# Patient Record
Sex: Male | Born: 1950 | Race: White | Hispanic: No | Smoking: Current every day smoker
Health system: Southern US, Community
[De-identification: ages and names within clinical notes are randomized; demographics above are authoritative.]

## PROBLEM LIST (undated history)

## (undated) DIAGNOSIS — F329 Major depressive disorder, single episode, unspecified: Secondary | ICD-10-CM

## (undated) DIAGNOSIS — C19 Malignant neoplasm of rectosigmoid junction: Secondary | ICD-10-CM

## (undated) DIAGNOSIS — K635 Polyp of colon: Secondary | ICD-10-CM

## (undated) DIAGNOSIS — K769 Liver disease, unspecified: Secondary | ICD-10-CM

## (undated) DIAGNOSIS — T7840XA Allergy, unspecified, initial encounter: Secondary | ICD-10-CM

## (undated) DIAGNOSIS — K7460 Unspecified cirrhosis of liver: Secondary | ICD-10-CM

## (undated) DIAGNOSIS — K5790 Diverticulosis of intestine, part unspecified, without perforation or abscess without bleeding: Secondary | ICD-10-CM

## (undated) DIAGNOSIS — F32A Depression, unspecified: Secondary | ICD-10-CM

## (undated) DIAGNOSIS — M1990 Unspecified osteoarthritis, unspecified site: Secondary | ICD-10-CM

## (undated) HISTORY — PX: HERNIA REPAIR: SHX51

## (undated) HISTORY — PX: COLON SURGERY: SHX602

## (undated) HISTORY — DX: Diverticulosis of intestine, part unspecified, without perforation or abscess without bleeding: K57.90

## (undated) HISTORY — DX: Depression, unspecified: F32.A

## (undated) HISTORY — DX: Major depressive disorder, single episode, unspecified: F32.9

## (undated) HISTORY — DX: Unspecified osteoarthritis, unspecified site: M19.90

## (undated) HISTORY — DX: Allergy, unspecified, initial encounter: T78.40XA

## (undated) HISTORY — DX: Polyp of colon: K63.5

---

## 2005-01-05 ENCOUNTER — Inpatient Hospital Stay: Payer: Self-pay | Admitting: Psychiatry

## 2005-08-14 ENCOUNTER — Emergency Department: Payer: Self-pay | Admitting: General Practice

## 2005-08-14 ENCOUNTER — Inpatient Hospital Stay: Payer: Self-pay | Admitting: Psychiatry

## 2005-09-05 ENCOUNTER — Other Ambulatory Visit: Payer: Self-pay

## 2005-09-05 ENCOUNTER — Emergency Department: Payer: Self-pay | Admitting: Emergency Medicine

## 2005-09-17 ENCOUNTER — Emergency Department: Payer: Self-pay | Admitting: Emergency Medicine

## 2006-05-13 ENCOUNTER — Emergency Department: Payer: Self-pay | Admitting: General Practice

## 2006-08-05 ENCOUNTER — Emergency Department: Payer: Self-pay | Admitting: Emergency Medicine

## 2006-08-21 ENCOUNTER — Other Ambulatory Visit: Payer: Self-pay

## 2006-08-21 ENCOUNTER — Inpatient Hospital Stay: Payer: Self-pay | Admitting: Internal Medicine

## 2007-04-06 ENCOUNTER — Emergency Department: Payer: Self-pay | Admitting: Emergency Medicine

## 2008-07-23 ENCOUNTER — Ambulatory Visit: Payer: Self-pay

## 2010-01-20 ENCOUNTER — Ambulatory Visit: Payer: Self-pay | Admitting: Gastroenterology

## 2010-01-22 ENCOUNTER — Ambulatory Visit: Payer: Self-pay | Admitting: Gastroenterology

## 2010-01-22 LAB — PATHOLOGY REPORT

## 2010-01-25 ENCOUNTER — Ambulatory Visit: Payer: Self-pay | Admitting: Gastroenterology

## 2012-01-02 ENCOUNTER — Ambulatory Visit: Payer: Self-pay | Admitting: Family Medicine

## 2013-08-08 LAB — HM COLONOSCOPY

## 2016-01-06 ENCOUNTER — Telehealth: Payer: Self-pay | Admitting: Family Medicine

## 2016-01-06 NOTE — Telephone Encounter (Signed)
Pt's sister Florentina Jenny would like to see if Dr. [name] can re-establish pt. Pt had moved to [place] and has now moved back. Pt went to hospital in [place] for fluid build up in abdomen and SOB. Pt has Medicare and [employer]. Please advise. Thanks TNP

## 2016-01-06 NOTE — Telephone Encounter (Signed)
Please advise 

## 2016-01-06 NOTE — Telephone Encounter (Signed)
OK to re-establish 

## 2016-01-06 NOTE — Telephone Encounter (Signed)
Spoke with Andrew Carey and scheduled appt for pt to come in for hospital f/u and re-establish. Thanks TNP

## 2016-01-09 ENCOUNTER — Encounter: Payer: Self-pay | Admitting: Urgent Care

## 2016-01-09 ENCOUNTER — Observation Stay
Admission: EM | Admit: 2016-01-09 | Discharge: 2016-01-11 | Disposition: A | Discharge status: Home / Self Care | Payer: Medicare Other | Attending: Specialist | Admitting: Specialist

## 2016-01-09 DIAGNOSIS — R0902 Hypoxemia: Secondary | ICD-10-CM | POA: Diagnosis not present

## 2016-01-09 DIAGNOSIS — Z8673 Personal history of transient ischemic attack (TIA), and cerebral infarction without residual deficits: Secondary | ICD-10-CM | POA: Insufficient documentation

## 2016-01-09 DIAGNOSIS — Z85038 Personal history of other malignant neoplasm of large intestine: Secondary | ICD-10-CM | POA: Insufficient documentation

## 2016-01-09 DIAGNOSIS — Z885 Allergy status to narcotic agent status: Secondary | ICD-10-CM | POA: Diagnosis not present

## 2016-01-09 DIAGNOSIS — Z79899 Other long term (current) drug therapy: Secondary | ICD-10-CM | POA: Diagnosis not present

## 2016-01-09 DIAGNOSIS — F17200 Nicotine dependence, unspecified, uncomplicated: Secondary | ICD-10-CM | POA: Insufficient documentation

## 2016-01-09 DIAGNOSIS — F1010 Alcohol abuse, uncomplicated: Secondary | ICD-10-CM | POA: Insufficient documentation

## 2016-01-09 DIAGNOSIS — R188 Other ascites: Secondary | ICD-10-CM | POA: Diagnosis present

## 2016-01-09 DIAGNOSIS — K7031 Alcoholic cirrhosis of liver with ascites: Principal | ICD-10-CM | POA: Insufficient documentation

## 2016-01-09 HISTORY — DX: Malignant neoplasm of rectosigmoid junction: C19

## 2016-01-09 HISTORY — DX: Liver disease, unspecified: K76.9

## 2016-01-09 HISTORY — DX: Unspecified cirrhosis of liver: K74.60

## 2016-01-09 NOTE — ED Triage Notes (Signed)
Patient presents with c/o SOB and abdominal distention. Patient Dx's with cirrhosis x 4 days ago; Rx'd Lasix. Patient reports worsening symptoms throughout the day. Patient had paracentesis x 4 days ago in [place], [place]; incidental finding of a hepatic lesion noted; patient advised that further workup needed to determine metastasis verus idiopathic etiology given his PMH significance for colorectal cancer.

## 2016-01-09 NOTE — ED Notes (Signed)
Pt. States at around 2 pm today pt. Noticed he was becoming short of breath while watching tv.  Pt. States it continued to get worse throughout day.  Pt. States he had paracentesis done 4 days ago in [facility] where they took off 7 liters of fluid.  Pt. States he was also dx with cirrhosis.

## 2016-01-10 ENCOUNTER — Encounter: Payer: Self-pay | Admitting: Emergency Medicine

## 2016-01-10 ENCOUNTER — Emergency Department: Payer: Medicare Other

## 2016-01-10 LAB — COMPREHENSIVE METABOLIC PANEL
ALK PHOS: 306 U/L — AB (ref 38–126)
ALT: 17 U/L (ref 17–63)
AST: 52 U/L — ABNORMAL HIGH (ref 15–41)
Albumin: 2 g/dL — ABNORMAL LOW (ref 3.5–5.0)
Anion gap: 6 (ref 5–15)
BILIRUBIN TOTAL: 2.6 mg/dL — AB (ref 0.3–1.2)
BUN: 9 mg/dL (ref 6–20)
CALCIUM: 8.1 mg/dL — AB (ref 8.9–10.3)
CO2: 27 mmol/L (ref 22–32)
CREATININE: 0.84 mg/dL (ref 0.61–1.24)
Chloride: 102 mmol/L (ref 101–111)
GFR calc non Af Amer: >60 mL/min (ref >60)
Glucose, Bld: 112 mg/dL — ABNORMAL HIGH (ref 65–99)
Potassium: 3.5 mmol/L (ref 3.5–5.1)
SODIUM: 135 mmol/L (ref 135–145)
TOTAL PROTEIN: 6 g/dL — AB (ref 6.5–8.1)

## 2016-01-10 LAB — TSH: TSH: 11.738 u[IU]/mL — ABNORMAL HIGH (ref 0.350–4.500)

## 2016-01-10 LAB — PROTIME-INR
INR: 1.39
Prothrombin Time: 17.2 seconds — ABNORMAL HIGH (ref 11.4–15.2)

## 2016-01-10 LAB — HEMOGLOBIN A1C: Hgb A1c MFr Bld: 4.2 % (ref 4.0–6.0)

## 2016-01-10 LAB — AMMONIA: Ammonia: 43 umol/L — ABNORMAL HIGH (ref 9–35)

## 2016-01-10 LAB — CBC
HEMATOCRIT: 38.3 % — AB (ref 40.0–52.0)
HEMOGLOBIN: 13.2 g/dL (ref 13.0–18.0)
MCH: 34.4 pg — AB (ref 26.0–34.0)
MCHC: 34.6 g/dL (ref 32.0–36.0)
MCV: 99.3 fL (ref 80.0–100.0)
Platelets: 203 10*3/uL (ref 150–440)
RBC: 3.85 MIL/uL — AB (ref 4.40–5.90)
RDW: 15 % — ABNORMAL HIGH (ref 11.5–14.5)
WBC: 11.7 10*3/uL — ABNORMAL HIGH (ref 3.8–10.6)

## 2016-01-10 MED ORDER — DOCUSATE SODIUM 100 MG PO CAPS
100.0000 mg | ORAL_CAPSULE | Freq: Two times a day (BID) | ORAL | Status: DC
  Administered 2016-01-10: 100 mg via ORAL
  Filled 2016-01-10: qty 1

## 2016-01-10 MED ORDER — MORPHINE SULFATE (PF) 2 MG/ML IV SOLN
2.0000 mg | INTRAVENOUS | Status: DC | PRN
  Administered 2016-01-10 (×3): 2 mg via INTRAVENOUS
  Filled 2016-01-10 (×3): qty 1

## 2016-01-10 MED ORDER — ONDANSETRON HCL 4 MG/2ML IJ SOLN: 4.0000 mg | Freq: Four times a day (QID) | INTRAMUSCULAR | Status: DC | PRN

## 2016-01-10 MED ORDER — LORAZEPAM 2 MG/ML IJ SOLN
1.0000 mg | Freq: Once | INTRAMUSCULAR | Status: AC
  Administered 2016-01-10: 1 mg via INTRAVENOUS

## 2016-01-10 MED ORDER — IBUPROFEN 400 MG PO TABS: 800.0000 mg | ORAL_TABLET | Freq: Four times a day (QID) | ORAL | Status: DC | PRN

## 2016-01-10 MED ORDER — MORPHINE SULFATE (PF) 4 MG/ML IV SOLN
4.0000 mg | Freq: Four times a day (QID) | INTRAVENOUS | Status: DC | PRN
Start: 2016-01-10 — End: 2016-01-11

## 2016-01-10 MED ORDER — LORAZEPAM 1 MG PO TABS
1.0000 mg | ORAL_TABLET | Freq: Four times a day (QID) | ORAL | Status: DC | PRN
Start: 2016-01-10 — End: 2016-01-11
  Administered 2016-01-10: 1 mg via ORAL
  Filled 2016-01-10: qty 1

## 2016-01-10 MED ORDER — SPIRONOLACTONE 100 MG PO TABS
100.0000 mg | ORAL_TABLET | Freq: Every day | ORAL | Status: DC
  Administered 2016-01-10: 100 mg via ORAL
  Filled 2016-01-10: qty 1

## 2016-01-10 MED ORDER — LORAZEPAM 2 MG/ML IJ SOLN
INTRAMUSCULAR | Status: AC
  Administered 2016-01-10: 1 mg via INTRAVENOUS
  Filled 2016-01-10: qty 1

## 2016-01-10 MED ORDER — ONDANSETRON HCL 4 MG PO TABS: 4.0000 mg | ORAL_TABLET | Freq: Four times a day (QID) | ORAL | Status: DC | PRN

## 2016-01-10 MED ORDER — FUROSEMIDE 40 MG PO TABS
40.0000 mg | ORAL_TABLET | Freq: Every day | ORAL | Status: DC
  Administered 2016-01-10: 40 mg via ORAL
  Filled 2016-01-10: qty 1

## 2016-01-10 NOTE — ED Provider Notes (Signed)
[facility] Emergency Department Provider Note  ____________________________________________   None    (approximate)  I have reviewed the triage vital signs and the nursing notes.   HISTORY  Chief Complaint Shortness of Breath and Abdominal Pain   HPI Andrew Carey is a 65 y.o. male with a history of cirrhosis of the liver colorectal cancer and liver lesion recently seen at [facility] in [place] 4 days ago where he had a paracentesis performed patient states "they had to take off a lot". Patient states he informed him that he was moving back here to [facility] and he was told that he needed to be evaluated upon arrival to [facility] which prompted his visit to the emergency department today. Patient states that his abdomen is increased in size since the paracentesis and is larger now than it was before that procedure. Patient also admits to increasing dyspnea worse with laying flat. Patient denies any fever Past Medical History:  Diagnosis Date  . Cirrhosis ([place])   . Colorectal cancer ([place])   . Liver lesion     There are no active problems to display for this patient.   Past Surgical History:  Procedure Laterality Date  . COLON SURGERY    . HERNIA REPAIR      Prior to Admission medications   Medication Sig Start Date End Date Taking? Authorizing Provider  furosemide (LASIX) 40 MG tablet Take 40 mg by mouth daily.   Yes Historical Provider, MD  spironolactone (ALDACTONE) 100 MG tablet Take 100 mg by mouth daily.   Yes Historical Provider, MD    Allergies Codeine  No family history on file.  Social History Social History  Substance Use Topics  . Smoking status: Current Every Day Smoker  . Smokeless tobacco: Never Used  . Alcohol use No    Review of Systems Constitutional: No fever/chills Eyes: No visual changes. ENT: No sore throat. Cardiovascular: Denies chest pain. Respiratory: Positive for shortness of  breath. Gastrointestinal: Positive for abdominal distention and discomfort Genitourinary: Negative for dysuria. Musculoskeletal: Negative for back pain. Skin: Negative for rash. Neurological: Negative for headaches, focal weakness or numbness.  10-point ROS otherwise negative.  ____________________________________________   PHYSICAL EXAM:  VITAL SIGNS: ED Triage Vitals  Enc Vitals Group     BP 01/09/16 2323 103/77     Pulse Rate 01/09/16 2323 (!) 118     Resp 01/09/16 2323 (!) 24     Temp 01/09/16 2323 98.2 F (36.8 C)     Temp Source 01/09/16 2323 Oral     SpO2 01/09/16 2323 93 %     Weight 01/09/16 2324 215 lb (97.5 kg)     Height 01/09/16 2324 6' (1.829 m)     Head Circumference --      Peak Flow --      Pain Score 01/09/16 2324 7     Pain Loc --      Pain Edu? --      Excl. in [place]? --    Constitutional: Alert and oriented. Well appearing and in no acute distress. Eyes: Conjunctivae are normal. Scleral icterus Head: Atraumatic. Ears:  Healthy appearing ear canals and TMs bilaterally Nose: No congestion/rhinnorhea. Mouth/Throat: Mucous membranes are moist.  Oropharynx non-erythematous. Neck: No stridor.  No meningeal signs.   Cardiovascular: Normal rate, regular rhythm. Good peripheral circulation. Grossly normal heart sounds.   Respiratory: Normal respiratory effort.  No retractions. Lungs CTAB. Gastrointestinal: Abdominal distention with positive fluid wave Musculoskeletal: No lower  extremity tenderness nor edema. No gross deformities of extremities. Neurologic:  Normal speech and language. No gross focal neurologic deficits are appreciated.  Skin:  Skin is warm, dry and intact. No rash noted. Positive jaundice Psychiatric: Mood and affect are normal. Speech and behavior are normal.  ____________________________________________   LABS (all labs ordered are listed, but only abnormal results are displayed)  Labs Reviewed  CBC  COMPREHENSIVE METABOLIC PANEL   PROTIME-INR  AMMONIA   ____________________________________________  EKG  ED ECG REPORT I, Harrison, the attending physician, personally viewed and interpreted this ECG.   Date: 01/10/2016  EKG Time: 11:28 PM  Rate: 118  Rhythm: Sinus tachycardia  Axis: Normal  Intervals: Normal  ST&T Change: None    PROCEDURES  Procedure(s) performed:   Procedures     INITIAL IMPRESSION / ASSESSMENT AND PLAN / ED COURSE  Pertinent labs & imaging results that were available during my care of the patient were reviewed by me and considered in my medical decision making (see chart for details).  Given history of physical exam patient discussed with Dr. Marcille Blanco for hospital admission for paracentesis and further evaluation and management.  Clinical Course    ____________________________________________  FINAL CLINICAL IMPRESSION(S) / ED DIAGNOSES  Final diagnoses:  Ascites     MEDICATIONS GIVEN DURING THIS VISIT:  Medications - No data to display   NEW OUTPATIENT MEDICATIONS STARTED DURING THIS VISIT:  New Prescriptions   No medications on file      Note:  This document was prepared using Dragon voice recognition software and may include unintentional dictation errors.    Gregor Hams, MD 01/10/16 [phone]

## 2016-01-10 NOTE — Progress Notes (Signed)
[place] provided Advance Directive education & pastoral prayer as requested.

## 2016-01-10 NOTE — Care Management Obs Status (Signed)
[place] NOTIFICATION   Patient Details  Name: Alexsander Staib MRN: [id number] Date of Birth: 1950/11/05   Medicare Observation Status Notification Given:       Delrae Sawyers, RN 01/10/2016, 8:05 PM

## 2016-01-10 NOTE — Progress Notes (Signed)
[place] at [place] NAME: Andrew Carey    MR#:  [id number]  DATE OF BIRTH:  Oct 19, 1950  SUBJECTIVE:   Pt. Here due to abdominal pain, shortness of breath due to Tense abdominal Ascites.  Still having some abdominal pain.  No fever presently.  Will get US guided Paracentesis tomorrow.   REVIEW OF SYSTEMS:    Review of Systems  Constitutional: Negative for chills and fever.  HENT: Negative for congestion and tinnitus.   Eyes: Negative for blurred vision and double vision.  Respiratory: Positive for shortness of breath. Negative for cough and wheezing.   Cardiovascular: Negative for chest pain, orthopnea and PND.  Gastrointestinal: Positive for abdominal pain. Negative for diarrhea, nausea and vomiting.  Genitourinary: Negative for dysuria and hematuria.  Neurological: Negative for dizziness, sensory change and focal weakness.  All other systems reviewed and are negative.   Nutrition: 2 gram sodium.  Tolerating Diet: Yes Tolerating PT: Await Eval.    DRUG ALLERGIES:   Allergies  Allergen Reactions  . Codeine Nausea And Vomiting    VITALS:  Blood pressure 94/66, pulse 94, temperature 97.7 F (36.5 C), temperature source Oral, resp. rate 20, height 6' (1.829 m), weight 96.8 kg (213 lb 6.4 oz), SpO2 (!) 89 %.  PHYSICAL EXAMINATION:   Physical Exam  GENERAL:  65 y.o.-year-old patient lying in the bed in mild resp. Distress.   EYES: Pupils equal, round, reactive to light and accommodation. No scleral icterus. Extraocular muscles intact.  HEENT: Head atraumatic, normocephalic. Oropharynx and nasopharynx clear.  NECK:  Supple, no jugular venous distention. No thyroid enlargement, no tenderness.  LUNGS: Normal breath sounds bilaterally, no wheezing, rales, rhonchi. No use of accessory muscles of respiration.  CARDIOVASCULAR: S1, S2 normal. II/VI SEM at LSB, No rubs, or gallops.  ABDOMEN: Soft,distended, + fluid wave consistent with  ascites. Bowel sounds present. No organomegaly or mass.  EXTREMITIES: No cyanosis, clubbing, +1 edema b/l.  NEUROLOGIC: Cranial nerves II through XII are intact. No focal Motor or sensory deficits b/l.   PSYCHIATRIC: The patient is alert and oriented x 3.  SKIN: No obvious rash, lesion, or ulcer.    LABORATORY PANEL:   CBC  Recent Labs Lab 01/10/16 0029  WBC 11.7*  HGB 13.2  HCT 38.3*  PLT 203   ------------------------------------------------------------------------------------------------------------------  Chemistries   Recent Labs Lab 01/10/16 0029  NA 135  K 3.5  CL 102  CO2 27  GLUCOSE 112*  BUN 9  CREATININE 0.84  CALCIUM 8.1*  AST 52*  ALT 17  ALKPHOS 306*  BILITOT 2.6*   ------------------------------------------------------------------------------------------------------------------  Cardiac Enzymes No results for input(s): TROPONINI in the last 168 hours. ------------------------------------------------------------------------------------------------------------------  RADIOLOGY:  Dg Chest Portable 1 View  Result Date: 01/10/2016 CLINICAL DATA:  Acute onset of shortness of breath and abdominal distention. Initial encounter. EXAM: PORTABLE CHEST 1 VIEW COMPARISON:  Chest radiograph performed 01/02/2012 FINDINGS: The lungs are well-aerated. Minimal left basilar atelectasis is noted. There is no evidence of focal opacification, pleural effusion or pneumothorax. The cardiomediastinal silhouette is within normal limits. No acute osseous abnormalities are seen. IMPRESSION: Minimal left basilar atelectasis noted. Lungs otherwise grossly clear. Electronically Signed   By: Garald Balding M.D.   On: 01/10/2016 00:56     ASSESSMENT AND PLAN:   65 yo male w/ hx of alcoholic Liver Cirrhosis, hx of colon Cancer, hx of CVA, who presented to the hospital due to abdominal pain/shortness of breath.   1. Abdominal pain -  due to tense ascites from hx of Liver disease.   - will get US guided therapeutic/diagnosis paracentesis tomorrow.  - cont. Supportive care for now.   2. Shortness of breath - due to ascites.  - cont. O2 supplementation.  Await Paracentesis and monitor.   3. 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 Lasix, Aldactone.  - no evidence of encephalopathy.    All the records are reviewed and case discussed with Care Management/Social Workerr. Management plans discussed with the patient, family and they are in agreement.  CODE STATUS: Full Code  DVT Prophylaxis: Ted's & SCD's.   TOTAL TIME TAKING CARE OF THIS PATIENT: 30 minutes.   POSSIBLE D/C IN 1-2 DAYS, DEPENDING ON CLINICAL CONDITION.   Henreitta Leber M.D on 01/10/2016 at 12:05 PM  Between 7am to 6pm - Pager - [phone]  After 6pm go to [email or web address] - password [email or web address] [facility]  Office  [phone]  CC: Primary care physician; Lelon Huh, MD

## 2016-01-10 NOTE — H&P (Signed)
Andrew Carey is an 65 y.o. male.   Chief Complaint: Abdominal pain HPI: The patient with past medical history of cirrhosis presents emergency department complaining of abdominal pain and distention. He states that he recently underwent a paracentesis of 6.3 L and was placed on diuretics but that his abdominal fluid has returned. In the emergency department the patient was found to have a significantly distended belly with positive fluid wave. He was also mildly hypoxic. He denies chest pain but admits that he has to strain in order to take a deep breath due to abdominal distention. Laboratory evaluation revealed mildly elevated INR but was otherwise reassuring. The emergency department staff to call for admission for therapeutic paracentesis.  Past Medical History:  Diagnosis Date  . Cirrhosis ([place])   . Colorectal cancer ([place])   . Liver lesion   . Stroke [facility])     Past Surgical History:  Procedure Laterality Date  . COLON SURGERY    . HERNIA REPAIR      Family History  Problem Relation Age of Onset  . Heart failure Father    Social History:  reports that he has been smoking.  He has never used smokeless tobacco. He reports that he does not drink alcohol. His drug history is not on file.  Allergies:  Allergies  Allergen Reactions  . Codeine Nausea And Vomiting    Medications Prior to Admission  Medication Sig Dispense Refill  . furosemide (LASIX) 40 MG tablet Take 40 mg by mouth daily.    Marland Kitchen spironolactone (ALDACTONE) 100 MG tablet Take 100 mg by mouth daily.      Results for orders placed or performed during the hospital encounter of 01/09/16 (from the past 48 hour(s))  CBC     Status: Abnormal   Collection Time: 01/10/16 12:29 AM  Result Value Ref Range   WBC 11.7 (H) 3.8 - 10.6 K/uL   RBC 3.85 (L) 4.40 - 5.90 MIL/uL   Hemoglobin 13.2 13.0 - 18.0 g/dL   HCT 38.3 (L) 40.0 - 52.0 %   MCV 99.3 80.0 - 100.0 fL   MCH 34.4 (H) 26.0 - 34.0 pg   MCHC 34.6 32.0 - 36.0 g/dL   RDW 15.0 (H) 11.5 - 14.5 %   Platelets 203 150 - 440 K/uL  Comprehensive metabolic panel     Status: Abnormal   Collection Time: 01/10/16 12:29 AM  Result Value Ref Range   Sodium 135 135 - 145 mmol/L   Potassium 3.5 3.5 - 5.1 mmol/L   Chloride 102 101 - 111 mmol/L   CO2 27 22 - 32 mmol/L   Glucose, Bld 112 (H) 65 - 99 mg/dL   BUN 9 6 - 20 mg/dL   Creatinine, Ser 0.84 0.61 - 1.24 mg/dL   Calcium 8.1 (L) 8.9 - 10.3 mg/dL   Total Protein 6.0 (L) 6.5 - 8.1 g/dL   Albumin 2.0 (L) 3.5 - 5.0 g/dL   AST 52 (H) 15 - 41 U/L   ALT 17 17 - 63 U/L   Alkaline Phosphatase 306 (H) 38 - 126 U/L   Total Bilirubin 2.6 (H) 0.3 - 1.2 mg/dL   GFR calc non Af Amer >60 >60 mL/min   GFR calc Af Amer >60 >60 mL/min    Comment: (NOTE) The eGFR has been calculated using the CKD EPI equation. This calculation has not been validated in all clinical situations. eGFR's persistently <60 mL/min signify possible Chronic Kidney Disease.    Anion gap 6 5 - 15  Protime-INR     Status: Abnormal   Collection Time: 01/10/16 12:29 AM  Result Value Ref Range   Prothrombin Time 17.2 (H) 11.4 - 15.2 seconds   INR 1.39   Ammonia     Status: Abnormal   Collection Time: 01/10/16 12:29 AM  Result Value Ref Range   Ammonia 43 (H) 9 - 35 umol/L  TSH     Status: Abnormal   Collection Time: 01/10/16 12:29 AM  Result Value Ref Range   TSH 11.738 (H) 0.350 - 4.500 uIU/mL   Dg Chest Portable 1 View  Result Date: 01/10/2016 CLINICAL DATA:  Acute onset of shortness of breath and abdominal distention. Initial encounter. EXAM: PORTABLE CHEST 1 VIEW COMPARISON:  Chest radiograph performed 01/02/2012 FINDINGS: The lungs are well-aerated. Minimal left basilar atelectasis is noted. There is no evidence of focal opacification, pleural effusion or pneumothorax. The cardiomediastinal silhouette is within normal limits. No acute osseous abnormalities are seen. IMPRESSION: Minimal left basilar atelectasis noted. Lungs otherwise grossly  clear. Electronically Signed   By: Garald Balding M.D.   On: 01/10/2016 00:56    Review of Systems  Constitutional: Negative for chills and fever.  HENT: Negative for sore throat and tinnitus.   Eyes: Negative for blurred vision and redness.  Respiratory: Negative for cough and shortness of breath.   Cardiovascular: Negative for chest pain, palpitations, orthopnea and PND.  Gastrointestinal: Positive for abdominal pain. Negative for diarrhea, nausea and vomiting.  Genitourinary: Negative for dysuria, frequency and urgency.  Musculoskeletal: Negative for joint pain and myalgias.  Skin: Negative for rash.       No lesions  Neurological: Negative for speech change, focal weakness and weakness.  Endo/Heme/Allergies: Does not bruise/bleed easily.       No temperature intolerance  Psychiatric/Behavioral: Negative for depression and suicidal ideas.    Blood pressure 119/79, pulse 94, temperature 97.7 F (36.5 C), temperature source Oral, resp. rate 20, height 6' (1.829 m), weight 96.8 kg (213 lb 6.4 oz), SpO2 97 %. Physical Exam  Nursing note and vitals reviewed. Constitutional: He is oriented to person, place, and time. He appears well-developed and well-nourished. No distress.  HENT:  Head: Normocephalic and atraumatic.  Mouth/Throat: Oropharynx is clear and moist.  Eyes: Conjunctivae and EOM are normal. Pupils are equal, round, and reactive to light. No scleral icterus.  Neck: Normal range of motion. Neck supple. No JVD present. No tracheal deviation present. No thyromegaly present.  Cardiovascular: Normal rate, regular rhythm and normal heart sounds.  Exam reveals no gallop and no friction rub.   No murmur heard. GI: Soft. Bowel sounds are normal. He exhibits distension. He exhibits no mass. There is tenderness. There is no rebound and no guarding.  Genitourinary:  Genitourinary Comments: Deferred  Musculoskeletal: Normal range of motion.  Lymphadenopathy:    He has no cervical  adenopathy.  Neurological: He is alert and oriented to person, place, and time. No cranial nerve deficit.  Skin: Skin is warm and dry. No erythema.  Psychiatric: He has a normal mood and affect. His behavior is normal. Judgment and thought content normal.     Assessment/Plan This is a 65 year old male admitted for abdominal ascites. 1. Large volume ascites: Secondary to cirrhosis due to alcohol. Continue Lasix and spironolactone. We will schedule the patient for ultrasound guided paracentesis. 2. Elevated INR: Secondary to loss of synthetic function for coagulation factors. No active bleeding. No vitamin K necessary at this time. 3. Hypoxia: Mild; secondary to limited excursion due to  abdominal distention. Supplemental oxygen via nasal cannula as needed. 4. DVT prophylaxis: SCDs 5. GI prophylaxis: None The patient is a full code. Time spent on admission orders and patient care approximately 45 minutes  Harrie Foreman, MD 01/10/2016, 7:12 AM

## 2016-01-10 NOTE — ED Notes (Signed)
Patient with history of alcoholic cirrhosis and colorectal cancer in 2005. Currently patient states he came to the ER because he felt short of breath. Denies pain of any kind and denies recent bouts of confusion. Denies N/V/D. Sister, who is caregiver states he has new lesions on the liver, which a CT scan done in [place] stated could be cancer and to followup with MRI. His sister states he has had about 4 falls in the last year. States also having hypotension which could possibly be related to blood pressure medications. Patient's skin is intact, warm/dry and normal for ethinicity. Has PCP lined up and appointment for 08/22 with Dr. [name]. Sister moved patient up from [place] to take care of him. Patient is alert and oriented and able to answer questions appropriately.

## 2016-01-10 NOTE — Progress Notes (Signed)
Dr. Verdell Carmine has rounded on pt, paracentesis to be done tomorrow. Pt has had diet reinstated and is sitting up in bed and eating at this time, family member at bedside. Pt also has scd's in place bilat, feet are tender to touch, heels are floated. Pt stated that pain is gradually beginning to return.

## 2016-01-10 NOTE — Progress Notes (Signed)
Dr. Verdell Carmine paged and responded. This Probation officer reported that pt's bp this am is 94/66, and questioned giving diuretics as ordered this am. Per Dr., this is a liver patient and low blood pressures are to be expected, give diuretics as ordered. This Probation officer also realted that pt is asymptomatic with this bp.

## 2016-01-10 NOTE — ED Notes (Signed)
Transporting patient to room 157

## 2016-01-10 NOTE — ED Notes (Signed)
Dr. Owens Shark into room at family request. Verbal order received for IV medications.

## 2016-01-10 NOTE — Progress Notes (Signed)
Report given to [facility] she will resume care.

## 2016-01-10 NOTE — Progress Notes (Signed)
Shift assessment completed at 0745. Pt is awake, alert and oriented, in no distress. Lungs are clear bilat, decreased to bases. Respirations are unlabored. S1S2 auscultated. Abdomen is distended and firm, Bs hypoactive and tympanic. Pt c/o pain to his lower abdomen 7/10. Pt voided 168mls tea colored urine, denied difficulty with this.PPP, no edema noted. PIV #20 intact to [place] and re taped. Pt received morphine 2 mg ivp at 0745, pain level reassessed at 0805 and pain had decreased to 2/10. Pt placed on O2 at [facility] as room air sat prior to morphine was 91%, pt related that some times morphine will affect his breathing. Pt aware of pending paracentesis for today, is npo at this time. Srx2, phone in reach.

## 2016-01-10 NOTE — Plan of Care (Signed)
Problem: Bowel/Gastric: Goal: Will not experience complications related to bowel motility Outcome: Progressing Pt has denied difficulty withgastric motility, ate with good appetite.

## 2016-01-10 NOTE — Progress Notes (Signed)
Pt requesting Ativan, MD Harrison notified. Orders received for Ativan 1 mg Q6 PRN. Orders placed.

## 2016-01-11 ENCOUNTER — Observation Stay: Payer: Medicare Other

## 2016-01-11 LAB — BASIC METABOLIC PANEL
Anion gap: 6 (ref 5–15)
BUN: 7 mg/dL (ref 6–20)
CALCIUM: 8.4 mg/dL — AB (ref 8.9–10.3)
CO2: 24 mmol/L (ref 22–32)
CREATININE: 0.65 mg/dL (ref 0.61–1.24)
Chloride: 104 mmol/L (ref 101–111)
GFR calc Af Amer: >60 mL/min (ref >60)
GFR calc non Af Amer: >60 mL/min (ref >60)
GLUCOSE: 94 mg/dL (ref 65–99)
Potassium: 3.7 mmol/L (ref 3.5–5.1)
Sodium: 134 mmol/L — ABNORMAL LOW (ref 135–145)

## 2016-01-11 LAB — BODY FLUID CELL COUNT WITH DIFFERENTIAL
Eos, Fluid: 0 %
LYMPHS FL: 47 %
Monocyte-Macrophage-Serous Fluid: 11 %
Neutrophil Count, Fluid: 42 %
WBC FLUID: 154 uL

## 2016-01-11 LAB — GLUCOSE, SEROUS FLUID: GLUCOSE FL: 116 mg/dL

## 2016-01-11 LAB — ALBUMIN, FLUID (OTHER): Albumin, Fluid: <1 g/dL

## 2016-01-11 LAB — PROTEIN, BODY FLUID

## 2016-01-11 MED ORDER — NICOTINE 21 MG/24HR TD PT24
21.0000 mg | MEDICATED_PATCH | Freq: Every day | TRANSDERMAL | Status: DC
  Administered 2016-01-11: 21 mg via TRANSDERMAL
  Filled 2016-01-11: qty 1

## 2016-01-11 MED ORDER — FUROSEMIDE 40 MG PO TABS: 40.0000 mg | ORAL_TABLET | Freq: Every day | ORAL | 1 refills | Status: DC

## 2016-01-11 MED ORDER — SPIRONOLACTONE 100 MG PO TABS: 100.0000 mg | ORAL_TABLET | Freq: Every day | ORAL | 1 refills | Status: DC

## 2016-01-11 MED ORDER — ALBUMIN HUMAN 25 % IV SOLN
25.0000 g | Freq: Once | INTRAVENOUS | Status: AC
  Administered 2016-01-11: 25 g via INTRAVENOUS
  Filled 2016-01-11: qty 100

## 2016-01-11 NOTE — Care Management Obs Status (Signed)
[place] NOTIFICATION   Patient Details  Name: Andrew Carey MRN: [id number] Date of Birth: 02-25-1951   Medicare Observation Status Notification Given:  Yes    Alvie Heidelberg, RN 01/11/2016, 2:26 PM

## 2016-01-11 NOTE — Procedures (Signed)
US guided paracentesis performed without complication.  Removed 9.95 liters of yellow fluid.  See full report in PACS.

## 2016-01-11 NOTE — Progress Notes (Signed)
[facility] [place] NAME: Andrew Carey    MR#:  [id number]  DATE OF BIRTH:  April 07, 1951  SUBJECTIVE:   Severe degenerative abdominal pain and shortness of breath from tense ascites. Status post ultrasound-guided paracentesis with 9.5 L of fluid removed.  REVIEW OF SYSTEMS:    Review of Systems  Constitutional: Negative for chills and fever.  HENT: Negative for congestion and tinnitus.   Eyes: Negative for blurred vision and double vision.  Respiratory: Positive for shortness of breath. Negative for cough and wheezing.   Cardiovascular: Negative for chest pain, orthopnea and PND.  Gastrointestinal: Positive for abdominal pain. Negative for diarrhea, nausea and vomiting.  Genitourinary: Negative for dysuria and hematuria.  Neurological: Negative for dizziness, sensory change and focal weakness.  All other systems reviewed and are negative.   Nutrition: 2 gram sodium.  Tolerating Diet: Yes Tolerating PT: Await Eval.    DRUG ALLERGIES:   Allergies  Allergen Reactions  . Codeine Nausea And Vomiting    VITALS:  Blood pressure (!) 83/55, pulse 93, temperature 97.7 F (36.5 C), temperature source Oral, resp. rate 18, height 6' (1.829 m), weight 97 kg (213 lb 12.8 oz), SpO2 95 %.  PHYSICAL EXAMINATION:   Physical Exam  GENERAL:  65 y.o.-year-old patient lying in the bed in mild resp. Distress.   EYES: Pupils equal, round, reactive to light and accommodation. No scleral icterus. Extraocular muscles intact.  HEENT: Head atraumatic, normocephalic. Oropharynx and nasopharynx clear.  NECK:  Supple, no jugular venous distention. No thyroid enlargement, no tenderness.  LUNGS: Normal breath sounds bilaterally, no wheezing, rales, rhonchi. No use of accessory muscles of respiration.  CARDIOVASCULAR: S1, S2 normal. II/VI SEM at LSB, No rubs, or gallops.  ABDOMEN: Soft,distended, + fluid wave consistent with ascites. Bowel sounds present. No  organomegaly or mass.  EXTREMITIES: No cyanosis, clubbing, +1 edema b/l.  NEUROLOGIC: Cranial nerves II through XII are intact. No focal Motor or sensory deficits b/l.   PSYCHIATRIC: The patient is alert and oriented x 3.  SKIN: No obvious rash, lesion, or ulcer.    LABORATORY PANEL:   CBC  Recent Labs Lab 01/10/16 0029  WBC 11.7*  HGB 13.2  HCT 38.3*  PLT 203   ------------------------------------------------------------------------------------------------------------------  Chemistries   Recent Labs Lab 01/10/16 0029  NA 135  K 3.5  CL 102  CO2 27  GLUCOSE 112*  BUN 9  CREATININE 0.84  CALCIUM 8.1*  AST 52*  ALT 17  ALKPHOS 306*  BILITOT 2.6*   ------------------------------------------------------------------------------------------------------------------  Cardiac Enzymes No results for input(s): TROPONINI in the last 168 hours. ------------------------------------------------------------------------------------------------------------------  RADIOLOGY:  US Paracentesis  Result Date: 01/11/2016 INDICATION: Abdominal ascites. EXAM: ULTRASOUND GUIDED PARACENTESIS MEDICATIONS: None. COMPLICATIONS: None immediate. PROCEDURE: Informed written consent was obtained from the patient after a discussion of the risks, benefits and alternatives to treatment. A timeout was performed prior to the initiation of the procedure. Initial ultrasound scanning demonstrates a large amount of ascites within the right lower abdominal quadrant. The right lower abdomen was prepped and draped in the usual sterile fashion. 1% lidocaine was used for local anesthesia. Following this, a 6 Fr Safe-T-Centesis catheter was introduced. An ultrasound image was saved for documentation purposes. The paracentesis was performed. The catheter was removed and a dressing was applied. The patient tolerated the procedure well without immediate post procedural complication. FINDINGS: A total of approximately  9.95 L of yellow fluid was removed. IMPRESSION: Successful ultrasound-guided paracentesis yielding 9.95 liters  of peritoneal fluid. Electronically Signed   By: Markus Daft M.D.   On: 01/11/2016 12:29   Dg Chest Portable 1 View  Result Date: 01/10/2016 CLINICAL DATA:  Acute onset of shortness of breath and abdominal distention. Initial encounter. EXAM: PORTABLE CHEST 1 VIEW COMPARISON:  Chest radiograph performed 01/02/2012 FINDINGS: The lungs are well-aerated. Minimal left basilar atelectasis is noted. There is no evidence of focal opacification, pleural effusion or pneumothorax. The cardiomediastinal silhouette is within normal limits. No acute osseous abnormalities are seen. IMPRESSION: Minimal left basilar atelectasis noted. Lungs otherwise grossly clear. Electronically Signed   By: Garald Balding M.D.   On: 01/10/2016 00:56     ASSESSMENT AND PLAN:   65 yo male w/ hx of alcoholic Liver Cirrhosis, hx of colon Cancer, hx of CVA, who presented to the hospital due to abdominal pain/shortness of breath.   1. Abdominal pain - due to tense ascites from hx of Liver disease.  - s/p US guided Paracentesis today and 9.5 L of fluid removed.  Albumin given post-procedure.  Clinically feels better. Await fluid studies.   2. Shortness of breath - due to ascites.  - improved post-paracentesis and will monitor.    3. 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 Lasix, Aldactone.  - no evidence of encephalopathy.   If renal function stable later today then can likely d/c home.  Discussed w/ Pt. And nursing staff.   All the records are reviewed and case discussed with Care Management/Social Workerr. Management plans discussed with the patient, family and they are in agreement.  CODE STATUS: Full Code  DVT Prophylaxis: Ted's & SCD's.   TOTAL TIME TAKING CARE OF THIS PATIENT: 30 minutes.   POSSIBLE D/C IN 1-2 DAYS, DEPENDING ON CLINICAL CONDITION.   Henreitta Leber M.D on 01/11/2016 at 3:18  PM  Between 7am to 6pm - Pager - [phone]  After 6pm go to [email or web address] - password [email or web address] [place]  Office  [phone]  CC: Primary care physician; Lelon Huh, MD

## 2016-01-11 NOTE — Progress Notes (Signed)
MD Kiln notified nurse, pt ok to discharge home.

## 2016-01-11 NOTE — Care Management Note (Signed)
Case Management Note  Patient Details  Name: Andrew Carey MRN: [id number] Date of Birth: 1950/06/14  Subjective/Objective:     Spoke with patient and sister Andrew Carey  at the bedside.  Discussed case with Dr Verdell Carmine who agrees that patient may benefit from [place] and CSW. Choice of provider offered to sister and patient. Patient defered decision to sister who chose to go with [facility].    Patient has all DME needed at home. Referral placed with Chaplain servcies for [facility] POA as well. No other CM needs at this time.          Action/Plan: Discharge plan is for home with [facility]  Expected Discharge Date:                  Expected Discharge Plan:  [place].   In-House Referral:     Discharge planning Services  CM Consult  Post Acute Care Choice:    Choice offered to:     DME Arranged:    DME Agency:     HH Arranged:  Social Work, Engineer, materials Agency:     Status of Service:  In process, will continue to follow  If discussed at Long Length of Stay Meetings, dates discussed:    Additional Comments:  Alvie Heidelberg, RN 01/11/2016, 12:40 PM

## 2016-01-12 LAB — MISC LABCORP TEST (SEND OUT): Labcorp test code: 19588

## 2016-01-12 NOTE — Discharge Summary (Signed)
[facility] [place] NAME: Andrew Carey    MR#:  [id number]  DATE OF BIRTH:  Jun 04, 1950  DATE OF ADMISSION:  01/09/2016 ADMITTING PHYSICIAN: Harrie Foreman, MD  DATE OF DISCHARGE: 01/11/2016  6:28 PM  PRIMARY CARE PHYSICIAN: Lelon Huh, MD    ADMISSION DIAGNOSIS:  Ascites [R18.8]  DISCHARGE DIAGNOSIS:  Active Problems:   Ascites due to alcoholic cirrhosis ([place])   SECONDARY DIAGNOSIS:   Past Medical History:  Diagnosis Date  . Cirrhosis ([place])   . Colorectal cancer ([place])   . Liver lesion   . Stroke [facility])     HOSPITAL COURSE:   65 yo male w/ hx of alcoholic Liver Cirrhosis, hx of colon Cancer, hx of CVA, who presented to the hospital due to abdominal pain/shortness of breath.   1. Abdominal pain - due to tense ascites from hx of Liver disease.  - s/p US guided Paracentesis today and 9.5 L of fluid removed.  Albumin given post-procedure.   -Patient's fluid analysis was negative for peritonitis. Patient has clinically improved post-paracentesis and his renal function and hemodynamics is stable and therefore is being discharged home.  2. Shortness of breath - due to ascites.  - improved post-paracentesis  3. Hx of Liver Cirrhosis due to ETOH abuse - he wil cont. Lasix, Aldactone.  - no evidence of encephalopathy while in the hospital.  - referred to GI as outpatient.    DISCHARGE CONDITIONS:   Stable  CONSULTS OBTAINED:    DRUG ALLERGIES:   Allergies  Allergen Reactions  . Codeine Nausea And Vomiting    DISCHARGE MEDICATIONS:     Medication List    TAKE these medications   furosemide 40 MG tablet Commonly known as:  LASIX Take 1 tablet (40 mg total) by mouth daily.   spironolactone 100 MG tablet Commonly known as:  ALDACTONE Take 1 tablet (100 mg total) by mouth daily.         DISCHARGE INSTRUCTIONS:   DIET:  Low-sodium diet  DISCHARGE CONDITION:  Stable  ACTIVITY:  Activity as  tolerated  OXYGEN:  Home Oxygen: No.   Oxygen Delivery: room air  DISCHARGE LOCATION:  home   If you experience worsening of your admission symptoms, develop shortness of breath, life threatening emergency, suicidal or homicidal thoughts you must seek medical attention immediately by calling 911 or calling your MD immediately  if symptoms less severe.  You Must read complete instructions/literature along with all the possible adverse reactions/side effects for all the Medicines you take and that have been prescribed to you. Take any new Medicines after you have completely understood and accpet all the possible adverse reactions/side effects.   Please note  You were cared for by a hospitalist during your hospital stay. If you have any questions about your discharge medications or the care you received while you were in the hospital after you are discharged, you can call the unit and asked to speak with the hospitalist on call if the hospitalist that took care of you is not available. Once you are discharged, your primary care physician will handle any further medical issues. Please note that NO REFILLS for any discharge medications will be authorized once you are discharged, as it is imperative that you return to your primary care physician (or establish a relationship with a primary care physician if you do not have one) for your aftercare needs so that they can reassess your need for medications and monitor your  lab values.   DATA REVIEW:   CBC  Recent Labs Lab 01/10/16 0029  WBC 11.7*  HGB 13.2  HCT 38.3*  PLT 203    Chemistries   Recent Labs Lab 01/10/16 0029 01/11/16 1713  NA 135 134*  K 3.5 3.7  CL 102 104  CO2 27 24  GLUCOSE 112* 94  BUN 9 7  CREATININE 0.84 0.65  CALCIUM 8.1* 8.4*  AST 52*  --   ALT 17  --   ALKPHOS 306*  --   BILITOT 2.6*  --     Cardiac Enzymes No results for input(s): TROPONINI in the last 168 hours.  Microbiology Results  Results for  orders placed or performed during the hospital encounter of 01/09/16  Body fluid culture     Status: None (Preliminary result)   Collection Time: 01/11/16 10:15 AM  Result Value Ref Range Status   Specimen Description PERITONEAL  Final   Special Requests NONE  Final   Gram Stain   Final    RARE WBC PRESENT, PREDOMINANTLY PMN NO ORGANISMS SEEN    Culture   Final    NO GROWTH 1 DAY Performed at [facility]    Report Status PENDING  Incomplete    RADIOLOGY:  US Paracentesis  Result Date: 01/11/2016 INDICATION: Abdominal ascites. EXAM: ULTRASOUND GUIDED PARACENTESIS MEDICATIONS: None. COMPLICATIONS: None immediate. PROCEDURE: Informed written consent was obtained from the patient after a discussion of the risks, benefits and alternatives to treatment. A timeout was performed prior to the initiation of the procedure. Initial ultrasound scanning demonstrates a large amount of ascites within the right lower abdominal quadrant. The right lower abdomen was prepped and draped in the usual sterile fashion. 1% lidocaine was used for local anesthesia. Following this, a 6 Fr Safe-T-Centesis catheter was introduced. An ultrasound image was saved for documentation purposes. The paracentesis was performed. The catheter was removed and a dressing was applied. The patient tolerated the procedure well without immediate post procedural complication. FINDINGS: A total of approximately 9.95 L of yellow fluid was removed. IMPRESSION: Successful ultrasound-guided paracentesis yielding 9.95 liters of peritoneal fluid. Electronically Signed   By: Markus Daft M.D.   On: 01/11/2016 12:29      Management plans discussed with the patient, family and they are in agreement.  CODE STATUS:  Code Status History    Date Active Date Inactive Code Status Order ID Comments User Context   01/10/2016  2:14 AM 01/11/2016  9:33 PM Full Code [id number]  Harrie Foreman, MD Inpatient      TOTAL TIME TAKING CARE OF THIS  PATIENT: 40 minutes.    Henreitta Leber M.D on 01/12/2016 at 4:58 PM  Between 7am to 6pm - Pager - [phone]  After 6pm go to [email or web address] - password [email or web address] [place] [facility]  Office  [phone]  CC: Primary care physician; Lelon Huh, MD

## 2016-01-12 NOTE — Progress Notes (Signed)
Pt requesting nurse to get discharge papers ready, nurse notified pt that MD has not ordered d/c. MD [facility] paged and returned call. MD called into room to speak with pt. MD notified nurse that he will have labs redrawn at 5:00. If labs look ok, pt can d/c.

## 2016-01-12 NOTE — Progress Notes (Signed)
DISCHARGE NOTE:  Pt given discharge instructions and prescriptions. Pt verbalized understanding. Pt wheeled to car by staff.  

## 2016-01-13 LAB — CYTOLOGY - NON PAP

## 2016-01-14 ENCOUNTER — Telehealth: Payer: Self-pay

## 2016-01-14 ENCOUNTER — Telehealth: Payer: Self-pay | Admitting: Family Medicine

## 2016-01-14 DIAGNOSIS — K7682 Hepatic encephalopathy: Secondary | ICD-10-CM | POA: Insufficient documentation

## 2016-01-14 DIAGNOSIS — R109 Unspecified abdominal pain: Secondary | ICD-10-CM | POA: Insufficient documentation

## 2016-01-14 DIAGNOSIS — K7290 Hepatic failure, unspecified without coma: Secondary | ICD-10-CM | POA: Insufficient documentation

## 2016-01-14 LAB — BODY FLUID CULTURE: Culture: NO GROWTH

## 2016-01-14 NOTE — Telephone Encounter (Signed)
Jenny Reichmann called from [facility] to inform you she did not admit pt to services today due to pt returning to [facility] ED for rectal bleeding. Renaldo Fiddler, CMA

## 2016-01-14 NOTE — Telephone Encounter (Signed)
Pt's sister called in saying Andrew Carey needs fluid drained from his abdomin area.  He will have to have this done often and needs a referral from primary.  He has sorosis of the liver.    Florentina Jenny his sister needs him to be seen sooner than his Aug. 22 appt.  Please advise. [phone]  Thanks, Con Memos

## 2016-01-18 NOTE — Telephone Encounter (Signed)
Spoke with pt's sister Florentina Jenny and offered her both appts today. She said thank you but she will keep the appt for 9 am tomorrow. Thanks TNP

## 2016-01-18 NOTE — Progress Notes (Signed)
[facility]  Patient Status: closed, patient refused services    Andrew Carey 01/18/2016, 3:01 PM

## 2016-01-18 NOTE — Telephone Encounter (Signed)
There is a 4:15 slot today, or you can open the 3:30 for him.

## 2016-01-19 ENCOUNTER — Ambulatory Visit (INDEPENDENT_AMBULATORY_CARE_PROVIDER_SITE_OTHER): Payer: Medicare Other | Admitting: Family Medicine

## 2016-01-19 ENCOUNTER — Encounter: Payer: Self-pay | Admitting: Family Medicine

## 2016-01-19 VITALS — BP 66/52 | HR 107 | Temp 97.6°F | Resp 16 | Ht 71.5 in | Wt 182.0 lb

## 2016-01-19 DIAGNOSIS — F410 Panic disorder [episodic paroxysmal anxiety] without agoraphobia: Secondary | ICD-10-CM | POA: Diagnosis not present

## 2016-01-19 DIAGNOSIS — I952 Hypotension due to drugs: Secondary | ICD-10-CM | POA: Diagnosis not present

## 2016-01-19 DIAGNOSIS — K7031 Alcoholic cirrhosis of liver with ascites: Secondary | ICD-10-CM

## 2016-01-19 MED ORDER — ALPRAZOLAM 0.25 MG PO TABS: 0.2500 mg | ORAL_TABLET | Freq: Two times a day (BID) | ORAL | 0 refills | Status: DC | PRN

## 2016-01-19 NOTE — Progress Notes (Signed)
Patient: Andrew Carey Male    DOB: 06/16/1950   65 y.o.   MRN: [id number] Visit Date: 01/19/2016  Today's Provider: Lelon Huh, MD   Chief Complaint  Patient presents with  . Hospitalization Follow-up   Subjective:    HPI  Follow up Hospitalization Patient presents to office today with his sister.  Patient was admitted to [facility] on 01/09/2016 and discharged on 01/11/2016.He was treated for Ascites due to alcoholic Cirrhosis. Treatment for this included performing a Paracentesis. Upon discharge, patient was advised to continue Lasix and Aldactone. Patient was also referred to GI as an out patient.  Patient was readmitted into [facility] on 01/14/2016 and discharged on 0819/2017 for another paracentesis. During this hospitalization spironolactone was doubled to 200 mg a day and furosemide was doubled to 31m a day. He reports good compliance with treatment. He reports this condition is Improved.Although he has felt weak and light headed all week.   ------------------------------------------------------------------------------------ [place] and [facility]   [facility] also consulted and patient is waiting to here from them.   He reports today that he has episodes of feeling very anxious like a panic attack every couple of days. He took Xanax in the past for similar problems which he feels he did well with.      Allergies  Allergen Reactions  . Codeine Nausea And Vomiting   Current Meds  Medication Sig  . furosemide (LASIX) 40 MG tablet Take 2 tablets by mouth daily.  .Marland Kitchenlactulose (CHRONULAC) 10 GM/15ML solution Take 30 mLs by mouth 3 (three) times daily as needed.  .Marland Kitchenspironolactone (ALDACTONE) 100 MG tablet Take 2 tablets by mouth daily.  .Marland Kitchenthiamine 100 MG tablet Take 1 tablet by mouth daily.  . [DISCONTINUED] furosemide (LASIX) 40 MG tablet Take 1  tablet (40 mg total) by mouth daily.  . [DISCONTINUED] spironolactone (ALDACTONE) 100 MG tablet Take 1 tablet (100 mg total) by mouth daily.    Review of Systems  Constitutional: Positive for fatigue. Negative for appetite change, chills and fever.  Respiratory: Positive for shortness of breath. Negative for chest tightness and wheezing.   Cardiovascular: Negative for chest pain and palpitations.  Gastrointestinal: Positive for abdominal distention and abdominal pain. Negative for nausea and vomiting.  Neurological: Positive for dizziness, syncope, weakness and light-headedness.    Social History  Substance Use Topics  . Smoking status: Current Every Day Smoker  . Smokeless tobacco: Never Used  . Alcohol use No   Objective:   BP (!) 66/52 (BP Location: Left Arm, Patient Position: Sitting, Cuff Size: Normal)   Pulse (!) 107   Temp 97.6 F (36.4 C) (Oral)   Resp 16   Ht 5' 11.5" (1.816 m)   Wt 182 lb (82.6 kg)   SpO2 97%   BMI 25.03 kg/m    Vitals:   01/19/16 0925  BP: (!) 66/52  Pulse: (!) 107  Resp: 16  Temp: 97.6 F (36.4 C)  TempSrc: Oral  SpO2: 97%  Weight: 182 lb (82.6 kg)  Height: 5' 11.5" (1.816 m)     Physical Exam   General Appearance:    Alert, cooperative  Eyes:    PERRL, conjunctiva/corneas clear, EOM's intact       Lungs:     Clear to auscultation bilaterally, respirations unlabored  Heart:    Regular rate and  rhythm  Neurologic:   Awake, alert, oriented x 3. No apparent focal neurological           defect.           Assessment & Plan:     1. Hypotension due to drugs Rechecked BP at end of office visit and has come up to 74/58 and patient feeling better. He is not having trouble drinking liquids and counseled to push fluids, mainly water. He is to hold furosemide until BP is over 90/50, and no more than 22m if <100/60.   2. Ascites due to alcoholic cirrhosis (HCC) Improved on higher doses of diuretics but getting very hypotensive as above.    Discussed appropriated protein intake.  [facility] [place]   3. Panic disorder  - ALPRAZolam (XANAX) 0.25 MG tablet; Take 1 tablet (0.25 mg total) by mouth 2 (two) times daily as needed for anxiety.  Dispense: 20 tablet; Refill: 0  Go to ER if unable to keep down adequate liquids of worsening dizziness   The entirety of the information documented in the History of Present Illness, Review of Systems and Physical Exam were personally obtained by me. Portions of this information were initially documented by RMeyer Cory CMA and reviewed by me for thoroughness and accuracy.        DLelon Huh MD  [facility]

## 2016-01-19 NOTE — Patient Instructions (Addendum)
   Do not take furosemide tomorrow (01/20/2016), then resume 40mg  a day if SBP>90 and DBP>50   After that, you should take 80mg  furosemide a day if your SBP>100, and DBP>60    Call my office if home SBP<80 or DBP<50   Diet should include between 65 and 95 grams of protein per day

## 2016-01-21 ENCOUNTER — Telehealth: Payer: Self-pay | Admitting: Family Medicine

## 2016-01-21 DIAGNOSIS — K7031 Alcoholic cirrhosis of liver with ascites: Secondary | ICD-10-CM

## 2016-01-21 NOTE — Telephone Encounter (Signed)
Pt reports his dizziness improving, though he is still experiencing lightheadedness upon standing. States he is no longer falling down due to dizziness. Reports he is waiting for Home Health to come in, and they will provide him with a BP cuff. Has not checked BP yet. Is aware to get labs drawn. Renaldo Fiddler, CMA

## 2016-01-21 NOTE — Telephone Encounter (Signed)
Please check with patient to see if dizziness is improving since putting furosemide on hold and if he has had his BP checked.  Also need labs done before he sees GI next week. Needs met C, CBC, and INR  Order has been left at front desk.

## 2016-02-04 ENCOUNTER — Other Ambulatory Visit: Payer: Self-pay | Admitting: Family Medicine

## 2016-02-04 DIAGNOSIS — F410 Panic disorder [episodic paroxysmal anxiety] without agoraphobia: Secondary | ICD-10-CM

## 2016-02-05 NOTE — Telephone Encounter (Signed)
Please call in alprazolam.  

## 2016-02-08 ENCOUNTER — Other Ambulatory Visit: Payer: Self-pay | Admitting: Family Medicine

## 2016-02-08 DIAGNOSIS — F410 Panic disorder [episodic paroxysmal anxiety] without agoraphobia: Secondary | ICD-10-CM

## 2016-02-08 NOTE — Telephone Encounter (Signed)
Please call in alprazolam.  

## 2016-02-10 ENCOUNTER — Encounter: Payer: Self-pay | Admitting: Family Medicine

## 2016-02-10 ENCOUNTER — Ambulatory Visit (INDEPENDENT_AMBULATORY_CARE_PROVIDER_SITE_OTHER): Payer: Medicare Other | Admitting: Family Medicine

## 2016-02-10 VITALS — BP 78/60 | HR 100 | Temp 97.3°F | Resp 20 | Wt 184.0 lb

## 2016-02-10 DIAGNOSIS — F32A Depression, unspecified: Secondary | ICD-10-CM

## 2016-02-10 DIAGNOSIS — K7031 Alcoholic cirrhosis of liver with ascites: Secondary | ICD-10-CM | POA: Diagnosis not present

## 2016-02-10 DIAGNOSIS — I952 Hypotension due to drugs: Secondary | ICD-10-CM | POA: Diagnosis not present

## 2016-02-10 DIAGNOSIS — F329 Major depressive disorder, single episode, unspecified: Secondary | ICD-10-CM

## 2016-02-10 DIAGNOSIS — F410 Panic disorder [episodic paroxysmal anxiety] without agoraphobia: Secondary | ICD-10-CM

## 2016-02-10 DIAGNOSIS — L299 Pruritus, unspecified: Secondary | ICD-10-CM | POA: Diagnosis not present

## 2016-02-10 MED ORDER — ALPRAZOLAM 0.5 MG PO TABS: 0.5000 mg | ORAL_TABLET | Freq: Two times a day (BID) | ORAL | 5 refills | Status: DC | PRN

## 2016-02-10 MED ORDER — MIRTAZAPINE 7.5 MG PO TABS
7.5000 mg | ORAL_TABLET | Freq: Every day | ORAL | 1 refills | Status: DC
Start: 2016-02-10 — End: 2016-04-12

## 2016-02-10 NOTE — Progress Notes (Signed)
Patient: Andrew Carey Male    DOB: August 14, 1950   65 y.o.   MRN: [id number] Visit Date: 02/10/2016  Today's Provider: Lelon Huh, MD   Chief Complaint  Patient presents with  . Follow-up    hypotension   Subjective:    HPI  Follow up for hypotension  The patient was last seen for this 3 weeks ago. Changes made at last visit include pt advised to hold  Furosemide furosemide for a day and increase fluid intake. He has since been to liver clinic at [facility] is taking 80mg  furosemide daily. He gets light headed when he stands quickly but is using a rollator. He has been very anxious, depressed, and not sleeping well at night. He also complains of itching constantly which also makes it difficult to sleep.   He reports fair compliance with treatment. He feels that condition is Unchanged. He is not having side effects.   ------------------------------------------------------------------------------------   Follow up for anxiety  The patient was last seen for this 3 weeks ago. Changes made at last visit include starting alprazolam.  He reports good compliance with treatment. He feels that condition is Unchanged. He is not having side effects.  Patient reports he is having to take 2 tablet daily due to symptoms.  ------------------------------------------------------------------------------------  Abdominal Pain: Patient complains of abdominal pain. The pain is described as aching and stabbing, and is 4/10 in intensity. Pain is located in the periumbilical bilateral without radiation. Onset was 2 weeks ago. Symptoms have been unchanged since. Aggravating factors: activity.  Alleviating factors: none. Associated symptoms: none. The patient denies constipation, diarrhea, fever, nausea and vomiting.  Patient reports that he is having severe itching all over his body just about every night. Patient reports symptoms worsening since starting 3 weeks ago.  Leg pain: Patient c/o  bilateral leg pain. Patient reports he is not allowed to take OTC medications for pain. He does not tolerate oxycodone due to constipation. He is having loose BM most days since being on lactulose, but still gets a little constipated from time to time.         Allergies  Allergen Reactions  . Codeine Nausea And Vomiting     Current Outpatient Prescriptions:  .  ALPRAZolam (XANAX) 0.25 MG tablet, take 1 tablet by mouth twice a day if needed for anxiety, Disp: 20 tablet, Rfl: 4 .  furosemide (LASIX) 80 MG tablet, take 1 tablet by mouth once daily, Disp: 30 tablet, Rfl: 3 .  lactulose (CHRONULAC) 10 GM/15ML solution, TAKE 30 MILLILITERS BY MOUTH THREE TIMES A DAY AS NEEDED TO ENSURE 3-4 LOOSE BOWEL MOVEMENTS PER DAY, Disp: 900 mL, Rfl: 4 .  RA VITAMIN B-1 100 MG TABS, take 1 tablet by mouth once daily, Disp: 100 tablet, Rfl: 4 .  spironolactone (ALDACTONE) 100 MG tablet, take 2 tablets by mouth once daily, Disp: 30 tablet, Rfl: 0  Review of Systems  Respiratory: Positive for shortness of breath.   Cardiovascular: Negative.   Gastrointestinal: Positive for abdominal pain.  Musculoskeletal: Positive for myalgias.  Skin:       Itching all over every night  Psychiatric/Behavioral: Positive for sleep disturbance. The patient is nervous/anxious.     Social History  Substance Use Topics  . Smoking status: Current Every Day Smoker    Packs/day: 0.50  . Smokeless tobacco: Never Used  . Alcohol use No     Comment: past history of heavy alcohol use   Objective:   BP Marland Kitchen)  78/60 (BP Location: Left Arm, Patient Position: Sitting, Cuff Size: Normal)   Pulse 100   Temp 97.3 F (36.3 C) (Oral)   Resp 20   Wt 184 lb (83.5 kg)   SpO2 98%   BMI 25.31 kg/m   Physical Exam  General appearance: awake, alert, oriented x 3 in no acute distress, thin Head: Normocephalic, without obvious abnormality, atraumatic Respiratory: Respirations even and unlabored, normal respiratory rate Extremities:  No gross deformities Skin: Skin color, texture, turgor normal. No rashes seen  Psych: Appropriate mood and affect. Neurologic: Mental status: Alert, oriented to person, place, and time, thought content appropriate.     Assessment & Plan:     1. Alcoholic cirrhosis of liver with ascites ([place]) Continue routine follow up [facility].   2. Hypotension due to drugs Exacerbated by diuretics. Counseled to check BP every morning and limit to 40mg  if SBP <90.   3. Pruritic condition Secondary to chronic liver disease. Mirtazapine may help with this.   4. Ascites due to alcoholic cirrhosis (HCC) Continue spironolactone. 40-80mg  furosemide as tolerate based on BP  5. Panic disorder Worsening. Increase to 0.5mg  alprazolam.  - ALPRAZolam (XANAX) 0.5 MG tablet; Take 1 tablet (0.5 mg total) by mouth 2 (two) times daily as needed for anxiety.  Dispense: 60 tablet; Refill: 5  6. Depression Advised of potential for hypotension with mirtazapine and monitory BP carefully. Discusses precautions for hypotension. Start lowest dose and recheck in 1 month.  - mirtazapine (REMERON) 7.5 MG tablet; Take 1 tablet (7.5 mg total) by mouth at bedtime.  Dispense: 30 tablet; Refill: 1  Return in about 1 month (around 03/11/2016).       Lelon Huh, MD  [facility]

## 2016-02-10 NOTE — Patient Instructions (Signed)
   Take 1/2 Furosemide (40mg  a day) if SBP<90 and DBP<50   Take 80mg  furosemide a day if your SBP>90, and DBP>50   Call my office if home SBP<80 or DBP<50

## 2016-02-11 ENCOUNTER — Ambulatory Visit: Payer: Medicare Other | Admitting: Family Medicine

## 2016-02-16 ENCOUNTER — Telehealth: Payer: Self-pay | Admitting: Family Medicine

## 2016-02-16 DIAGNOSIS — M2550 Pain in unspecified joint: Secondary | ICD-10-CM

## 2016-02-16 DIAGNOSIS — K7031 Alcoholic cirrhosis of liver with ascites: Secondary | ICD-10-CM

## 2016-02-16 DIAGNOSIS — G8929 Other chronic pain: Secondary | ICD-10-CM | POA: Insufficient documentation

## 2016-02-16 DIAGNOSIS — R109 Unspecified abdominal pain: Secondary | ICD-10-CM

## 2016-02-16 NOTE — Telephone Encounter (Signed)
Jeani Hawking advised and referral ordered. Renaldo Fiddler, CMA

## 2016-02-16 NOTE — Telephone Encounter (Signed)
Please advise. Andrew Carey, CMA  

## 2016-02-16 NOTE — Telephone Encounter (Signed)
OK for 1 additional PT visit. OK referral to pain for chronic abdominal and joint pain with cirrhosis.

## 2016-02-16 NOTE — Telephone Encounter (Signed)
Andrew Carey with [facility] is requesting a verbal order for 1 additional PT visit.  She is asking if Dr [name] would consider a referral to a pain management clinic.  Andrew Carey is also advising pt blood pressure was 96/71 today.  CB#[phone]

## 2016-02-24 ENCOUNTER — Other Ambulatory Visit: Payer: Self-pay | Admitting: Family Medicine

## 2016-02-26 ENCOUNTER — Telehealth: Payer: Self-pay | Admitting: Family Medicine

## 2016-02-26 NOTE — Telephone Encounter (Signed)
error 

## 2016-02-29 DIAGNOSIS — F1021 Alcohol dependence, in remission: Secondary | ICD-10-CM | POA: Diagnosis not present

## 2016-02-29 DIAGNOSIS — K7031 Alcoholic cirrhosis of liver with ascites: Secondary | ICD-10-CM | POA: Diagnosis not present

## 2016-02-29 DIAGNOSIS — K769 Liver disease, unspecified: Secondary | ICD-10-CM | POA: Diagnosis not present

## 2016-02-29 DIAGNOSIS — K7290 Hepatic failure, unspecified without coma: Secondary | ICD-10-CM | POA: Diagnosis not present

## 2016-03-09 ENCOUNTER — Encounter: Payer: Self-pay | Admitting: Family Medicine

## 2016-03-09 ENCOUNTER — Ambulatory Visit (INDEPENDENT_AMBULATORY_CARE_PROVIDER_SITE_OTHER): Payer: Medicare Other | Admitting: Family Medicine

## 2016-03-09 VITALS — BP 98/62 | Temp 98.6°F | Resp 16 | Wt 179.0 lb

## 2016-03-09 DIAGNOSIS — F329 Major depressive disorder, single episode, unspecified: Secondary | ICD-10-CM

## 2016-03-09 DIAGNOSIS — R1011 Right upper quadrant pain: Secondary | ICD-10-CM | POA: Diagnosis not present

## 2016-03-09 DIAGNOSIS — K7031 Alcoholic cirrhosis of liver with ascites: Secondary | ICD-10-CM | POA: Diagnosis not present

## 2016-03-09 DIAGNOSIS — F32A Depression, unspecified: Secondary | ICD-10-CM

## 2016-03-09 MED ORDER — LACTULOSE 10 GM/15ML PO SOLN: ORAL | 4 refills | Status: DC

## 2016-03-09 MED ORDER — OXYCODONE HCL 5 MG PO CAPS: 5.0000 mg | ORAL_CAPSULE | Freq: Three times a day (TID) | ORAL | 0 refills | Status: DC | PRN

## 2016-03-09 NOTE — Progress Notes (Signed)
Patient: Andrew Carey Male    DOB: Apr 20, 1951   65 y.o.   MRN: [id number] Visit Date: 03/09/2016  Today's Provider: Lelon Huh, MD   Chief Complaint  Patient presents with  . Depression  . Anxiety   Subjective:    HPI  Patient comes in today for a follow up on depression and anxiety. Patient was instructed to start Remeron 7.5mg  once daily. He reports that he did not start the medication due to the side effects that he may have. He is wanting to discuss this today.He is mostly concerned about medication stimulating appetite since he already has several BM medications a day on lactulose. He also states he usually has significant pain in the mornings, mainly on the right side of his abdomen. It takes a few hours to get up a going in the mornings.  Patient reports that he has been monitoring his BP. He reports that it is in the 90s/60s. He denies any unusual dizziness or headaches. He also reports that he has not had to take furosemide since last OV.    Allergies  Allergen Reactions  . Codeine Nausea And Vomiting     Current Outpatient Prescriptions:  .  ALPRAZolam (XANAX) 0.5 MG tablet, Take 1 tablet (0.5 mg total) by mouth 2 (two) times daily as needed for anxiety., Disp: 60 tablet, Rfl: 5 .  furosemide (LASIX) 80 MG tablet, take 1 tablet by mouth once daily, Disp: 30 tablet, Rfl: 3 .  lactulose (CHRONULAC) 10 GM/15ML solution, TAKE 30 MILLILITERS BY MOUTH THREE TIMES A DAY AS NEEDED TO ENSURE 3-4 LOOSE BOWEL MOVEMENTS PER DAY, Disp: 900 mL, Rfl: 4 .  RA VITAMIN B-1 100 MG TABS, take 1 tablet by mouth once daily, Disp: 100 tablet, Rfl: 4 .  spironolactone (ALDACTONE) 100 MG tablet, take 2 tablets by mouth once daily, Disp: 60 tablet, Rfl: 5 .  mirtazapine (REMERON) 7.5 MG tablet, Take 1 tablet (7.5 mg total) by mouth at bedtime. (Patient not taking: Reported on 03/09/2016), Disp: 30 tablet, Rfl: 1  Review of Systems  Constitutional: Negative.   Cardiovascular: Negative.     Neurological: Positive for light-headedness.  Psychiatric/Behavioral: The patient is nervous/anxious.     Social History  Substance Use Topics  . Smoking status: Current Every Day Smoker    Packs/day: 0.50  . Smokeless tobacco: Never Used  . Alcohol use No     Comment: past history of heavy alcohol use   Objective:   BP 98/62 (BP Location: Right Arm, Patient Position: Sitting, Cuff Size: Normal)   Temp 98.6 F (37 C)   Resp 16   Wt 179 lb (81.2 kg)   BMI 24.62 kg/m   Physical Exam   General Appearance:    Alert, cooperative, no distress  Eyes:    PERRL, conjunctiva/corneas clear, EOM's intact       Lungs:     Clear to auscultation bilaterally, respirations unlabored  Heart:    Regular rate and rhythm  Neurologic:   Awake, alert, oriented x 3. No apparent focal neurological           defect.           Assessment & Plan:     1. Alcoholic cirrhosis of liver with ascites (HCC) Stable. Continue spironolactone, lactulose, and prn only furosemide.  - lactulose (CHRONULAC) 10 GM/15ML solution; TAKE 30 MILLILITERS BY MOUTH THREE TIMES A DAY AS NEEDED TO ENSURE 3-4 LOOSE BOWEL MOVEMENTS PER DAY  Dispense:  1800 mL; Refill: 4  2. Depression, unspecified depression type Long discussion regarding potential side effects of mirtazapine. He would likely benefit from improved appetite and could reduce lactulose if this increases frequency of BMs. Most importantly, discussed potential for hypotension, so we are starting with low 7.5mg  dose and he needs to check BP daily and let my know if drops below his typical BP  3. Right upper quadrant abdominal pain This seems to be limiting his mobility in the mornings. Will try oxycodone prn. Reminded to never take NSAIDs.  - oxycodone (OXY-IR) 5 MG capsule; Take 1 capsule (5 mg total) by mouth every 8 (eight) hours as needed for pain.  Dispense: 60 capsule; Refill: 0  Return in about 1 month (around 04/09/2016).       Lelon Huh, MD   [facility]

## 2016-04-12 ENCOUNTER — Ambulatory Visit (INDEPENDENT_AMBULATORY_CARE_PROVIDER_SITE_OTHER): Payer: Medicare Other | Admitting: Family Medicine

## 2016-04-12 ENCOUNTER — Encounter: Payer: Self-pay | Admitting: Family Medicine

## 2016-04-12 VITALS — BP 118/80 | HR 96 | Temp 97.8°F | Resp 16 | Wt 183.0 lb

## 2016-04-12 DIAGNOSIS — K7031 Alcoholic cirrhosis of liver with ascites: Secondary | ICD-10-CM

## 2016-04-12 DIAGNOSIS — F32A Depression, unspecified: Secondary | ICD-10-CM

## 2016-04-12 DIAGNOSIS — R1011 Right upper quadrant pain: Secondary | ICD-10-CM

## 2016-04-12 DIAGNOSIS — F329 Major depressive disorder, single episode, unspecified: Secondary | ICD-10-CM | POA: Diagnosis not present

## 2016-04-12 DIAGNOSIS — M2550 Pain in unspecified joint: Secondary | ICD-10-CM

## 2016-04-12 DIAGNOSIS — G8929 Other chronic pain: Secondary | ICD-10-CM | POA: Diagnosis not present

## 2016-04-12 MED ORDER — OXYCODONE HCL 5 MG PO CAPS: 5.0000 mg | ORAL_CAPSULE | Freq: Three times a day (TID) | ORAL | 0 refills | Status: DC | PRN

## 2016-04-12 NOTE — Progress Notes (Signed)
Patient: Andrew Carey Male    DOB: 06-03-1950   65 y.o.   MRN: [id number] Visit Date: 04/12/2016  Today's Provider: Lelon Huh, MD   Chief Complaint  Patient presents with  . Follow-up  . Depression   Subjective:    HPI   Follow up cirrhosis with ascites He reports that abdominal swelling has been well controlled with current medications. Continues to have daily right abdominal pain, but has not been any worse. He takes 5mg  oxycodone every day, and sometimes a second and third dose when his back is bothering him. He continues lactulose and is having normal BMs every day, but they are not loose since starting oxycodone. His sister is with him today and reports no episodes or confusion.  Wt Readings from Last 3 Encounters:  04/12/16 183 lb (83 kg)  03/09/16 179 lb (81.2 kg)  02/10/16 184 lb (83.5 kg)    He has follow up at [facility] December 7th. Is scheduled to have colonoscopy and endoscopy.    Depression, unspecified depression type From 03/09/2016- started low dose mirtazapine 7.5mg  at bedtime and he needs to check BP daily and let us know if drops below his typical BP. He stopped taking medication due to it not seeming to help much, and feels that mood has improved over the last month. His sister feels he has been in better spirits since last visit.    Allergies  Allergen Reactions  . Fluoxetine     Hallucinations  . Codeine Nausea And Vomiting     Current Outpatient Prescriptions:  .  ALPRAZolam (XANAX) 0.5 MG tablet, Take 1 tablet (0.5 mg total) by mouth 2 (two) times daily as needed for anxiety., Disp: 60 tablet, Rfl: 5 .  furosemide (LASIX) 80 MG tablet, take 1 tablet by mouth once daily, Disp: 30 tablet, Rfl: 3 .  lactulose (CHRONULAC) 10 GM/15ML solution, TAKE 30 MILLILITERS BY MOUTH THREE TIMES A DAY AS NEEDED TO ENSURE 3-4 LOOSE BOWEL MOVEMENTS PER DAY, Disp: 1800 mL, Rfl: 4 .  oxycodone (OXY-IR) 5 MG capsule, Take 1 capsule (5 mg total) by mouth every 8  (eight) hours as needed for pain., Disp: 60 capsule, Rfl: 0 .  RA VITAMIN B-1 100 MG TABS, take 1 tablet by mouth once daily, Disp: 100 tablet, Rfl: 4 .  spironolactone (ALDACTONE) 100 MG tablet, take 2 tablets by mouth once daily, Disp: 60 tablet, Rfl: 5 .  mirtazapine (REMERON) 7.5 MG tablet, Take 1 tablet (7.5 mg total) by mouth at bedtime. (Patient not taking: Reported on 04/12/2016), Disp: 30 tablet, Rfl: 1  Review of Systems  Constitutional: Positive for fatigue. Negative for appetite change, chills and fever.  Respiratory: Negative for chest tightness, shortness of breath and wheezing.   Cardiovascular: Negative for chest pain and palpitations.  Gastrointestinal: Positive for abdominal pain. Negative for nausea and vomiting.    Social History  Substance Use Topics  . Smoking status: Current Every Day Smoker    Packs/day: 0.50  . Smokeless tobacco: Never Used  . Alcohol use No     Comment: past history of heavy alcohol use   Objective:   BP 118/80 (BP Location: Left Arm, Patient Position: Sitting, Cuff Size: Normal)   Pulse 96   Temp 97.8 F (36.6 C) (Oral)   Resp 16   Wt 183 lb (83 kg)   SpO2 98%   BMI 25.17 kg/m   Physical Exam   General Appearance:    Alert, cooperative,  no distress  Eyes:    PERRL, conjunctiva/corneas clear, EOM's intact       Lungs:     Clear to auscultation bilaterally, respirations unlabored  Heart:    Regular rate and rhythm  Neurologic:   Awake, alert, oriented x 3. No apparent focal neurological           defect.   Ab:   Mildly distended      Assessment & Plan:     1. Alcoholic cirrhosis of liver with ascites (HCC) Stable. Has not consumed alcohol in nearly 6 months. Follow up [place] [facility] in December as followed.   2. Depression, unspecified depression type Doing better, no longer taking mirtazapine.   3. Chronic joint pain   4. Right upper quadrant abdominal pain Doing well with oxycodone QAM and prn, up to 3 a day.  -  oxycodone (OXY-IR) 5 MG capsule; Take 1 capsule (5 mg total) by mouth every 8 (eight) hours as needed for pain.  Dispense: 60 capsule; Refill: 0   Return in about 3 months (around 07/13/2016).       Lelon Huh, MD  [facility]

## 2016-05-09 ENCOUNTER — Other Ambulatory Visit: Payer: Self-pay | Admitting: Family Medicine

## 2016-05-09 DIAGNOSIS — R1011 Right upper quadrant pain: Secondary | ICD-10-CM

## 2016-05-09 MED ORDER — OXYCODONE HCL 5 MG PO CAPS: 5.0000 mg | ORAL_CAPSULE | Freq: Three times a day (TID) | ORAL | 0 refills | Status: DC | PRN

## 2016-05-09 NOTE — Telephone Encounter (Signed)
Pt needs refill on his  oxycodone (OXY-IR) 5 MG capsule  Thanks Con Memos

## 2016-06-01 ENCOUNTER — Encounter: Payer: Self-pay | Admitting: Family Medicine

## 2016-06-01 ENCOUNTER — Ambulatory Visit (INDEPENDENT_AMBULATORY_CARE_PROVIDER_SITE_OTHER): Payer: Medicare Other | Admitting: Family Medicine

## 2016-06-01 VITALS — BP 120/82 | HR 91 | Temp 97.5°F | Resp 18 | Wt 180.0 lb

## 2016-06-01 DIAGNOSIS — M2550 Pain in unspecified joint: Secondary | ICD-10-CM

## 2016-06-01 DIAGNOSIS — G8929 Other chronic pain: Secondary | ICD-10-CM | POA: Diagnosis not present

## 2016-06-01 DIAGNOSIS — F419 Anxiety disorder, unspecified: Secondary | ICD-10-CM | POA: Diagnosis not present

## 2016-06-01 DIAGNOSIS — R1011 Right upper quadrant pain: Secondary | ICD-10-CM | POA: Diagnosis not present

## 2016-06-01 DIAGNOSIS — F410 Panic disorder [episodic paroxysmal anxiety] without agoraphobia: Secondary | ICD-10-CM | POA: Diagnosis not present

## 2016-06-01 MED ORDER — ALPRAZOLAM 1 MG PO TABS: 1.0000 mg | ORAL_TABLET | Freq: Two times a day (BID) | ORAL | 3 refills | Status: DC | PRN

## 2016-06-01 MED ORDER — OXYCODONE HCL 5 MG PO CAPS: 5.0000 mg | ORAL_CAPSULE | Freq: Three times a day (TID) | ORAL | 0 refills | Status: DC | PRN

## 2016-06-01 NOTE — Progress Notes (Signed)
Patient: Andrew Carey Male    DOB: [birth year]   66 y.o.   MRN: [id number] Visit Date: 06/01/2016  Today's Provider: Lelon Huh, MD   Chief Complaint  Patient presents with  . Anxiety   Subjective:    HPI Anxiety:  Patient comes in today requesting an increase in his medication. Patient has been taking Xanax 0.5mg  1 pill twice daily for anxiety. He states that he has taken 2 at a time on a few occasions and found that he was much less anxious, but not feel sedated.   Wt Readings from Last 3 Encounters:  06/01/16 180 lb (81.6 kg)  04/12/16 183 lb (83 kg)  03/09/16 179 lb (81.2 kg)   BP Readings from Last 3 Encounters:  06/01/16 120/82  04/12/16 118/80  03/09/16 98/62   No alcohol since June 3rd. Anticipates getting on liver transplant list at [place] Reactions  . Fluoxetine     Hallucinations  . Codeine Nausea And Vomiting     Current Outpatient Prescriptions:  .  ALPRAZolam (XANAX) 0.5 MG tablet, Take 1 tablet (0.5 mg total) by mouth 2 (two) times daily as needed for anxiety., Disp: 60 tablet, Rfl: 5 .  furosemide (LASIX) 80 MG tablet, take 1 tablet by mouth once daily, Disp: 30 tablet, Rfl: 3 .  lactulose (CHRONULAC) 10 GM/15ML solution, TAKE 30 MILLILITERS BY MOUTH THREE TIMES A DAY AS NEEDED TO ENSURE 3-4 LOOSE BOWEL MOVEMENTS PER DAY, Disp: 1800 mL, Rfl: 4 .  oxycodone (OXY-IR) 5 MG capsule, Take 1 capsule (5 mg total) by mouth every 8 (eight) hours as needed for pain., Disp: 60 capsule, Rfl: 0 .  RA VITAMIN B-1 100 MG TABS, take 1 tablet by mouth once daily, Disp: 100 tablet, Rfl: 4 .  spironolactone (ALDACTONE) 100 MG tablet, take 2 tablets by mouth once daily, Disp: 60 tablet, Rfl: 5  Review of Systems  Constitutional: Negative for appetite change, chills and fever.  Respiratory: Negative for chest tightness, shortness of breath and wheezing.   Cardiovascular: Negative for chest pain and palpitations.  Gastrointestinal: Negative for  abdominal pain, nausea and vomiting.  Psychiatric/Behavioral: Positive for agitation, confusion, dysphoric mood and suicidal ideas. Negative for hallucinations and self-injury. The patient is nervous/anxious.     Social History  Substance Use Topics  . Smoking status: Current Every Day Smoker    Packs/day: 0.50  . Smokeless tobacco: Never Used  . Alcohol use No     Comment: past history of heavy alcohol use   Objective:   BP 120/82 (BP Location: Right Arm, Patient Position: Sitting, Cuff Size: Normal)   Pulse 91   Temp 97.5 F (36.4 C) (Oral)   Resp 18   Wt 180 lb (81.6 kg)   SpO2 97% Comment: room air  BMI 24.76 kg/m   Physical Exam   General Appearance:    Alert, cooperative, no distress  Eyes:    PERRL, conjunctiva/corneas clear, EOM's intact       Lungs:     Clear to auscultation bilaterally, respirations unlabored  Heart:    Regular rate and rhythm  Neurologic:   Awake, alert, oriented x 3. No apparent focal neurological           defect.           Assessment & Plan:     1. Panic disorder Extensively counseled on increased risk of adverse reaction with underlying liver disease in combination with  benzodiazepines and opioids.Marland Kitchen He is currently havinge no adverse effects but feels anxiety is limiting his ADLs. Discussed alternative classes of medications, but he was intolerant to multiple SSRIs in the past. Will cautiously increase dose to 1mg  twice a day.  - ALPRAZolam (XANAX) 1 MG tablet; Take 1 tablet (1 mg total) by mouth 2 (two) times daily as needed for anxiety.  Dispense: 60 tablet; Refill: 3  2. Acute anxiety Worsening, increase BZ as above.  - ALPRAZolam (XANAX) 1 MG tablet; Take 1 tablet (1 mg total) by mouth 2 (two) times daily as needed for anxiety.  Dispense: 60 tablet; Refill: 3  3. Right upper quadrant abdominal pain Refill oxycodone.  - oxycodone (OXY-IR) 5 MG capsule; Take 1 capsule (5 mg total) by mouth every 8 (eight) hours as needed for pain.   Dispense: 60 capsule; Refill: 0  4. Chronic joint pain      Lelon Huh, MD  [facility]

## 2016-06-14 DIAGNOSIS — F411 Generalized anxiety disorder: Secondary | ICD-10-CM | POA: Insufficient documentation

## 2016-06-20 ENCOUNTER — Other Ambulatory Visit: Payer: Self-pay | Admitting: Family Medicine

## 2016-06-20 DIAGNOSIS — R1011 Right upper quadrant pain: Secondary | ICD-10-CM

## 2016-06-20 MED ORDER — OXYCODONE HCL 5 MG PO CAPS: 5.0000 mg | ORAL_CAPSULE | Freq: Three times a day (TID) | ORAL | 0 refills | Status: DC | PRN

## 2016-06-20 NOTE — Telephone Encounter (Signed)
LOV and refill was 06/01/2016. Renaldo Fiddler, CMA

## 2016-06-20 NOTE — Telephone Encounter (Signed)
Pt needs refill on   oxycodone (OXY-IR) 5 MG capsule  ThanksTeri

## 2016-06-21 ENCOUNTER — Other Ambulatory Visit: Payer: Self-pay | Admitting: Family Medicine

## 2016-06-24 ENCOUNTER — Telehealth: Payer: Self-pay | Admitting: Family Medicine

## 2016-06-24 NOTE — Telephone Encounter (Signed)
Pt is requesting a letter to excuse him from jury duty due to health.  Pt will need this by 07/01/2016/MW

## 2016-06-24 NOTE — Telephone Encounter (Signed)
Please review-aa 

## 2016-06-24 NOTE — Telephone Encounter (Signed)
Done

## 2016-07-04 ENCOUNTER — Ambulatory Visit (INDEPENDENT_AMBULATORY_CARE_PROVIDER_SITE_OTHER): Payer: Medicare Other | Admitting: Family Medicine

## 2016-07-04 ENCOUNTER — Ambulatory Visit
Admission: RE | Admit: 2016-07-04 | Discharge: 2016-07-04 | Disposition: A | Discharge status: Home / Self Care | Payer: Medicare (Managed Care) | Source: Ambulatory Visit | Attending: Family Medicine | Admitting: Family Medicine

## 2016-07-04 ENCOUNTER — Encounter: Payer: Self-pay | Admitting: Family Medicine

## 2016-07-04 VITALS — BP 90/62 | HR 96 | Temp 98.4°F | Resp 18 | Wt 178.0 lb

## 2016-07-04 DIAGNOSIS — R05 Cough: Secondary | ICD-10-CM

## 2016-07-04 DIAGNOSIS — J069 Acute upper respiratory infection, unspecified: Secondary | ICD-10-CM | POA: Diagnosis not present

## 2016-07-04 DIAGNOSIS — R918 Other nonspecific abnormal finding of lung field: Secondary | ICD-10-CM | POA: Diagnosis not present

## 2016-07-04 DIAGNOSIS — R059 Cough, unspecified: Secondary | ICD-10-CM

## 2016-07-04 MED ORDER — LEVOFLOXACIN 500 MG PO TABS
500.0000 mg | ORAL_TABLET | Freq: Every day | ORAL | 0 refills | Status: DC
Start: 2016-07-04 — End: 2016-10-11

## 2016-07-04 NOTE — Progress Notes (Signed)
Patient: Andrew Carey Male    DOB: March 07, 1951   66 y.o.   MRN: [id number] Visit Date: 07/04/2016  Today's Provider: Lelon Huh, MD   Chief Complaint  Patient presents with  . URI   Subjective:    URI   This is a recurrent problem. Episode onset: 1 month ago. The problem has been waxing and waning. There has been no fever. Associated symptoms include congestion, coughing, diarrhea, joint pain, a rash (on face), rhinorrhea, sinus pain, sneezing and a sore throat. Pertinent negatives include no abdominal pain, chest pain, dysuria, ear pain, headaches, joint swelling, nausea, neck pain, plugged ear sensation, vomiting or wheezing. Treatments tried: OTC Cold medication. The treatment provided mild relief.       Allergies  Allergen Reactions  . Fluoxetine     Hallucinations  . Codeine Nausea And Vomiting     Current Outpatient Prescriptions:  .  ALPRAZolam (XANAX) 1 MG tablet, Take 1 tablet (1 mg total) by mouth 2 (two) times daily as needed for anxiety., Disp: 60 tablet, Rfl: 3 .  furosemide (LASIX) 80 MG tablet, take 1 tablet by mouth once daily (Patient taking differently: take 0.5  tablet by mouth once daily), Disp: 30 tablet, Rfl: 5 .  lactulose (CHRONULAC) 10 GM/15ML solution, TAKE 30 MILLILITERS BY MOUTH THREE TIMES A DAY AS NEEDED TO ENSURE 3-4 LOOSE BOWEL MOVEMENTS PER DAY, Disp: 1800 mL, Rfl: 4 .  oxycodone (OXY-IR) 5 MG capsule, Take 1 capsule (5 mg total) by mouth every 8 (eight) hours as needed for pain., Disp: 60 capsule, Rfl: 0 .  RA VITAMIN B-1 100 MG TABS, take 1 tablet by mouth once daily, Disp: 100 tablet, Rfl: 4 .  spironolactone (ALDACTONE) 100 MG tablet, take 2 tablets by mouth once daily, Disp: 60 tablet, Rfl: 5  Review of Systems  Constitutional: Positive for chills, diaphoresis and fatigue. Negative for appetite change and fever.  HENT: Positive for congestion, postnasal drip, rhinorrhea, sinus pain, sinus pressure, sneezing, sore throat and voice  change. Negative for ear pain, mouth sores and nosebleeds.   Respiratory: Positive for cough. Negative for chest tightness, shortness of breath and wheezing.   Cardiovascular: Negative for chest pain and palpitations.  Gastrointestinal: Positive for diarrhea. Negative for abdominal pain, nausea and vomiting.  Genitourinary: Negative for dysuria.  Musculoskeletal: Positive for joint pain and myalgias. Negative for neck pain.  Skin: Positive for rash (on face).  Neurological: Negative for headaches.    Social History  Substance Use Topics  . Smoking status: Current Every Day Smoker    Packs/day: 0.50  . Smokeless tobacco: Never Used  . Alcohol use No     Comment: past history of heavy alcohol use   Objective:   BP 90/62 (BP Location: Right Arm, Patient Position: Sitting, Cuff Size: Normal)   Pulse 96   Temp 98.4 F (36.9 C) (Oral)   Resp 18   Wt 178 lb (80.7 kg)   SpO2 94% Comment: room air  BMI 24.48 kg/m   Physical Exam  General Appearance:    Alert, cooperative, no distress  HENT:   bilateral TM normal without fluid or infection, generalized TM red, dull, bulging, neck without nodes, pharynx erythematous without exudate and nasal mucosa congested  Eyes:    PERRL, conjunctiva/corneas clear, EOM's intact       Lungs:     Clear to auscultation bilaterally, respirations unlabored  Heart:    Regular rate and rhythm  Neurologic:  Awake, alert, oriented x 3. No apparent focal neurological           defect.            Assessment & Plan:     1. Upper respiratory tract infection, unspecified type  - levofloxacin (LEVAQUIN) 500 MG tablet; Take 1 tablet (500 mg total) by mouth daily.  Dispense: 7 tablet; Refill: 0  2. Cough  - DG Chest 2 View; Future       Lelon Huh, MD  [facility] MThe entirety of the information documented in the History of Present Illness, Review of Systems and Physical Exam were personally obtained by me. Portions  of this information were initially documented by Meyer Cory, CMA and reviewed by me for thoroughness and accuracy. [facility]

## 2016-07-04 NOTE — Patient Instructions (Signed)
   You can take OTC Mucinex (guaifenesin) for chest or sinus congestion and to help your cough

## 2016-07-12 ENCOUNTER — Encounter: Payer: Self-pay | Admitting: Family Medicine

## 2016-07-12 ENCOUNTER — Ambulatory Visit (INDEPENDENT_AMBULATORY_CARE_PROVIDER_SITE_OTHER): Payer: Medicare Other | Admitting: Family Medicine

## 2016-07-12 VITALS — BP 92/68 | HR 105 | Temp 98.0°F | Resp 16 | Wt 184.0 lb

## 2016-07-12 DIAGNOSIS — F410 Panic disorder [episodic paroxysmal anxiety] without agoraphobia: Secondary | ICD-10-CM | POA: Diagnosis not present

## 2016-07-12 DIAGNOSIS — M2550 Pain in unspecified joint: Secondary | ICD-10-CM

## 2016-07-12 DIAGNOSIS — R1011 Right upper quadrant pain: Secondary | ICD-10-CM

## 2016-07-12 DIAGNOSIS — F419 Anxiety disorder, unspecified: Secondary | ICD-10-CM | POA: Diagnosis not present

## 2016-07-12 DIAGNOSIS — F329 Major depressive disorder, single episode, unspecified: Secondary | ICD-10-CM | POA: Diagnosis not present

## 2016-07-12 DIAGNOSIS — G8929 Other chronic pain: Secondary | ICD-10-CM | POA: Diagnosis not present

## 2016-07-12 DIAGNOSIS — F32A Depression, unspecified: Secondary | ICD-10-CM

## 2016-07-12 DIAGNOSIS — K7031 Alcoholic cirrhosis of liver with ascites: Secondary | ICD-10-CM | POA: Diagnosis not present

## 2016-07-12 MED ORDER — OXYCODONE HCL 5 MG PO CAPS: 5.0000 mg | ORAL_CAPSULE | Freq: Two times a day (BID) | ORAL | 0 refills | Status: DC | PRN

## 2016-07-12 MED ORDER — ALPRAZOLAM 1 MG PO TABS: 1.0000 mg | ORAL_TABLET | Freq: Three times a day (TID) | ORAL | 3 refills | Status: DC | PRN

## 2016-07-12 NOTE — Progress Notes (Signed)
Patient: Andrew Carey Male    DOB: [birth year]   66 y.o.   MRN: [id number] Visit Date: 07/12/2016  Today's Provider: Lelon Huh, MD   Chief Complaint  Patient presents with  . Panic Attack    follow up  . Anxiety   Subjective:    HPI  Follow up pneumonia Was seen on 07-04-2016 showing  Possible left lower lobe infiltrate  Had labs checked at [facility] a month ago and furosemide was decreased to 1/2 tablet (40mg ) due to low sodium of 129. He states he has had no change or swelling in abdomen since changing dose of medication.   Follow up pf Panic disorder and Anxiety:  Patient was last seen for this problem 1 months ago. Changes made during that visit includes increasing Alprazolam to 1mg  twice daily. Patient reports good compliance with treatment, good tolerance and fair symptom control. Patient states some days he takes 3 pills daily of Alprazolam. He still feels stressed about his physical health.  He states that his abdominal pain has resolved. He still has diffuse arthritic pain every day but feels like he can cut back on oxycodone.      Allergies  Allergen Reactions  . Fluoxetine     Hallucinations  . Codeine Nausea And Vomiting     Current Outpatient Prescriptions:  .  ALPRAZolam (XANAX) 1 MG tablet, Take 1 tablet (1 mg total) by mouth 2 (two) times daily as needed for anxiety., Disp: 60 tablet, Rfl: 3 .  furosemide (LASIX) 80 MG tablet, take 1 tablet by mouth once daily (Patient taking differently: take 0.5  tablet by mouth once daily), Disp: 30 tablet, Rfl: 5 .  lactulose (CHRONULAC) 10 GM/15ML solution, TAKE 30 MILLILITERS BY MOUTH THREE TIMES A DAY AS NEEDED TO ENSURE 3-4 LOOSE BOWEL MOVEMENTS PER DAY, Disp: 1800 mL, Rfl: 4 .  levofloxacin (LEVAQUIN) 500 MG tablet, Take 1 tablet (500 mg total) by mouth daily., Disp: 7 tablet, Rfl: 0 .  oxycodone (OXY-IR) 5 MG capsule, Take 1 capsule (5 mg total) by mouth every 8 (eight) hours as needed for pain., Disp: 60 capsule,  Rfl: 0 .  RA VITAMIN B-1 100 MG TABS, take 1 tablet by mouth once daily, Disp: 100 tablet, Rfl: 4 .  spironolactone (ALDACTONE) 100 MG tablet, take 2 tablets by mouth once daily, Disp: 60 tablet, Rfl: 5  Review of Systems  Constitutional: Negative for appetite change, chills and fever.  Respiratory: Negative for chest tightness, shortness of breath and wheezing.   Cardiovascular: Negative for chest pain and palpitations.  Gastrointestinal: Negative for abdominal pain, nausea and vomiting.  Musculoskeletal: Positive for back pain.  Psychiatric/Behavioral: The patient is nervous/anxious.     Social History  Substance Use Topics  . Smoking status: Current Every Day Smoker    Packs/day: 0.50  . Smokeless tobacco: Never Used  . Alcohol use No     Comment: past history of heavy alcohol use   Objective:   BP 92/68 (BP Location: Left Arm, Patient Position: Sitting, Cuff Size: Normal)   Pulse (!) 105   Temp 98 F (36.7 C) (Oral)   Resp 16   Wt 184 lb (83.5 kg)   SpO2 97% Comment: room air  BMI 25.31 kg/m   Physical Exam   General Appearance:    Alert, cooperative, no distress  Eyes:    PERRL, conjunctiva/corneas clear, EOM's intact       Lungs:     Clear to auscultation  bilaterally, respirations unlabored  Heart:    Regular rate and rhythm  Neurologic:   Awake, alert, oriented x 3. No apparent focal neurological           defect.           Assessment & Plan:     1. Chronic joint pain Will reduce oxycodone to BID - oxycodone (OXY-IR) 5 MG capsule; Take 1 capsule (5 mg total) by mouth 2 (two) times daily as needed for pain.  Dispense: 60 capsule; Refill: 0  2. Alcoholic cirrhosis of liver with ascites (HCC) Stable. Follow up liver clinic at [facility] in April as scheduled. Continues to abstain from alcohol.  3. Panic disorder Increase alprazolam to TID - ALPRAZolam (XANAX) 1 MG tablet; Take 1 tablet (1 mg total) by mouth 3 (three) times daily as needed for anxiety.  Dispense: 90  tablet; Refill: 3  4. Depression, unspecified depression type Doing well not currently requiring any pharmaceuticals.   5. Acute anxiety Will increase alprazolam to TID - ALPRAZolam (XANAX) 1 MG tablet; Take 1 tablet (1 mg total) by mouth 3 (three) times daily as needed for anxiety.  Dispense: 90 tablet; Refill: 3  6. Right upper quadrant abdominal pain Resolved.   Return in about 3 months (around 10/09/2016).       Lelon Huh, MD  [facility]

## 2016-08-08 ENCOUNTER — Other Ambulatory Visit: Payer: Self-pay | Admitting: Family Medicine

## 2016-08-08 DIAGNOSIS — G8929 Other chronic pain: Secondary | ICD-10-CM

## 2016-08-08 DIAGNOSIS — M2550 Pain in unspecified joint: Principal | ICD-10-CM

## 2016-08-08 NOTE — Telephone Encounter (Signed)
Pt contacted office for refill request on the following medications: oxycodone (OXY-IR) 5 MG capsule  Last Rx: 07/12/16 Please advise. Thanks TNP

## 2016-08-09 MED ORDER — OXYCODONE HCL 5 MG PO CAPS: 5.0000 mg | ORAL_CAPSULE | Freq: Two times a day (BID) | ORAL | 0 refills | Status: DC | PRN

## 2016-08-31 ENCOUNTER — Other Ambulatory Visit: Payer: Self-pay | Admitting: Family Medicine

## 2016-08-31 DIAGNOSIS — K7031 Alcoholic cirrhosis of liver with ascites: Secondary | ICD-10-CM

## 2016-08-31 NOTE — Telephone Encounter (Signed)
Patient called about this refill also-aa

## 2016-08-31 NOTE — Telephone Encounter (Signed)
Please review for dr Vincente Liberty

## 2016-09-07 ENCOUNTER — Other Ambulatory Visit: Payer: Self-pay | Admitting: Family Medicine

## 2016-09-07 DIAGNOSIS — M2550 Pain in unspecified joint: Principal | ICD-10-CM

## 2016-09-07 DIAGNOSIS — G8929 Other chronic pain: Secondary | ICD-10-CM

## 2016-09-07 NOTE — Telephone Encounter (Signed)
Pt needs refill to be picked up today.  He said he cant get it filled until later but he has a ride today over here.  oxycodone (OXY-IR) 5 MG capsule  Thanks teri

## 2016-09-08 MED ORDER — OXYCODONE HCL 5 MG PO CAPS: 5.0000 mg | ORAL_CAPSULE | Freq: Two times a day (BID) | ORAL | 0 refills | Status: DC | PRN

## 2016-10-10 ENCOUNTER — Other Ambulatory Visit: Payer: Self-pay | Admitting: Family Medicine

## 2016-10-10 DIAGNOSIS — M2550 Pain in unspecified joint: Principal | ICD-10-CM

## 2016-10-10 DIAGNOSIS — G8929 Other chronic pain: Secondary | ICD-10-CM

## 2016-10-10 MED ORDER — OXYCODONE HCL 5 MG PO CAPS: 5.0000 mg | ORAL_CAPSULE | Freq: Two times a day (BID) | ORAL | 0 refills | Status: DC | PRN

## 2016-10-10 NOTE — Telephone Encounter (Signed)
Pt needs refill on   oxycodone (OXY-IR) 5 MG capsule  Pt is going out of town tomorrow am and would like topick up this afternoon.  Thanks [employer]

## 2016-10-11 ENCOUNTER — Encounter: Payer: Self-pay | Admitting: Family Medicine

## 2016-10-11 ENCOUNTER — Ambulatory Visit (INDEPENDENT_AMBULATORY_CARE_PROVIDER_SITE_OTHER): Payer: Medicare Other | Admitting: Family Medicine

## 2016-10-11 VITALS — BP 124/80 | Temp 98.0°F | Resp 16 | Ht 72.0 in | Wt 198.0 lb

## 2016-10-11 DIAGNOSIS — K7031 Alcoholic cirrhosis of liver with ascites: Secondary | ICD-10-CM | POA: Diagnosis not present

## 2016-10-11 DIAGNOSIS — M2550 Pain in unspecified joint: Secondary | ICD-10-CM

## 2016-10-11 DIAGNOSIS — G8929 Other chronic pain: Secondary | ICD-10-CM

## 2016-10-11 DIAGNOSIS — F419 Anxiety disorder, unspecified: Secondary | ICD-10-CM

## 2016-10-11 DIAGNOSIS — F410 Panic disorder [episodic paroxysmal anxiety] without agoraphobia: Secondary | ICD-10-CM | POA: Diagnosis not present

## 2016-10-11 MED ORDER — OXYCODONE HCL 5 MG PO CAPS: 5.0000 mg | ORAL_CAPSULE | Freq: Two times a day (BID) | ORAL | 0 refills | Status: DC | PRN

## 2016-10-11 MED ORDER — ALPRAZOLAM 1 MG PO TABS: 1.0000 mg | ORAL_TABLET | Freq: Three times a day (TID) | ORAL | 5 refills | Status: DC | PRN

## 2016-10-11 NOTE — Progress Notes (Signed)
Patient: Andrew Carey Male    DOB: 04-Dec-1950   66 y.o.   MRN: [id number] Visit Date: 10/11/2016  Today's Provider: Lelon Huh, MD   Chief Complaint  Patient presents with  . Follow-up  . Anxiety   Subjective:    HPI  Chronic joint pain From 07/12/2016-reduced oxycodone to BID.He missed a step and fell on left side last week and has had some rib pain on left ever since. No dyspnea. Is still taking two tablets most days.   Panic disorder/anxiety From 07/12/2016-Increased alprazolam to TID. Is not having anxiety attacks, but still feels anxious. Usually takes three in a day, occasionally a fourth. Has tried SSRIs in the past which he did not tolerating.        Allergies  Allergen Reactions  . Fluoxetine     Hallucinations  . Codeine Nausea And Vomiting     Current Outpatient Prescriptions:  .  ALPRAZolam (XANAX) 1 MG tablet, Take 1 tablet (1 mg total) by mouth 3 (three) times daily as needed for anxiety., Disp: 90 tablet, Rfl: 3 .  furosemide (LASIX) 80 MG tablet, take 1 tablet by mouth once daily (Patient taking differently: take 0.5  tablet by mouth once daily), Disp: 30 tablet, Rfl: 5 .  lactulose (CHRONULAC) 10 GM/15ML solution, TAKE 30 MILLILITERS BY MOUTH THREE TIMES A DAY IF NEEDED TO, Disp: 1800 mL, Rfl: 4 .  oxycodone (OXY-IR) 5 MG capsule, Take 1 capsule (5 mg total) by mouth 2 (two) times daily as needed for pain., Disp: 60 capsule, Rfl: 0 .  RA VITAMIN B-1 100 MG TABS, take 1 tablet by mouth once daily, Disp: 100 tablet, Rfl: 4 .  spironolactone (ALDACTONE) 100 MG tablet, take 2 tablets by mouth once daily (Patient taking differently: take 1 tablet by mouth once daily), Disp: 60 tablet, Rfl: 5  Review of Systems  Constitutional: Negative for appetite change, chills and fever.  Respiratory: Negative for chest tightness, shortness of breath and wheezing.   Cardiovascular: Negative for chest pain and palpitations.  Gastrointestinal: Negative for  abdominal pain, nausea and vomiting.    Social History  Substance Use Topics  . Smoking status: Current Every Day Smoker    Packs/day: 0.50  . Smokeless tobacco: Never Used  . Alcohol use No     Comment: past history of heavy alcohol use   Objective:   BP 124/80 (BP Location: Right Arm, Patient Position: Sitting, Cuff Size: Large)   Temp 98 F (36.7 C) (Oral)   Resp 16   Ht 6' (1.829 m)   Wt 198 lb (89.8 kg)   BMI 26.85 kg/m  Vitals:   10/11/16 1014  BP: 124/80  Resp: 16  Temp: 98 F (36.7 C)  TempSrc: Oral  Weight: 198 lb (89.8 kg)  Height: 6' (1.829 m)   Depression screen [facility] 2/9 10/11/2016  Decreased Interest 1  Down, Depressed, Hopeless 1  PHQ - 2 Score 2  Altered sleeping 1  Tired, decreased energy 2  Change in appetite 1  Feeling bad or failure about yourself  0  Trouble concentrating 0  Moving slowly or fidgety/restless 0  Suicidal thoughts 0  PHQ-9 Score 6  Difficult doing work/chores Somewhat difficult      Physical Exam  General appearance: alert, well developed, well nourished, cooperative and in no distress Head: Normocephalic, without obvious abnormality, atraumatic Respiratory: Respirations even and unlabored, normal respiratory rate Extremities: No gross deformities Skin: Skin color, texture, turgor normal. No  rashes seen  Psych: Appropriate mood and affect. Neurologic: Mental status: Alert, oriented to person, place, and time, thought content appropriate.     Assessment & Plan:     1. Panic disorder Well controlled.  Continue current medications.   - ALPRAZolam (XANAX) 1 MG tablet; Take 1 tablet (1 mg total) by mouth 3 (three) times daily as needed for anxiety.  Dispense: 90 tablet; Refill: 5  2. Acute anxiety Stable. Continue current medications.   - ALPRAZolam (XANAX) 1 MG tablet; Take 1 tablet (1 mg total) by mouth 3 (three) times daily as needed for anxiety.  Dispense: 90 tablet; Refill: 5  3. Chronic joint pain Doing well on  current regiment of oxycodone.  - oxycodone (OXY-IR) 5 MG capsule; Take 1 capsule (5 mg total) by mouth 2 (two) times daily as needed for pain.  Dispense: 60 capsule; Refill: 0  4. Alcoholic cirrhosis of liver with ascites ([place]) Much better since stopping EtOH consumption last year. Continue regular follow up at [facility] liver clinic.        Lelon Huh, MD  [facility]

## 2016-11-04 ENCOUNTER — Other Ambulatory Visit: Payer: Self-pay | Admitting: Family Medicine

## 2016-11-07 ENCOUNTER — Other Ambulatory Visit: Payer: Self-pay | Admitting: Family Medicine

## 2016-11-07 DIAGNOSIS — M2550 Pain in unspecified joint: Principal | ICD-10-CM

## 2016-11-07 DIAGNOSIS — G8929 Other chronic pain: Secondary | ICD-10-CM

## 2016-11-07 MED ORDER — OXYCODONE HCL 5 MG PO CAPS: 5.0000 mg | ORAL_CAPSULE | Freq: Two times a day (BID) | ORAL | 0 refills | Status: DC | PRN

## 2016-11-07 NOTE — Telephone Encounter (Signed)
Pt is requesting refill on oxycodone (OXY-IR) 5 MG capsule. He would like to pick up tomorrow if possible.Call back # [phone]

## 2016-11-08 ENCOUNTER — Other Ambulatory Visit: Payer: Self-pay | Admitting: Family Medicine

## 2016-12-05 ENCOUNTER — Other Ambulatory Visit: Payer: Self-pay | Admitting: Family Medicine

## 2016-12-05 DIAGNOSIS — G8929 Other chronic pain: Secondary | ICD-10-CM

## 2016-12-05 DIAGNOSIS — M2550 Pain in unspecified joint: Principal | ICD-10-CM

## 2016-12-05 MED ORDER — OXYCODONE HCL 5 MG PO CAPS
5.0000 mg | ORAL_CAPSULE | Freq: Two times a day (BID) | ORAL | 0 refills | Status: DC | PRN
Start: 2016-12-05 — End: 2016-12-22

## 2016-12-05 NOTE — Telephone Encounter (Signed)
Please review. Thanks!  

## 2016-12-05 NOTE — Telephone Encounter (Signed)
pt needs refill on his oxycodone (OXY-IR) 5 MG capsule  Please call when ready  [phone]  Thanks teri

## 2016-12-15 ENCOUNTER — Ambulatory Visit: Payer: Medicare Other | Admitting: Family Medicine

## 2016-12-22 ENCOUNTER — Encounter: Payer: Self-pay | Admitting: Family Medicine

## 2016-12-22 ENCOUNTER — Ambulatory Visit (INDEPENDENT_AMBULATORY_CARE_PROVIDER_SITE_OTHER): Payer: Medicare Other | Admitting: Family Medicine

## 2016-12-22 VITALS — BP 102/70 | HR 70 | Temp 98.0°F | Wt 205.8 lb

## 2016-12-22 DIAGNOSIS — F32A Depression, unspecified: Secondary | ICD-10-CM

## 2016-12-22 DIAGNOSIS — G8929 Other chronic pain: Secondary | ICD-10-CM

## 2016-12-22 DIAGNOSIS — F329 Major depressive disorder, single episode, unspecified: Secondary | ICD-10-CM

## 2016-12-22 DIAGNOSIS — R931 Abnormal findings on diagnostic imaging of heart and coronary circulation: Secondary | ICD-10-CM

## 2016-12-22 DIAGNOSIS — M2550 Pain in unspecified joint: Secondary | ICD-10-CM

## 2016-12-22 DIAGNOSIS — F410 Panic disorder [episodic paroxysmal anxiety] without agoraphobia: Secondary | ICD-10-CM | POA: Diagnosis not present

## 2016-12-22 MED ORDER — OXYCODONE HCL 7.5 MG PO TABS: 7.5000 mg | ORAL_TABLET | Freq: Two times a day (BID) | ORAL | 0 refills | Status: DC | PRN

## 2016-12-22 NOTE — Progress Notes (Signed)
Patient: Andrew Carey Male    DOB: [birth year]   66 y.o.   MRN: [id number] Visit Date: 12/22/2016  Today's Provider: Lelon Huh, MD   Chief Complaint  Patient presents with  . Referral   Subjective:    HPI Patient is here today to discuss cardiology referral. He states he was told approximately 2 years ago that he had elevated coronary calcium score and n enlarged heart when checked in [place]. He states he was told he needed to have this rechecked periodically, due to having a score of 2 on scale of 0-5. He denies chest pain or heart flutters, but does sometimes get short of breath with exertion.   Patient would also like to discuss a referral for counseling services. He states since recently being told about worsening cirrhosis he has been having a hard time dealing with accepting the issue.  He is requesting possible dose increase for both Oxycodone and Alprazolam.    Previous Medications   ALPRAZOLAM (XANAX) 1 MG TABLET    Take 1 tablet (1 mg total) by mouth 3 (three) times daily as needed for anxiety.   FUROSEMIDE (LASIX) 80 MG TABLET    take 1 tablet by mouth once daily   LACTULOSE (CHRONULAC) 10 GM/15ML SOLUTION    TAKE 30 MILLILITERS BY MOUTH THREE TIMES A DAY IF NEEDED TO   OXYCODONE (OXY-IR) 5 MG CAPSULE    Take 1 capsule (5 mg total) by mouth 2 (two) times daily as needed for pain.   RA VITAMIN B-1 100 MG TABS    take 1 tablet by mouth once daily   SPIRONOLACTONE (ALDACTONE) 100 MG TABLET    take 2 tablets by mouth once daily   family history includes Cancer in his mother; Colon polyps in his mother and other; Congestive Heart Failure in his father; Depression in his other; Heart disease in his father; Heart failure in his father; Mental illness in his other; Stroke in his father.   Review of Systems  Constitutional: Negative.   Respiratory: Negative.   Cardiovascular: Negative.   Psychiatric/Behavioral: The patient is nervous/anxious.     Social History  Substance  Use Topics  . Smoking status: Current Every Day Smoker    Packs/day: 0.50  . Smokeless tobacco: Never Used  . Alcohol use No     Comment: past history of heavy alcohol use   Objective:   BP 102/70 (BP Location: Left Arm, Patient Position: Sitting, Cuff Size: Normal)   Pulse 70   Temp 98 F (36.7 C) (Oral)   Wt 205 lb 12.8 oz (93.4 kg)   BMI 27.91 kg/m   Physical Exam  General appearance: alert, well developed, well nourished, cooperative and in no distress Head: Normocephalic, without obvious abnormality, atraumatic Respiratory: Respirations even and unlabored, normal respiratory rate Extremities: No gross deformities Skin: Skin color, texture, turgor normal. No rashes seen  Psych: Appropriate mood and affect. Neurologic: Mental status: Alert, oriented to person, place, and time, thought content appropriate.      Assessment & Plan:     1. Panic disorder Relatively stable. He inquired about increasing alprazolam, however I think this could exacerbate depression and lead to excalating doses of benzodiazepines. Continue current dose for now.   2. Depression, unspecified depression type Refer counseling services.  3. Elevated coronary artery calcium score He does not have copy of original test in [place], but his description of results sounds like he has moderate cardiac risk. Considering family history will refer for follow  up evaluation.   4. Chronic joint pain Only mild relief with oxycodone 5mg , will increase to 7.5 - OxyCODONE HCl 7.5 MG TABA; Take 7.5 mg by mouth 2 (two) times daily as needed.  Dispense: 60 tablet; Refill: 0

## 2017-01-20 ENCOUNTER — Other Ambulatory Visit: Payer: Self-pay

## 2017-01-20 DIAGNOSIS — M2550 Pain in unspecified joint: Principal | ICD-10-CM

## 2017-01-20 DIAGNOSIS — G8929 Other chronic pain: Secondary | ICD-10-CM

## 2017-01-20 MED ORDER — OXYCODONE HCL 7.5 MG PO TABS: 7.5000 mg | ORAL_TABLET | Freq: Two times a day (BID) | ORAL | 0 refills | Status: DC | PRN

## 2017-01-20 NOTE — Telephone Encounter (Signed)
Patient needs refill on Oxycodone and will run out on Sunday. Last fill 12/22/16-aa

## 2017-01-21 ENCOUNTER — Encounter: Payer: Self-pay | Admitting: *Deleted

## 2017-01-21 ENCOUNTER — Emergency Department
Admission: EM | Admit: 2017-01-21 | Discharge: 2017-01-22 | Disposition: A | Discharge status: Home / Self Care | Payer: Medicare (Managed Care) | Attending: Emergency Medicine | Admitting: Emergency Medicine

## 2017-01-21 DIAGNOSIS — Z79899 Other long term (current) drug therapy: Secondary | ICD-10-CM | POA: Diagnosis not present

## 2017-01-21 DIAGNOSIS — F17200 Nicotine dependence, unspecified, uncomplicated: Secondary | ICD-10-CM | POA: Insufficient documentation

## 2017-01-21 DIAGNOSIS — F1010 Alcohol abuse, uncomplicated: Secondary | ICD-10-CM | POA: Insufficient documentation

## 2017-01-21 DIAGNOSIS — R45851 Suicidal ideations: Secondary | ICD-10-CM | POA: Diagnosis not present

## 2017-01-21 LAB — COMPREHENSIVE METABOLIC PANEL
ALK PHOS: 104 U/L (ref 38–126)
ALT: 24 U/L (ref 17–63)
AST: 22 U/L (ref 15–41)
Albumin: 4.1 g/dL (ref 3.5–5.0)
Anion gap: 11 (ref 5–15)
BILIRUBIN TOTAL: 0.7 mg/dL (ref 0.3–1.2)
BUN: 17 mg/dL (ref 6–20)
CALCIUM: 9.2 mg/dL (ref 8.9–10.3)
CO2: 23 mmol/L (ref 22–32)
CREATININE: 0.8 mg/dL (ref 0.61–1.24)
Chloride: 104 mmol/L (ref 101–111)
Glucose, Bld: 93 mg/dL (ref 65–99)
Potassium: 3.7 mmol/L (ref 3.5–5.1)
Sodium: 138 mmol/L (ref 135–145)
TOTAL PROTEIN: 7.8 g/dL (ref 6.5–8.1)

## 2017-01-21 LAB — URINE DRUG SCREEN, QUALITATIVE (ARMC ONLY)
AMPHETAMINES, UR SCREEN: NOT DETECTED
BENZODIAZEPINE, UR SCRN: POSITIVE — AB
Barbiturates, Ur Screen: NOT DETECTED
Cannabinoid 50 Ng, Ur ~~LOC~~: NOT DETECTED
Cocaine Metabolite,Ur ~~LOC~~: NOT DETECTED
MDMA (Ecstasy)Ur Screen: NOT DETECTED
METHADONE SCREEN, URINE: NOT DETECTED
OPIATE, UR SCREEN: NOT DETECTED
Phencyclidine (PCP) Ur S: NOT DETECTED
Tricyclic, Ur Screen: NOT DETECTED

## 2017-01-21 LAB — SALICYLATE LEVEL: Salicylate Lvl: <7 mg/dL (ref 2.8–30.0)

## 2017-01-21 LAB — CBC WITH DIFFERENTIAL/PLATELET
Basophils Absolute: 0.1 10*3/uL (ref 0–0.1)
Basophils Relative: 1 %
Eosinophils Absolute: 0.2 10*3/uL (ref 0–0.7)
Eosinophils Relative: 1 %
HCT: 45.5 % (ref 40.0–52.0)
HEMOGLOBIN: 16.1 g/dL (ref 13.0–18.0)
LYMPHS ABS: 3 10*3/uL (ref 1.0–3.6)
LYMPHS PCT: 24 %
MCH: 31.8 pg (ref 26.0–34.0)
MCHC: 35.5 g/dL (ref 32.0–36.0)
MCV: 89.6 fL (ref 80.0–100.0)
MONOS PCT: 9 %
Monocytes Absolute: 1.1 10*3/uL — ABNORMAL HIGH (ref 0.2–1.0)
NEUTROS PCT: 65 %
Neutro Abs: 8 10*3/uL — ABNORMAL HIGH (ref 1.4–6.5)
Platelets: 163 10*3/uL (ref 150–440)
RBC: 5.08 MIL/uL (ref 4.40–5.90)
RDW: 14 % (ref 11.5–14.5)
WBC: 12.3 10*3/uL — AB (ref 3.8–10.6)

## 2017-01-21 LAB — URINALYSIS, COMPLETE (UACMP) WITH MICROSCOPIC
BACTERIA UA: NONE SEEN
Bilirubin Urine: NEGATIVE
Glucose, UA: NEGATIVE mg/dL
Hgb urine dipstick: NEGATIVE
KETONES UR: NEGATIVE mg/dL
LEUKOCYTES UA: NEGATIVE
Nitrite: NEGATIVE
PH: 6 (ref 5.0–8.0)
Protein, ur: NEGATIVE mg/dL
Specific Gravity, Urine: 1.004 — ABNORMAL LOW (ref 1.005–1.030)

## 2017-01-21 LAB — ETHANOL: ALCOHOL ETHYL (B): 286 mg/dL — AB (ref <5)

## 2017-01-21 LAB — ACETAMINOPHEN LEVEL

## 2017-01-21 LAB — MAGNESIUM: MAGNESIUM: 1.9 mg/dL (ref 1.7–2.4)

## 2017-01-21 MED ORDER — LORAZEPAM 2 MG PO TABS: 0.0000 mg | ORAL_TABLET | Freq: Two times a day (BID) | ORAL | Status: DC

## 2017-01-21 MED ORDER — THIAMINE HCL 100 MG/ML IJ SOLN
100.0000 mg | Freq: Once | INTRAMUSCULAR | Status: AC
  Administered 2017-01-21: 100 mg via INTRAVENOUS
  Filled 2017-01-21: qty 2

## 2017-01-21 MED ORDER — LORAZEPAM 2 MG PO TABS: 0.0000 mg | ORAL_TABLET | Freq: Four times a day (QID) | ORAL | Status: DC

## 2017-01-21 MED ORDER — NICOTINE 21 MG/24HR TD PT24
21.0000 mg | MEDICATED_PATCH | Freq: Once | TRANSDERMAL | Status: DC
  Administered 2017-01-21: 21 mg via TRANSDERMAL
  Filled 2017-01-21: qty 1

## 2017-01-21 MED ORDER — LORAZEPAM 2 MG/ML IJ SOLN: 0.0000 mg | Freq: Four times a day (QID) | INTRAMUSCULAR | Status: DC

## 2017-01-21 MED ORDER — SODIUM CHLORIDE 0.9 % IV BOLUS (SEPSIS)
500.0000 mL | Freq: Once | INTRAVENOUS | Status: AC
  Administered 2017-01-21: 500 mL via INTRAVENOUS

## 2017-01-21 MED ORDER — LORAZEPAM 2 MG/ML IJ SOLN: 0.0000 mg | Freq: Two times a day (BID) | INTRAMUSCULAR | Status: DC

## 2017-01-21 MED ORDER — SODIUM CHLORIDE 0.9 % IV SOLN
1.0000 mg | Freq: Once | INTRAVENOUS | Status: AC
  Administered 2017-01-21: 1 mg via INTRAVENOUS
  Filled 2017-01-21: qty 0.2

## 2017-01-21 NOTE — ED Notes (Signed)
Sitter is with pt

## 2017-01-21 NOTE — ED Notes (Signed)
While doing patients assessment pt states he needs to talk to someone because he is having thoughts of harming himself. Pt does not tell this RN a specific plan. Pt also requesting food and something to drink and a nicotine patch. Will ask MD for orders.

## 2017-01-21 NOTE — BH Assessment (Signed)
Pt intoxicated and unable to completely participate in assessment.  TTS will evaluate once pt is awake and coherent.

## 2017-01-21 NOTE — ED Notes (Signed)
Pt asking to speak with a Engineer, structural. [employer] Officer Pride to room and speaking with pt.

## 2017-01-21 NOTE — ED Notes (Signed)
Pt asking to leave. Dr Burlene Arnt informed.

## 2017-01-21 NOTE — ED Notes (Signed)
BEHAVIORAL HEALTH ROUNDING  Patient sleeping: No.  Patient alert and oriented: yes  Behavior appropriate: Yes. ; If no, describe:  Nutrition and fluids offered: Yes  Toileting and hygiene offered: Yes  Sitter present: not applicable, Q 15 min safety rounds and observation.  Law enforcement present: Yes ODS  

## 2017-01-21 NOTE — ED Notes (Signed)
Report given to SOC MD.  

## 2017-01-21 NOTE — ED Provider Notes (Signed)
[facility] Emergency Department Provider Note  ____________________________________________   I have reviewed the triage vital signs and the nursing notes.   HISTORY  Chief Complaint Alcohol Intoxication and Suicidal    HPI Dean Goldner is a 66 y.o. male who presents today saying that he was drunk and the thought crossed his mind that he might want to hurt himself. However, he thought about his family and decided that he did not want to do it. However he is brought in here for further evaluation. Patient does not regularly drink. This is an exception. He states he's been sober for a year. He denies any injury. He denies any ingestion. He states his regular dose of [place] and was taken but he did not try to overdose     Past Medical History:  Diagnosis Date  . Allergy   . Arthritis   . Cirrhosis ([place])   . Colon polyps   . Colorectal cancer ([place])   . Depression   . Diverticulosis   . Liver lesion   . Stroke [facility])     Patient Active Problem List   Diagnosis Date Noted  . Elevated coronary artery calcium score 12/22/2016  . GAD (generalized anxiety disorder) 06/14/2016  . Chronic joint pain 02/16/2016  . Depression 02/10/2016  . Pruritic condition 02/10/2016  . Panic disorder 02/10/2016  . Alcoholic cirrhosis of liver with ascites ([place]) 02/10/2016  . Hypotension due to drugs 01/19/2016    Past Surgical History:  Procedure Laterality Date  . COLON SURGERY    . HERNIA REPAIR      Prior to Admission medications   Medication Sig Start Date End Date Taking? Authorizing Provider  ALPRAZolam Duanne Moron) 1 MG tablet Take 1 tablet (1 mg total) by mouth 3 (three) times daily as needed for anxiety. 10/11/16   Birdie Sons, MD  furosemide (LASIX) 80 MG tablet take 1 tablet by mouth once daily Patient taking differently: take 0.5  tablet by mouth once daily 06/21/16   Birdie Sons, MD  lactulose (CHRONULAC) 10 GM/15ML solution TAKE 30 MILLILITERS BY  MOUTH THREE TIMES A DAY IF NEEDED TO 09/01/16   Trinna Post, PA-C  OxyCODONE HCl 7.5 MG TABA Take 7.5 mg by mouth 2 (two) times daily as needed. 01/20/17   Birdie Sons, MD  RA VITAMIN B-1 100 MG TABS take 1 tablet by mouth once daily 02/04/16   Birdie Sons, MD  spironolactone (ALDACTONE) 100 MG tablet take 2 tablets by mouth once daily 11/08/16   Birdie Sons, MD    Allergies Fluoxetine and Codeine  Family History  Problem Relation Age of Onset  . Heart failure Father   . Congestive Heart Failure Father   . Heart disease Father   . Stroke Father   . Cancer Mother   . Colon polyps Mother   . Colon polyps Other   . Depression Other   . Mental illness Other     Social History Social History  Substance Use Topics  . Smoking status: Current Every Day Smoker    Packs/day: 0.50  . Smokeless tobacco: Never Used  . Alcohol use Yes    Review of Systems Constitutional: No fever/chills Eyes: No visual changes. ENT: No sore throat. No stiff neck no neck pain Cardiovascular: Denies chest pain. Respiratory: Denies shortness of breath. Gastrointestinal:   no vomiting.  No diarrhea.  No constipation. Genitourinary: Negative for dysuria. Musculoskeletal: Negative lower extremity swelling Skin: Negative for rash. Neurological: Negative  for severe headaches, focal weakness or numbness.   ____________________________________________   PHYSICAL EXAM:  VITAL SIGNS: ED Triage Vitals  Enc Vitals Group     BP 01/21/17 1742 (!) 124/92     Pulse Rate 01/21/17 1742 85     Resp 01/21/17 1742 18     Temp 01/21/17 2115 98.1 F (36.7 C)     Temp Source 01/21/17 2115 Oral     SpO2 01/21/17 1742 93 %     Weight 01/21/17 1743 200 lb 9.9 oz (91 kg)     Height 01/21/17 1743 6' (1.829 m)     Head Circumference --      Peak Flow --      Pain Score 01/21/17 1742 9     Pain Loc --      Pain Edu? --      Excl. in [place]? --     Constitutional: Alert and oriented. Well appearing  and in no acute distress.positive alcohol clinically Eyes: Conjunctivae are normal Head: Atraumatic HEENT: No congestion/rhinnorhea. Mucous membranes are moist.  Oropharynx non-erythematous Neck:   Nontender with no meningismus, no masses, no stridor Cardiovascular: Normal rate, regular rhythm. Grossly normal heart sounds.  Good peripheral circulation. Respiratory: Normal respiratory effort.  No retractions. Lungs CTAB. Abdominal: Soft and nontender. No distention. No guarding no rebound Back:  There is no focal tenderness or step off.  there is no midline tenderness there are no lesions noted. there is no CVA tenderness Musculoskeletal: No lower extremity tenderness, no upper extremity tenderness. No joint effusions, no DVT signs strong distal pulses no edema Neurologic:  Normal speech and language. No gross focal neurologic deficits are appreciated.  Skin:  Skin is warm, dry and intact. No rash noted. Psychiatric: Mood and affect are normal. Speech and behavior are normal.  ____________________________________________   LABS (all labs ordered are listed, but only abnormal results are displayed)  Labs Reviewed  CBC WITH DIFFERENTIAL/PLATELET - Abnormal; Notable for the following:       Result Value   WBC 12.3 (*)    Neutro Abs 8.0 (*)    Monocytes Absolute 1.1 (*)    All other components within normal limits  ETHANOL - Abnormal; Notable for the following:    Alcohol, Ethyl (B) 286 (*)    All other components within normal limits  ACETAMINOPHEN LEVEL - Abnormal; Notable for the following:    Acetaminophen (Tylenol), Serum <10 (*)    All other components within normal limits  URINALYSIS, COMPLETE (UACMP) WITH MICROSCOPIC - Abnormal; Notable for the following:    Color, Urine STRAW (*)    APPearance CLEAR (*)    Specific Gravity, Urine 1.004 (*)    Squamous Epithelial / LPF 0-5 (*)    All other components within normal limits  URINE DRUG SCREEN, QUALITATIVE ([facility] ONLY) - Abnormal;  Notable for the following:    Benzodiazepine, Ur Scrn POSITIVE (*)    All other components within normal limits  COMPREHENSIVE METABOLIC PANEL  SALICYLATE LEVEL  MAGNESIUM   ____________________________________________  EKG  I personally interpreted any EKGs ordered by me or triage  ____________________________________________  RADIOLOGY  I reviewed any imaging ordered by me or triage that were performed during my shift and, if possible, patient and/or family made aware of any abnormal findings. ____________________________________________   PROCEDURES  Procedure(s) performed: None  Procedures  Critical Care performed: None  ____________________________________________   INITIAL IMPRESSION / ASSESSMENT AND PLAN / ED COURSE  Pertinent labs & imaging results that  were available during my care of the patient were reviewed by me and considered in my medical decision making (see chart for details).  Patient here with some thoughts, while intoxicated, of self-harm however as he sobers up he refuses to endorse that and fact he contracts for safety. I have had him evaluated by psychiatry they feel he is not a danger to self and they do advise reversing the IVC which I took out when he initially came in to prevent him from leaving until he could sort this out. They will reverse his IVC, patient is in no acute distress he continues to contract for safety he does have outpatient follow-up and he will return for new or worrisome symptoms. He has no SI or HI and discharge    ____________________________________________   FINAL CLINICAL IMPRESSION(S) / ED DIAGNOSES  Final diagnoses:  None      This chart was dictated using voice recognition software.  Despite best efforts to proofread,  errors can occur which can change meaning.      Schuyler Amor, MD 01/21/17 [id number]

## 2017-01-21 NOTE — ED Triage Notes (Signed)
Pt to ED via EMS intoxicated and verbalizing "I don't give a fuck if I die." Pt reports having had half of a fifth of rum.   Pt in NAD at time of arrival.

## 2017-01-21 NOTE — ED Notes (Signed)

## 2017-01-22 NOTE — ED Notes (Signed)
BEHAVIORAL HEALTH ROUNDING  Patient sleeping: No.  Patient alert and oriented: yes  Behavior appropriate: Yes. ; If no, describe:  Nutrition and fluids offered: Yes  Toileting and hygiene offered: Yes  Sitter present: not applicable, Q 15 min safety rounds and observation.  Law enforcement present: Yes ODS  

## 2017-01-22 NOTE — ED Notes (Signed)
Pt given phone to try to call for a ride.

## 2017-01-22 NOTE — BH Assessment (Signed)
Patient evaluated by Specialist on Call and does not meet criteria for inpatient criteria.  IVC has been rescinded.  Pt to be discharged.  Pt is currently trying to find a ride home.

## 2017-01-29 ENCOUNTER — Other Ambulatory Visit: Payer: Self-pay | Admitting: Physician Assistant

## 2017-01-29 DIAGNOSIS — K7031 Alcoholic cirrhosis of liver with ascites: Secondary | ICD-10-CM

## 2017-01-31 NOTE — Telephone Encounter (Signed)
Pt needing refill lactulose [facility]) 10 GM/15ML solution  [place]

## 2017-01-31 NOTE — Telephone Encounter (Signed)
Please review. Thanks!  

## 2017-02-15 ENCOUNTER — Telehealth: Payer: Self-pay

## 2017-02-15 NOTE — Telephone Encounter (Signed)
Patient called today upset about his cirrhosis of his liver. Patient reports that he has been feeling   frightened, lataurgic and "scared of dying" patient also reports feeling nausea every time after he   eats. Patient reports that he has had thoughts about suicide but denies hurting himself or anyone   else. Patient reports that he is in close contact with his sister Toribio Harbour and Wallie Renshaw   (daughter.) Patient reports that his sister Florentina Jenny has scheduled an appointment with behavioral but is   not scheduled until 04/03/2017. Patient states he does not want to go to the ER. Patient reports that   he called [facility] to let them know that they are "fired". Patient reports that he feels like   they are not doing enough to get him on the waiting list. Patient reports he has been very upset since   finding out about his condition and reports that he has been having a lot of "panic attacks." Patient reports taking Xanax 1 tablet 3 times daily.   Patient reports that he has been in contact with hospice and has been talking to [facility] and [facility].   Patient reports Sheryl from Hospice is to contact us for adittion visits. Patient reports he "just needs   someone to talk too.)  CB# [phone]   Please advise. sd

## 2017-02-15 NOTE — Telephone Encounter (Signed)
Patient advised as below.  

## 2017-02-15 NOTE — Telephone Encounter (Signed)
He should schedule office visit to come in a talk about things.

## 2017-02-16 ENCOUNTER — Telehealth: Payer: Self-pay | Admitting: Family Medicine

## 2017-02-16 NOTE — Telephone Encounter (Signed)
Deborah with Hospice called saying the patient is requesting in home palitive care.  She said she has already faxed the order it just needs to signed  Thank sTeri

## 2017-02-16 NOTE — Telephone Encounter (Signed)
Hospice called back saying they need demographic info and last office notes  Fax to [phone]  [facility]

## 2017-02-16 NOTE — Telephone Encounter (Signed)
FYI--Pt cancelled appointment to cardiology for elevated coronary calcium score stating that he was unable to go because he has to many other doctor's appointments

## 2017-02-17 ENCOUNTER — Emergency Department
Admission: EM | Admit: 2017-02-17 | Discharge: 2017-02-17 | Disposition: A | Discharge status: Home / Self Care | Payer: Medicare (Managed Care) | Attending: Emergency Medicine | Admitting: Emergency Medicine

## 2017-02-17 ENCOUNTER — Ambulatory Visit (INDEPENDENT_AMBULATORY_CARE_PROVIDER_SITE_OTHER): Payer: Medicare Other | Admitting: Family Medicine

## 2017-02-17 ENCOUNTER — Encounter: Payer: Self-pay | Admitting: Family Medicine

## 2017-02-17 ENCOUNTER — Encounter: Payer: Self-pay | Admitting: Emergency Medicine

## 2017-02-17 VITALS — BP 120/68 | HR 72 | Temp 98.4°F | Resp 16 | Ht 72.0 in | Wt 204.0 lb

## 2017-02-17 DIAGNOSIS — F329 Major depressive disorder, single episode, unspecified: Secondary | ICD-10-CM

## 2017-02-17 DIAGNOSIS — G8929 Other chronic pain: Secondary | ICD-10-CM

## 2017-02-17 DIAGNOSIS — Z7289 Other problems related to lifestyle: Secondary | ICD-10-CM

## 2017-02-17 DIAGNOSIS — F1010 Alcohol abuse, uncomplicated: Secondary | ICD-10-CM

## 2017-02-17 DIAGNOSIS — K7031 Alcoholic cirrhosis of liver with ascites: Secondary | ICD-10-CM | POA: Diagnosis not present

## 2017-02-17 DIAGNOSIS — M2550 Pain in unspecified joint: Secondary | ICD-10-CM | POA: Diagnosis not present

## 2017-02-17 DIAGNOSIS — F410 Panic disorder [episodic paroxysmal anxiety] without agoraphobia: Secondary | ICD-10-CM | POA: Diagnosis not present

## 2017-02-17 DIAGNOSIS — K7460 Unspecified cirrhosis of liver: Secondary | ICD-10-CM | POA: Diagnosis not present

## 2017-02-17 DIAGNOSIS — F17200 Nicotine dependence, unspecified, uncomplicated: Secondary | ICD-10-CM | POA: Diagnosis not present

## 2017-02-17 DIAGNOSIS — Z79899 Other long term (current) drug therapy: Secondary | ICD-10-CM | POA: Insufficient documentation

## 2017-02-17 DIAGNOSIS — F331 Major depressive disorder, recurrent, moderate: Secondary | ICD-10-CM

## 2017-02-17 DIAGNOSIS — R45851 Suicidal ideations: Secondary | ICD-10-CM | POA: Insufficient documentation

## 2017-02-17 DIAGNOSIS — F32A Depression, unspecified: Secondary | ICD-10-CM

## 2017-02-17 DIAGNOSIS — F1094 Alcohol use, unspecified with alcohol-induced mood disorder: Secondary | ICD-10-CM | POA: Insufficient documentation

## 2017-02-17 DIAGNOSIS — F4323 Adjustment disorder with mixed anxiety and depressed mood: Secondary | ICD-10-CM

## 2017-02-17 DIAGNOSIS — Z789 Other specified health status: Secondary | ICD-10-CM

## 2017-02-17 LAB — CBC
HCT: 46.4 % (ref 40.0–52.0)
Hemoglobin: 16.3 g/dL (ref 13.0–18.0)
MCH: 31.6 pg (ref 26.0–34.0)
MCHC: 35.1 g/dL (ref 32.0–36.0)
MCV: 90 fL (ref 80.0–100.0)
PLATELETS: 180 10*3/uL (ref 150–440)
RBC: 5.16 MIL/uL (ref 4.40–5.90)
RDW: 14.2 % (ref 11.5–14.5)
WBC: 13.3 10*3/uL — AB (ref 3.8–10.6)

## 2017-02-17 LAB — SALICYLATE LEVEL

## 2017-02-17 LAB — COMPREHENSIVE METABOLIC PANEL
ALT: 25 U/L (ref 17–63)
AST: 23 U/L (ref 15–41)
Albumin: 4 g/dL (ref 3.5–5.0)
Alkaline Phosphatase: 92 U/L (ref 38–126)
Anion gap: 12 (ref 5–15)
BUN: 20 mg/dL (ref 6–20)
CHLORIDE: 97 mmol/L — AB (ref 101–111)
CO2: 26 mmol/L (ref 22–32)
Calcium: 9.2 mg/dL (ref 8.9–10.3)
Creatinine, Ser: 0.83 mg/dL (ref 0.61–1.24)
GFR calc Af Amer: >60 mL/min (ref >60)
Glucose, Bld: 110 mg/dL — ABNORMAL HIGH (ref 65–99)
POTASSIUM: 3.7 mmol/L (ref 3.5–5.1)
Sodium: 135 mmol/L (ref 135–145)
Total Bilirubin: 0.9 mg/dL (ref 0.3–1.2)
Total Protein: 7.6 g/dL (ref 6.5–8.1)

## 2017-02-17 LAB — URINE DRUG SCREEN, QUALITATIVE (ARMC ONLY)
AMPHETAMINES, UR SCREEN: NOT DETECTED
Barbiturates, Ur Screen: NOT DETECTED
Benzodiazepine, Ur Scrn: POSITIVE — AB
Cannabinoid 50 Ng, Ur ~~LOC~~: NOT DETECTED
Cocaine Metabolite,Ur ~~LOC~~: NOT DETECTED
MDMA (ECSTASY) UR SCREEN: NOT DETECTED
Methadone Scn, Ur: NOT DETECTED
Opiate, Ur Screen: NOT DETECTED
Phencyclidine (PCP) Ur S: NOT DETECTED
TRICYCLIC, UR SCREEN: NOT DETECTED

## 2017-02-17 LAB — ACETAMINOPHEN LEVEL: Acetaminophen (Tylenol), Serum: <10 ug/mL — ABNORMAL LOW (ref 10–30)

## 2017-02-17 LAB — ETHANOL: ALCOHOL ETHYL (B): 97 mg/dL — AB (ref <5)

## 2017-02-17 MED ORDER — MIRTAZAPINE 15 MG PO TABS: 15.0000 mg | ORAL_TABLET | Freq: Every day | ORAL | 1 refills | Status: DC

## 2017-02-17 NOTE — ED Provider Notes (Addendum)
[facility] Emergency Department Provider Note   ____________________________________________   I have reviewed the triage vital signs and the nursing notes.   HISTORY  Chief Complaint Mental Health Problem   History limited by: Not Limited   HPI Andrew Carey is a 66 y.o. male who presents to the emergency department today from doctor's office because of concerns for SI. The patient states that he was more frustrated earlier today. He does have a history of liver disease. He states this is a terminal illness. In addition he has started drinking alcohol again. He has had associated difficulty with sleeping. in terms of medical complaints she states that he has arthritis and this can be quite painful mornings. Does improve with movement. Located primarily in his limbs.    Past Medical History:  Diagnosis Date  . Allergy   . Arthritis   . Cirrhosis ([place])   . Colon polyps   . Colorectal cancer ([place])   . Depression   . Diverticulosis   . Liver lesion   . Stroke [facility])     Patient Active Problem List   Diagnosis Date Noted  . Moderate recurrent major depression ([place]) 02/17/2017  . Adjustment disorder with mixed anxiety and depressed mood 02/17/2017  . Alcohol abuse 02/17/2017  . Elevated coronary artery calcium score 12/22/2016  . GAD (generalized anxiety disorder) 06/14/2016  . Chronic joint pain 02/16/2016  . Depression 02/10/2016  . Pruritic condition 02/10/2016  . Panic disorder 02/10/2016  . Alcoholic cirrhosis of liver with ascites ([place]) 02/10/2016  . Hypotension due to drugs 01/19/2016    Past Surgical History:  Procedure Laterality Date  . COLON SURGERY    . HERNIA REPAIR      Prior to Admission medications   Medication Sig Start Date End Date Taking? Authorizing Provider  ALPRAZolam Duanne Moron) 1 MG tablet Take 1 tablet (1 mg total) by mouth 3 (three) times daily as needed for anxiety. 10/11/16   Birdie Sons, MD  furosemide (LASIX)  80 MG tablet take 1 tablet by mouth once daily Patient taking differently: take 0.5  tablet by mouth once daily 06/21/16   Birdie Sons, MD  lactulose (CHRONULAC) 10 GM/15ML solution TAKE 30 MILLILITERS BY MOUTH THREE TIMES A DAY IF NEEDED 01/31/17   Birdie Sons, MD  mirtazapine (REMERON) 15 MG tablet Take 1 tablet (15 mg total) by mouth at bedtime. 02/17/17   Clapacs, Madie Reno, MD  OxyCODONE HCl 7.5 MG TABA Take 7.5 mg by mouth 2 (two) times daily as needed. 01/20/17   Birdie Sons, MD  RA VITAMIN B-1 100 MG TABS take 1 tablet by mouth once daily 02/04/16   Birdie Sons, MD  spironolactone (ALDACTONE) 100 MG tablet take 2 tablets by mouth once daily 11/08/16   Birdie Sons, MD    Allergies Fluoxetine and Codeine  Family History  Problem Relation Age of Onset  . Heart failure Father   . Congestive Heart Failure Father   . Heart disease Father   . Stroke Father   . Cancer Mother   . Colon polyps Mother   . Colon polyps Other   . Depression Other   . Mental illness Other     Social History Social History  Substance Use Topics  . Smoking status: Current Every Day Smoker    Packs/day: 0.50  . Smokeless tobacco: Never Used  . Alcohol use Yes    Review of Systems Constitutional: No fever/chills Eyes: No visual changes.  ENT: No sore throat. Cardiovascular: Denies chest pain. Respiratory: Denies shortness of breath. Gastrointestinal: No abdominal pain.   Genitourinary: Negative for dysuria. Musculoskeletal: Positive for arthritis of the extremities.  Skin: Negative for rash. Neurological: Negative for headaches, focal weakness or numbness.  ____________________________________________   PHYSICAL EXAM:  VITAL SIGNS: ED Triage Vitals  Enc Vitals Group     BP 02/17/17 1242 127/85     Pulse Rate 02/17/17 1242 90     Resp 02/17/17 1242 13     Temp 02/17/17 1242 97.8 F (36.6 C)     Temp Source 02/17/17 1242 Oral     SpO2 02/17/17 1242 92 %     Weight  02/17/17 1243 200 lb (90.7 kg)     Height 02/17/17 1243 5\' 10"  (1.778 m)   Constitutional: Alert and oriented.  Eyes: Conjunctivae are normal.  ENT   Head: Normocephalic and atraumatic.   Nose: No congestion/rhinnorhea.   Mouth/Throat: Mucous membranes are moist.   Neck: No stridor. Hematological/Lymphatic/Immunilogical: No cervical lymphadenopathy. Cardiovascular: Normal rate, regular rhythm.  No murmurs, rubs, or gallops.  Respiratory: Normal respiratory effort without tachypnea nor retractions. Breath sounds are clear and equal bilaterally. No wheezes/rales/rhonchi. Gastrointestinal: Soft and non tender. No rebound. No guarding.  Genitourinary: Deferred Musculoskeletal: Normal range of motion in all extremities. No lower extremity edema. Neurologic:  Normal speech and language. No gross focal neurologic deficits are appreciated.  Skin:  Skin is warm, dry and intact. No rash noted. Psychiatric: Slightly depressed.   ____________________________________________    LABS (pertinent positives/negatives)  EtOH 97 CBC WBC 13.3 CMP wnl save for chlo 97, Glu 110  ____________________________________________   EKG  None  ____________________________________________    RADIOLOGY  None  ____________________________________________   PROCEDURES  Procedures  ____________________________________________   INITIAL IMPRESSION / ASSESSMENT AND PLAN / ED COURSE  Pertinent labs & imaging results that were available during my care of the patient were reviewed by me and considered in my medical decision making (see chart for details).  patient sent from the primary care doctor's office because of concerns for SI. Patient was evaluated by psychiatry here. No longer stating any desire for self-harm. Patient does state he has frustrations given the situation of his disease and family situations. Dr. Weber Cooks Will trial patient on  Remeron.  ____________________________________________   FINAL CLINICAL IMPRESSION(S) / ED DIAGNOSES  Final diagnoses:  Alcohol use  Depression, unspecified depression type  Hepatic cirrhosis, unspecified hepatic cirrhosis type, unspecified whether ascites present [facility])     Note: This dictation was prepared with Dragon dictation. Any transcriptional errors that result from this process are unintentional     Nance Pear, MD 02/17/17 [place]    Nance Pear, MD 02/17/17 [phone]

## 2017-02-17 NOTE — Progress Notes (Signed)
Patient: Andrew Carey Male    DOB: December 23, 1950   66 y.o.   MRN: [id number] Visit Date: 02/17/2017  Today's Provider: Lelon Huh, MD   Chief Complaint  Patient presents with  . Anxiety   Subjective:    HPI  Follow Up ER Visit  Patient is here for ER follow up.  He was recently seen at [facility] for alcohol abuse and suicidal ideation on 01/21/2017. He reports fair compliance with treatment. He reports this condition is Unchanged.   He reports having obsessive thoughts about dying from liver disease. Is very frustrated with liver clinic at [facility] and 'fired' the transplant team due to incometence. He states he feels extremely anxious all the time and that if he can't get stronger medications for his anxiety he will go out and score some heroin. Has started drinking alcohol again and states he will continue drink if he can't get stronger medications for anxiety. He admits that his last drink of alcohol was last night. He states he has thoughts of killing himself, but has not made specific plan, despite his threats to buy drugs off the street. He has taken SSRIs in the past, which he states caused terrible reactions including hallucinations. I prescribed Risperdal for him last year, but he never filled prescribed due to potential for side effects. He states he would much rather take LSD than antidepressants.   He is here with his sister today who is very concerned and reports he is drinking alcohol regularly. They have called hospice in and states that they been talking to one of their counselors. He is scheduled to see outpatient counselor in November, but is not followed by psychiatrist.        Allergies  Allergen Reactions  . Fluoxetine     Hallucinations  . Codeine Nausea And Vomiting and Nausea Only     Current Outpatient Prescriptions:  .  ALPRAZolam (XANAX) 1 MG tablet, Take 1 tablet (1 mg total) by mouth 3 (three) times daily as needed for anxiety., Disp: 90 tablet, Rfl:  5 .  furosemide (LASIX) 80 MG tablet, take 1 tablet by mouth once daily (Patient taking differently: take 0.5  tablet by mouth once daily), Disp: 30 tablet, Rfl: 5 .  lactulose (CHRONULAC) 10 GM/15ML solution, TAKE 30 MILLILITERS BY MOUTH THREE TIMES A DAY IF NEEDED, Disp: 1800 mL, Rfl: 12 .  OxyCODONE HCl 7.5 MG TABA, Take 7.5 mg by mouth 2 (two) times daily as needed., Disp: 60 tablet, Rfl: 0 .  RA VITAMIN B-1 100 MG TABS, take 1 tablet by mouth once daily, Disp: 100 tablet, Rfl: 4 .  spironolactone (ALDACTONE) 100 MG tablet, take 2 tablets by mouth once daily, Disp: 60 tablet, Rfl: 12  Review of Systems  Constitutional: Positive for activity change, appetite change and fatigue.  Musculoskeletal: Positive for arthralgias.  Neurological: Positive for dizziness, weakness and headaches.  Psychiatric/Behavioral: Positive for agitation, decreased concentration and sleep disturbance. Negative for self-injury and suicidal ideas. The patient is nervous/anxious.     Social History  Substance Use Topics  . Smoking status: Current Every Day Smoker    Packs/day: 0.50  . Smokeless tobacco: Never Used  . Alcohol use Yes   Objective:   BP 120/68 (BP Location: Left Arm, Patient Position: Sitting, Cuff Size: Normal)   Pulse 72   Temp 98.4 F (36.9 C)   Resp 16   Ht 6' (1.829 m)   Wt 204 lb (92.5 kg)  SpO2 98%   BMI 27.67 kg/m  Vitals:   02/17/17 1130  BP: 120/68  Pulse: 72  Resp: 16  Temp: 98.4 F (36.9 C)  SpO2: 98%  Weight: 204 lb (92.5 kg)  Height: 6' (1.829 m)     Physical Exam  General appearance: alert, well developed, well nourished, Head: Normocephalic, without obvious abnormality, atraumatic Respiratory: Respirations even and unlabored, normal respiratory rate Psych: Alert and oriented x 3. Agitated with meandering thought process. Focusing on what he feels is impending death.      Assessment & Plan:     1. Depression, unspecified depression type Acutely  decompensated, abusing alcohol and threatening use of illicit drugs. Has not been compliant with prior prescription for antidepressants. He is unlikely to comply with outpatient treatment. He did agree to evaluation of behavioral health evaluation for possible inpatient treatment. He agreed to allow his sister to drive him to ER. Advised ER triage that patient is on his way.   2. Alcoholic cirrhosis of liver with ascites (HCC) Stable, strongly encouraged to follow up with another liver clinic if he does not return to [facility]  3. Alcohol abuse Recently relapsed  4. Panic disorder Refuses SSRI due to prior adverse event. Advised that considering his underlying liver disease, his relapse into alcoholism and reticence for trying new medication he should consider inpatient treatment. He agreed to allow his sister to take him to the emergency room for psychiatric evaluation.        Lelon Huh, MD  [facility]

## 2017-02-17 NOTE — Telephone Encounter (Signed)
Dr. [name], is it ok to send last office notes and Demographics? Patient has an appointment scheduled today for ER follow up of Anxiety and suicidal ideation.

## 2017-02-17 NOTE — Telephone Encounter (Signed)
Done-Anastasiya V Hopkins, RMA  

## 2017-02-17 NOTE — ED Triage Notes (Addendum)
Patient presents to ED from pcp office. Patient told his doctor "if you dont get me relief I am going to overdose on heroine". Patients sister states patient took 71 xanax today. Patient states he only took 3. Patient has end stage liver failure due to drinking. Patient states he began drinking again, last drink was last night. Patient reports SI.

## 2017-02-17 NOTE — ED Notes (Signed)
AAOx3.  Skin warm and dry. NAD.  Ambulates with easy and steady gait.   
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
## 2017-02-17 NOTE — Progress Notes (Addendum)
Palliative:  I was paged by Dr. Weber Cooks regarding palliative consult. Unfortunately I will not be able to visit with Andrew Carey today. I recommend referral for outpatient palliative care follow up to further discuss and follow at his home and I have made referral to [name] for outpatient palliative follow up via [facility] palliative program. Thank you for this consult.   No charge  Vinie Sill, NP Palliative Medicine Team Pager # [phone] (M-F 8a-5p) Team Phone # [phone] (Nights/Weekends)

## 2017-02-17 NOTE — Telephone Encounter (Signed)
That's fine

## 2017-02-17 NOTE — ED Notes (Signed)
Pt laying in bed resting.  Pt was iven a urine cup and reminded him that we need a sample.  Pt agreed.  Cup left in room.

## 2017-02-17 NOTE — Care Management (Signed)
This CM was asked by palliative care for social security number for a referral on this patient.  Provided the information that is necessary to complete referral process

## 2017-02-17 NOTE — ED Notes (Signed)
Patient states he does not want anyone to have the pass code and he is not want any visitors.

## 2017-02-17 NOTE — Discharge Instructions (Signed)
Please seek medical attention for any high fevers, chest pain, shortness of breath, change in behavior, persistent vomiting, bloody stool or any other new or concerning symptoms.  

## 2017-02-21 ENCOUNTER — Other Ambulatory Visit: Payer: Self-pay | Admitting: Family Medicine

## 2017-02-21 DIAGNOSIS — G8929 Other chronic pain: Secondary | ICD-10-CM

## 2017-02-21 DIAGNOSIS — M2550 Pain in unspecified joint: Principal | ICD-10-CM

## 2017-02-21 MED ORDER — OXYCODONE HCL 7.5 MG PO TABS: 7.5000 mg | ORAL_TABLET | Freq: Two times a day (BID) | ORAL | 0 refills | Status: DC | PRN

## 2017-02-21 NOTE — Telephone Encounter (Signed)
Please review. Thanks!  

## 2017-02-21 NOTE — Telephone Encounter (Signed)
Pt needs refill on his Oxycodone   He is out.  He would like to pick it up tomorrow  Thanks teri

## 2017-02-27 ENCOUNTER — Ambulatory Visit: Payer: Medicare Other | Admitting: Cardiovascular Disease

## 2017-03-04 ENCOUNTER — Other Ambulatory Visit: Payer: Self-pay | Admitting: Family Medicine

## 2017-03-21 ENCOUNTER — Other Ambulatory Visit: Payer: Self-pay | Admitting: Family Medicine

## 2017-03-21 DIAGNOSIS — M2550 Pain in unspecified joint: Principal | ICD-10-CM

## 2017-03-21 DIAGNOSIS — G8929 Other chronic pain: Secondary | ICD-10-CM

## 2017-03-21 MED ORDER — OXYCODONE HCL 7.5 MG PO TABS: 7.5000 mg | ORAL_TABLET | Freq: Two times a day (BID) | ORAL | 0 refills | Status: DC | PRN

## 2017-03-21 NOTE — Telephone Encounter (Signed)
Pt contacted office for refill request on the following medications:  OxyCODONE HCl 7.5 MG TABA   CB#[phone] or [phone]/[place]

## 2017-03-22 ENCOUNTER — Ambulatory Visit (INDEPENDENT_AMBULATORY_CARE_PROVIDER_SITE_OTHER): Payer: Medicare Other | Admitting: Psychology

## 2017-03-22 DIAGNOSIS — F411 Generalized anxiety disorder: Secondary | ICD-10-CM

## 2017-03-22 DIAGNOSIS — F4323 Adjustment disorder with mixed anxiety and depressed mood: Secondary | ICD-10-CM | POA: Diagnosis not present

## 2017-04-03 ENCOUNTER — Ambulatory Visit: Payer: Medicare Other | Admitting: Psychology

## 2017-04-03 ENCOUNTER — Ambulatory Visit: Payer: Medicare (Managed Care) | Admitting: Psychology

## 2017-04-03 DIAGNOSIS — F411 Generalized anxiety disorder: Secondary | ICD-10-CM

## 2017-04-03 DIAGNOSIS — F4323 Adjustment disorder with mixed anxiety and depressed mood: Secondary | ICD-10-CM | POA: Diagnosis not present

## 2017-04-12 ENCOUNTER — Ambulatory Visit (INDEPENDENT_AMBULATORY_CARE_PROVIDER_SITE_OTHER): Payer: Medicare Other | Admitting: Family Medicine

## 2017-04-12 ENCOUNTER — Encounter: Payer: Self-pay | Admitting: Family Medicine

## 2017-04-12 VITALS — BP 110/70 | HR 88 | Temp 97.9°F | Resp 16 | Ht 72.0 in | Wt 220.0 lb

## 2017-04-12 DIAGNOSIS — M2550 Pain in unspecified joint: Secondary | ICD-10-CM

## 2017-04-12 DIAGNOSIS — K7031 Alcoholic cirrhosis of liver with ascites: Secondary | ICD-10-CM | POA: Diagnosis not present

## 2017-04-12 DIAGNOSIS — F331 Major depressive disorder, recurrent, moderate: Secondary | ICD-10-CM | POA: Diagnosis not present

## 2017-04-12 DIAGNOSIS — Z23 Encounter for immunization: Secondary | ICD-10-CM | POA: Diagnosis not present

## 2017-04-12 DIAGNOSIS — F4323 Adjustment disorder with mixed anxiety and depressed mood: Secondary | ICD-10-CM

## 2017-04-12 DIAGNOSIS — F410 Panic disorder [episodic paroxysmal anxiety] without agoraphobia: Secondary | ICD-10-CM | POA: Diagnosis not present

## 2017-04-12 DIAGNOSIS — G8929 Other chronic pain: Secondary | ICD-10-CM

## 2017-04-12 NOTE — Progress Notes (Signed)
Patient: Andrew Carey Male    DOB: Apr 21, 1951   66 y.o.   MRN: [id number] Visit Date: 04/12/2017  Today's Provider: Lelon Huh, MD   Chief Complaint  Patient presents with  . Follow-up  . Depression   Subjective:    HPI  Chronic joint pain From 12/22/2016-increased oxycodone 5 mg to 7.5 which he is taking twice a day most days which is working well.   Depression, unspecified depression type From 02/17/2017-patient referred to ER for evaluation of behavioral health and psychiatric evaluation. He was discharged from ER after determining he was not a threat to himself or others. Today he reports he has not had any alcohol since then is doing much better. He has started taking the mirtazapine which he is tolerating. Only complaint is that he is always hungry and is gaining weight. He is sleeping better.    Alcoholic cirrhosis of liver with ascites [facility]) He is scheduled to follow up at [facility] in December.   Panic disorder He continues on daily alprazolam which he feels is helping quite a bit. Is having no adverse effects.    Allergies  Allergen Reactions  . Fluoxetine     Hallucinations  . Codeine Nausea And Vomiting and Nausea Only     Current Outpatient Medications:  .  ALPRAZolam (XANAX) 1 MG tablet, Take 1 tablet (1 mg total) by mouth 3 (three) times daily as needed for anxiety., Disp: 90 tablet, Rfl: 5 .  furosemide (LASIX) 80 MG tablet, take 1 tablet by mouth once daily (Patient taking differently: take 0.5  tablet by mouth once daily), Disp: 30 tablet, Rfl: 5 .  lactulose (CHRONULAC) 10 GM/15ML solution, TAKE 30 MILLILITERS BY MOUTH THREE TIMES A DAY IF NEEDED, Disp: 1800 mL, Rfl: 12 .  mirtazapine (REMERON) 15 MG tablet, Take 1 tablet (15 mg total) by mouth at bedtime., Disp: 30 tablet, Rfl: 1 .  OxyCODONE HCl 7.5 MG TABA, Take 7.5 mg by mouth 2 (two) times daily as needed., Disp: 60 tablet, Rfl: 0 .  RA VITAMIN B-1 100 MG TABS, take 1 tablet by mouth  once daily, Disp: 100 tablet, Rfl: 4 .  spironolactone (ALDACTONE) 100 MG tablet, take 2 tablets by mouth once daily, Disp: 60 tablet, Rfl: 12  Review of Systems  Constitutional: Negative for appetite change, chills and fever.  Respiratory: Negative for chest tightness, shortness of breath and wheezing.   Cardiovascular: Negative for chest pain and palpitations.  Gastrointestinal: Negative for abdominal pain, nausea and vomiting.    Social History   Tobacco Use  . Smoking status: Current Every Day Smoker    Packs/day: 0.50  . Smokeless tobacco: Never Used  Substance Use Topics  . Alcohol use: Yes   Objective:   BP 110/70 (BP Location: Right Arm, Patient Position: Sitting, Cuff Size: Large)   Pulse 88   Temp 97.9 F (36.6 C) (Oral)   Resp 16   Ht 6' (1.829 m)   Wt 220 lb (99.8 kg)   SpO2 99%   BMI 29.84 kg/m  Vitals:   04/12/17 0941  BP: 110/70  Pulse: 88  Resp: 16  Temp: 97.9 F (36.6 C)  TempSrc: Oral  SpO2: 99%  Weight: 220 lb (99.8 kg)  Height: 6' (1.829 m)     Physical Exam  General appearance: alert, well developed, well nourished, cooperative and in no distress Head: Normocephalic, without obvious abnormality, atraumatic Respiratory: Respirations even and unlabored, normal respiratory rate Extremities: No gross  deformities Skin: Skin color, texture, turgor normal. No rashes seen  Psych: Appropriate mood and affect. Neurologic: Mental status: Alert, oriented to person, place, and time, thought content appropriate.     Assessment & Plan:     1. Moderate recurrent major depression ([place]) Much better since cessation of alcohol consumption. He agrees that he should never drink any alcohol again. Continue mirtazapine.   2. Alcoholic cirrhosis of liver with ascites ([place]) Doing relatively well since cessation of alcohol. No sign of ascites on current diuretics.   3. Panic disorder Doing well on schedule alprazolam.   4. Chronic joint pain Doing better  with increase oxycodone to 7.5mg  BID.   5. Adjustment disorder with mixed anxiety and depressed mood Improved.   6. Need for influenza vaccination  - Flu Vaccine QUAD 36+ mos IM  Return in about 6 months (around 10/10/2017).       Lelon Huh, MD  [facility]

## 2017-04-17 ENCOUNTER — Ambulatory Visit: Payer: Medicare Other | Admitting: Psychology

## 2017-04-18 ENCOUNTER — Other Ambulatory Visit: Payer: Self-pay | Admitting: Family Medicine

## 2017-04-18 DIAGNOSIS — F419 Anxiety disorder, unspecified: Secondary | ICD-10-CM

## 2017-04-18 DIAGNOSIS — G8929 Other chronic pain: Secondary | ICD-10-CM

## 2017-04-18 DIAGNOSIS — F410 Panic disorder [episodic paroxysmal anxiety] without agoraphobia: Secondary | ICD-10-CM

## 2017-04-18 DIAGNOSIS — M2550 Pain in unspecified joint: Principal | ICD-10-CM

## 2017-04-18 MED ORDER — OXYCODONE HCL 7.5 MG PO TABS: 7.5000 mg | ORAL_TABLET | Freq: Two times a day (BID) | ORAL | 0 refills | Status: DC | PRN

## 2017-04-18 NOTE — Telephone Encounter (Signed)
Please advise patient that oxycodone prescription has been sent electronically to   [place], [place] [address] Phone: [phone] Fax: [phone]

## 2017-04-18 NOTE — Telephone Encounter (Signed)
Please review. Thanks!  

## 2017-04-18 NOTE — Telephone Encounter (Signed)
Patient advised.

## 2017-04-18 NOTE — Telephone Encounter (Signed)
Pt contacted office for refill request on the following medications:  OxyCODONE HCl 7.5 MG TABA   CB#[phone] or [phone]/[place]

## 2017-04-19 ENCOUNTER — Ambulatory Visit: Payer: Medicare Other | Admitting: Psychology

## 2017-04-19 ENCOUNTER — Telehealth: Payer: Self-pay | Admitting: Family Medicine

## 2017-04-19 DIAGNOSIS — F410 Panic disorder [episodic paroxysmal anxiety] without agoraphobia: Secondary | ICD-10-CM

## 2017-04-19 DIAGNOSIS — M2550 Pain in unspecified joint: Secondary | ICD-10-CM

## 2017-04-19 DIAGNOSIS — F419 Anxiety disorder, unspecified: Secondary | ICD-10-CM

## 2017-04-19 DIAGNOSIS — G8929 Other chronic pain: Secondary | ICD-10-CM

## 2017-04-19 MED ORDER — OXYCODONE HCL 15 MG PO TABS: 7.5000 mg | ORAL_TABLET | Freq: Two times a day (BID) | ORAL | 0 refills | Status: DC

## 2017-04-19 MED ORDER — ALPRAZOLAM 1 MG PO TABS: ORAL_TABLET | ORAL | 5 refills | Status: DC

## 2017-04-19 MED ORDER — OXYCODONE HCL 7.5 MG PO TABS: 7.5000 mg | ORAL_TABLET | Freq: Two times a day (BID) | ORAL | 0 refills | Status: DC | PRN

## 2017-04-19 NOTE — Telephone Encounter (Signed)
Have sent alprazolam prescription.  Please contact pharmacy to see if prescription sent electronically was any different that what they were filling when prescriptions were printed.

## 2017-04-19 NOTE — Telephone Encounter (Signed)
Per pharmacy, when oxycodone 7.5 was received electronically, Oxaydo will automatically be filled, which is more expensive. [facility] does not carry oxycodone 7.5 mg. Pharmacist said we can either have pt pick-up hard copy or send in oxycodone 15 mg which they can half?

## 2017-04-19 NOTE — Telephone Encounter (Addendum)
Please advise? Patient also need a refill for Xanax sent to [facility].

## 2017-04-19 NOTE — Telephone Encounter (Signed)
Have resent prescription for 15mg  to take 1/2 twice daily.

## 2017-04-19 NOTE — Telephone Encounter (Signed)
Pt states he got a call from [facility] stating the Rx OxyCODONE HCl 7.5 MG TABA is going to change from $9.00 to $90.00.  Pt is asking why this has changed.  The only difference is we sent the Rx and he did not pick up the Rx.  Please advise today if possible.  BJ#[phone]/YQ

## 2017-05-15 ENCOUNTER — Other Ambulatory Visit: Payer: Self-pay | Admitting: Family Medicine

## 2017-05-15 MED ORDER — OXYCODONE HCL 15 MG PO TABS: 7.5000 mg | ORAL_TABLET | Freq: Two times a day (BID) | ORAL | 0 refills | Status: DC

## 2017-05-15 NOTE — Telephone Encounter (Signed)
Pt contacted office for refill request on the following medications:  oxyCODONE (ROXICODONE) 15 MG immediate release tablet  [facility].  [phone]

## 2017-06-02 ENCOUNTER — Emergency Department: Payer: Medicare Other

## 2017-06-02 ENCOUNTER — Emergency Department
Admission: EM | Admit: 2017-06-02 | Discharge: 2017-06-03 | Disposition: A | Discharge status: Other Acute Inpatient Hospital | Payer: Medicare Other | Attending: Emergency Medicine | Admitting: Emergency Medicine

## 2017-06-02 ENCOUNTER — Other Ambulatory Visit: Payer: Self-pay

## 2017-06-02 DIAGNOSIS — T50901A Poisoning by unspecified drugs, medicaments and biological substances, accidental (unintentional), initial encounter: Secondary | ICD-10-CM | POA: Diagnosis not present

## 2017-06-02 DIAGNOSIS — J9621 Acute and chronic respiratory failure with hypoxia: Secondary | ICD-10-CM | POA: Diagnosis not present

## 2017-06-02 DIAGNOSIS — F410 Panic disorder [episodic paroxysmal anxiety] without agoraphobia: Secondary | ICD-10-CM | POA: Diagnosis present

## 2017-06-02 DIAGNOSIS — Z85038 Personal history of other malignant neoplasm of large intestine: Secondary | ICD-10-CM | POA: Diagnosis not present

## 2017-06-02 DIAGNOSIS — F411 Generalized anxiety disorder: Secondary | ICD-10-CM | POA: Diagnosis present

## 2017-06-02 DIAGNOSIS — F1010 Alcohol abuse, uncomplicated: Secondary | ICD-10-CM | POA: Diagnosis present

## 2017-06-02 DIAGNOSIS — Y92009 Unspecified place in unspecified non-institutional (private) residence as the place of occurrence of the external cause: Secondary | ICD-10-CM | POA: Insufficient documentation

## 2017-06-02 DIAGNOSIS — J189 Pneumonia, unspecified organism: Secondary | ICD-10-CM | POA: Diagnosis not present

## 2017-06-02 DIAGNOSIS — T1491XA Suicide attempt, initial encounter: Secondary | ICD-10-CM | POA: Insufficient documentation

## 2017-06-02 DIAGNOSIS — F329 Major depressive disorder, single episode, unspecified: Secondary | ICD-10-CM | POA: Insufficient documentation

## 2017-06-02 DIAGNOSIS — F17210 Nicotine dependence, cigarettes, uncomplicated: Secondary | ICD-10-CM | POA: Insufficient documentation

## 2017-06-02 DIAGNOSIS — Z79899 Other long term (current) drug therapy: Secondary | ICD-10-CM | POA: Diagnosis not present

## 2017-06-02 DIAGNOSIS — Y33XXXA Other specified events, undetermined intent, initial encounter: Secondary | ICD-10-CM | POA: Insufficient documentation

## 2017-06-02 DIAGNOSIS — F332 Major depressive disorder, recurrent severe without psychotic features: Secondary | ICD-10-CM | POA: Diagnosis not present

## 2017-06-02 DIAGNOSIS — K7031 Alcoholic cirrhosis of liver with ascites: Secondary | ICD-10-CM | POA: Diagnosis present

## 2017-06-02 DIAGNOSIS — J9811 Atelectasis: Secondary | ICD-10-CM | POA: Diagnosis not present

## 2017-06-02 DIAGNOSIS — Y999 Unspecified external cause status: Secondary | ICD-10-CM | POA: Insufficient documentation

## 2017-06-02 DIAGNOSIS — T424X1A Poisoning by benzodiazepines, accidental (unintentional), initial encounter: Secondary | ICD-10-CM

## 2017-06-02 DIAGNOSIS — Y9389 Activity, other specified: Secondary | ICD-10-CM | POA: Insufficient documentation

## 2017-06-02 DIAGNOSIS — G8929 Other chronic pain: Secondary | ICD-10-CM | POA: Diagnosis present

## 2017-06-02 DIAGNOSIS — J449 Chronic obstructive pulmonary disease, unspecified: Secondary | ICD-10-CM | POA: Diagnosis not present

## 2017-06-02 DIAGNOSIS — R0902 Hypoxemia: Secondary | ICD-10-CM

## 2017-06-02 DIAGNOSIS — J90 Pleural effusion, not elsewhere classified: Secondary | ICD-10-CM | POA: Diagnosis not present

## 2017-06-02 DIAGNOSIS — M2550 Pain in unspecified joint: Secondary | ICD-10-CM

## 2017-06-02 LAB — CBC
HEMATOCRIT: 43 % (ref 40.0–52.0)
Hemoglobin: 15.1 g/dL (ref 13.0–18.0)
MCH: 32.1 pg (ref 26.0–34.0)
MCHC: 35 g/dL (ref 32.0–36.0)
MCV: 91.7 fL (ref 80.0–100.0)
Platelets: 170 10*3/uL (ref 150–440)
RBC: 4.69 MIL/uL (ref 4.40–5.90)
RDW: 13.7 % (ref 11.5–14.5)
WBC: 10.9 10*3/uL — AB (ref 3.8–10.6)

## 2017-06-02 LAB — COMPREHENSIVE METABOLIC PANEL
ALBUMIN: 3.7 g/dL (ref 3.5–5.0)
ALK PHOS: 89 U/L (ref 38–126)
ALT: 19 U/L (ref 17–63)
AST: 21 U/L (ref 15–41)
Anion gap: 8 (ref 5–15)
BUN: 21 mg/dL — ABNORMAL HIGH (ref 6–20)
CHLORIDE: 104 mmol/L (ref 101–111)
CO2: 26 mmol/L (ref 22–32)
Calcium: 8.7 mg/dL — ABNORMAL LOW (ref 8.9–10.3)
Creatinine, Ser: 0.83 mg/dL (ref 0.61–1.24)
GFR calc non Af Amer: >60 mL/min (ref >60)
GLUCOSE: 90 mg/dL (ref 65–99)
POTASSIUM: 3.8 mmol/L (ref 3.5–5.1)
Sodium: 138 mmol/L (ref 135–145)
Total Bilirubin: 1 mg/dL (ref 0.3–1.2)
Total Protein: 7.2 g/dL (ref 6.5–8.1)

## 2017-06-02 LAB — URINE DRUG SCREEN, QUALITATIVE (ARMC ONLY)
AMPHETAMINES, UR SCREEN: NOT DETECTED
BARBITURATES, UR SCREEN: NOT DETECTED
Benzodiazepine, Ur Scrn: POSITIVE — AB
Cannabinoid 50 Ng, Ur ~~LOC~~: NOT DETECTED
Cocaine Metabolite,Ur ~~LOC~~: NOT DETECTED
MDMA (Ecstasy)Ur Screen: NOT DETECTED
METHADONE SCREEN, URINE: NOT DETECTED
Opiate, Ur Screen: POSITIVE — AB
Phencyclidine (PCP) Ur S: NOT DETECTED
TRICYCLIC, UR SCREEN: NOT DETECTED

## 2017-06-02 LAB — ACETAMINOPHEN LEVEL

## 2017-06-02 LAB — ETHANOL: Alcohol, Ethyl (B): 74 mg/dL — ABNORMAL HIGH (ref <10)

## 2017-06-02 LAB — SALICYLATE LEVEL

## 2017-06-02 MED ORDER — NICOTINE 21 MG/24HR TD PT24
21.0000 mg | MEDICATED_PATCH | Freq: Every day | TRANSDERMAL | Status: DC
  Administered 2017-06-02: 21 mg via TRANSDERMAL
  Filled 2017-06-02: qty 1

## 2017-06-02 MED ORDER — LORAZEPAM 2 MG PO TABS: 0.0000 mg | ORAL_TABLET | Freq: Four times a day (QID) | ORAL | Status: DC

## 2017-06-02 MED ORDER — CEFTRIAXONE SODIUM IN DEXTROSE 20 MG/ML IV SOLN: 1.0000 g | INTRAVENOUS | Status: DC

## 2017-06-02 MED ORDER — LACTULOSE 10 GM/15ML PO SOLN
20.0000 g | Freq: Two times a day (BID) | ORAL | Status: DC
  Filled 2017-06-02: qty 30

## 2017-06-02 MED ORDER — SPIRONOLACTONE 100 MG PO TABS
100.0000 mg | ORAL_TABLET | Freq: Every day | ORAL | Status: DC
  Filled 2017-06-02: qty 1

## 2017-06-02 MED ORDER — ALBUTEROL SULFATE HFA 108 (90 BASE) MCG/ACT IN AERS
2.0000 | INHALATION_SPRAY | RESPIRATORY_TRACT | Status: DC | PRN
  Filled 2017-06-02: qty 6.7

## 2017-06-02 MED ORDER — CEFTRIAXONE SODIUM IN DEXTROSE 20 MG/ML IV SOLN
1.0000 g | Freq: Once | INTRAVENOUS | Status: AC
  Administered 2017-06-02: 1 g via INTRAVENOUS
  Filled 2017-06-02: qty 50

## 2017-06-02 MED ORDER — DEXTROSE 5 % IV SOLN: 500.0000 mg | Freq: Once | INTRAVENOUS | Status: DC

## 2017-06-02 MED ORDER — OXYCODONE HCL 5 MG PO TABS
7.5000 mg | ORAL_TABLET | Freq: Two times a day (BID) | ORAL | Status: DC
  Administered 2017-06-02: 7.5 mg via ORAL
  Filled 2017-06-02: qty 2

## 2017-06-02 MED ORDER — VITAMIN B-1 100 MG PO TABS
100.0000 mg | ORAL_TABLET | Freq: Every day | ORAL | Status: DC
  Administered 2017-06-02: 100 mg via ORAL
  Filled 2017-06-02: qty 1

## 2017-06-02 MED ORDER — LORAZEPAM 2 MG PO TABS: 0.0000 mg | ORAL_TABLET | Freq: Two times a day (BID) | ORAL | Status: DC

## 2017-06-02 MED ORDER — FUROSEMIDE 40 MG PO TABS
40.0000 mg | ORAL_TABLET | Freq: Every day | ORAL | Status: DC
  Filled 2017-06-02: qty 1

## 2017-06-02 MED ORDER — THIAMINE HCL 100 MG/ML IJ SOLN: 100.0000 mg | Freq: Every day | INTRAMUSCULAR | Status: DC

## 2017-06-02 MED ORDER — LEVOFLOXACIN 750 MG PO TABS
750.0000 mg | ORAL_TABLET | Freq: Once | ORAL | Status: AC
  Administered 2017-06-02: 750 mg via ORAL
  Filled 2017-06-02: qty 1

## 2017-06-02 MED ORDER — ALPRAZOLAM 0.5 MG PO TABS: 1.0000 mg | ORAL_TABLET | Freq: Three times a day (TID) | ORAL | Status: DC

## 2017-06-02 MED ORDER — VITAMIN B-1 100 MG PO TABS
100.0000 mg | ORAL_TABLET | Freq: Every day | ORAL | Status: DC
  Administered 2017-06-02: 100 mg via ORAL
  Filled 2017-06-02 (×2): qty 1

## 2017-06-02 MED ORDER — LORAZEPAM 2 MG/ML IJ SOLN: 0.0000 mg | Freq: Four times a day (QID) | INTRAMUSCULAR | Status: DC

## 2017-06-02 MED ORDER — DEXTROSE 5 % IV SOLN: 500.0000 mg | INTRAVENOUS | Status: DC

## 2017-06-02 MED ORDER — LORAZEPAM 2 MG/ML IJ SOLN: 0.0000 mg | Freq: Two times a day (BID) | INTRAMUSCULAR | Status: DC

## 2017-06-02 MED ORDER — ALBUTEROL SULFATE (2.5 MG/3ML) 0.083% IN NEBU
2.5000 mg | INHALATION_SOLUTION | Freq: Once | RESPIRATORY_TRACT | Status: AC
  Administered 2017-06-02: 2.5 mg via RESPIRATORY_TRACT
  Filled 2017-06-02: qty 3

## 2017-06-02 MED ORDER — LEVOFLOXACIN 750 MG PO TABS
750.0000 mg | ORAL_TABLET | Freq: Every day | ORAL | Status: DC
  Filled 2017-06-02: qty 1

## 2017-06-02 NOTE — ED Notes (Signed)
Pt reports taking spiranalactone 100mg , furosimide 40mg  QD, oxycodone 7.5mg  BID, xanax 1mg  TID. Is asking this RN whether he will get any of his home medications. Will convey message to Dr. Quentin Cornwall.

## 2017-06-02 NOTE — ED Triage Notes (Signed)
Sister is Toribio Harbour, [place] (papers scanned in to chart) [phone]

## 2017-06-02 NOTE — ED Notes (Signed)
Per Dr. Clearnce Hasten, 1:1 sitter can be DC'd, pt denies any SI.

## 2017-06-02 NOTE — ED Provider Notes (Addendum)
[facility] Emergency Department Provider Note  ____________________________________________   First MD Initiated Contact with Patient 06/02/17 0845     (approximate)  I have reviewed the triage vital signs and the nursing notes.   HISTORY  Chief Complaint Ingestion   HPI Andrew Carey is a 67 y.o. male with a history of liver cirrhosis who is presenting to the emergency department today with a suicide attempt.  He says that he has recently found out about extensive lies that he says were told to his children by his ex-wife.  He says that he has become very depressed recently and last night at about 730 or 8 PM took about 71 mg Xanax and then 4-5 1 mg Klonopin.  He said when he woke up this morning he felt panicked and then "had several drinks."  He says that he was staying at his sister's overnight and that she called the police this morning because of concern for the patient's well-being.  Involuntary commitment was filled out prior to arrival.  Patient says that he took these medications because he was either "going to die or relax."   Past Medical History:  Diagnosis Date  . Allergy   . Arthritis   . Cirrhosis ([place])   . Colon polyps   . Colorectal cancer ([place])   . Depression   . Diverticulosis   . Liver lesion   . Stroke [facility])     Patient Active Problem List   Diagnosis Date Noted  . Moderate recurrent major depression ([place]) 02/17/2017  . Adjustment disorder with mixed anxiety and depressed mood 02/17/2017  . Alcohol abuse 02/17/2017  . Elevated coronary artery calcium score 12/22/2016  . GAD (generalized anxiety disorder) 06/14/2016  . Chronic joint pain 02/16/2016  . Pruritic condition 02/10/2016  . Panic disorder 02/10/2016  . Alcoholic cirrhosis of liver with ascites ([place]) 02/10/2016  . Hypotension due to drugs 01/19/2016    Past Surgical History:  Procedure Laterality Date  . COLON SURGERY    . HERNIA REPAIR      Prior to  Admission medications   Medication Sig Start Date End Date Taking? Authorizing Provider  ALPRAZolam Duanne Moron) 1 MG tablet take 1 tablet by mouth three times a day if needed for anxiety 04/19/17   Birdie Sons, MD  furosemide (LASIX) 80 MG tablet take 1 tablet by mouth once daily Patient taking differently: take 0.5  tablet by mouth once daily 06/21/16   Birdie Sons, MD  lactulose (CHRONULAC) 10 GM/15ML solution TAKE 30 MILLILITERS BY MOUTH THREE TIMES A DAY IF NEEDED 01/31/17   Birdie Sons, MD  mirtazapine (REMERON) 15 MG tablet Take 1 tablet (15 mg total) by mouth at bedtime. 02/17/17   Clapacs, Madie Reno, MD  oxyCODONE (ROXICODONE) 15 MG immediate release tablet Take 0.5 tablets (7.5 mg total) by mouth 2 (two) times daily. 05/15/17   Birdie Sons, MD  RA VITAMIN B-1 100 MG TABS take 1 tablet by mouth once daily 03/05/17   Birdie Sons, MD  spironolactone (ALDACTONE) 100 MG tablet take 2 tablets by mouth once daily 11/08/16   Birdie Sons, MD    Allergies Fluoxetine and Codeine  Family History  Problem Relation Age of Onset  . Heart failure Father   . Congestive Heart Failure Father   . Heart disease Father   . Stroke Father   . Cancer Mother   . Colon polyps Mother   . Colon polyps Other   .  Depression Other   . Mental illness Other     Social History Social History   Tobacco Use  . Smoking status: Current Every Day Smoker    Packs/day: 0.50  . Smokeless tobacco: Never Used  Substance Use Topics  . Alcohol use: Yes  . Drug use: No    Review of Systems  Constitutional: No fever/chills Eyes: No visual changes. ENT: No sore throat. Cardiovascular: Denies chest pain. Respiratory: Denies shortness of breath. Gastrointestinal: No abdominal pain.  No nausea, no vomiting.  No diarrhea.  No constipation. Genitourinary: Negative for dysuria. Musculoskeletal: Negative for back pain. Skin: Negative for rash. Neurological: Negative for headaches, focal weakness  or numbness.   ____________________________________________   PHYSICAL EXAM:  VITAL SIGNS: ED Triage Vitals  Enc Vitals Group     BP 06/02/17 0853 (!) 80/57     Pulse Rate 06/02/17 0853 74     Resp 06/02/17 0853 16     Temp 06/02/17 0853 98.5 F (36.9 C)     Temp Source 06/02/17 0853 Oral     SpO2 06/02/17 0853 91 %     Weight 06/02/17 0841 220 lb (99.8 kg)     Height 06/02/17 0841 6' (1.829 m)     Head Circumference --      Peak Flow --      Pain Score 06/02/17 0840 8     Pain Loc --      Pain Edu? --      Excl. in [place]? --     Constitutional: Alert and oriented. Well appearing and in no acute distress. Eyes: Conjunctivae are normal.  Head: Atraumatic. Nose: No congestion/rhinnorhea. Mouth/Throat: Mucous membranes are moist.  Neck: No stridor.   Cardiovascular: Normal rate, regular rhythm. Grossly normal heart sounds.   Respiratory: Normal respiratory effort.  No retractions. Lungs CTAB. Gastrointestinal: Soft and nontender. No distention.  Musculoskeletal: No lower extremity tenderness nor edema.  No joint effusions. Neurologic: Slightly slurred speech consistent with intoxication.  No gross focal neurologic deficits are appreciated. Skin:  Skin is warm, dry and intact. No rash noted. Psychiatric: Mood and affect are normal.  behavior are normal.  ____________________________________________   LABS (all labs ordered are listed, but only abnormal results are displayed)  Labs Reviewed  COMPREHENSIVE METABOLIC PANEL - Abnormal; Notable for the following components:      Result Value   BUN 21 (*)    Calcium 8.7 (*)    All other components within normal limits  ETHANOL - Abnormal; Notable for the following components:   Alcohol, Ethyl (B) 74 (*)    All other components within normal limits  ACETAMINOPHEN LEVEL - Abnormal; Notable for the following components:   Acetaminophen (Tylenol), Serum <10 (*)    All other components within normal limits  CBC - Abnormal;  Notable for the following components:   WBC 10.9 (*)    All other components within normal limits  SALICYLATE LEVEL  URINE DRUG SCREEN, QUALITATIVE ([facility] ONLY)  CBG MONITORING, ED   ____________________________________________  EKG  ED ECG REPORT I, Doran Stabler, the attending physician, personally viewed and interpreted this ECG.   Date: 06/02/2017  EKG Time: 0848  Rate: 72  Rhythm: normal sinus rhythm  Axis: normal  Intervals: Nonspecific intraventricular conduction delay  ST&T Change: Patient without any obvious ST elevation or depression.  Poor baseline which is likely causing the machine to read ST elevation in inferior leads.  No abnormal T wave inversions.  ____________________________________________  [id number]  ____________________________________________   PROCEDURES  Procedure(s) performed:   Procedures  Critical Care performed:   ____________________________________________   INITIAL IMPRESSION / ASSESSMENT AND PLAN / ED COURSE  Pertinent labs & imaging results that were available during my care of the patient were reviewed by me and considered in my medical decision making (see chart for details).  DDX: Depression, suicide attempt, benzodiazepine overdose, alcoholism As part of my medical decision making, I reviewed the following data within the [place] Notes from prior ED visits  Patient aware of involuntary commitment and the need to be seen by psychiatry.  He is understanding of this plan and willing to comply.   Patient at this time is greater than 12 hours out from his overdose of benzodiazepines.  I feel that the effects of worsening now with a slurred speech are likely largely related to the alcohol use this morning and that is unlikely at this time to experience respiratory depression secondary to the benzodiazepines taken at 730 or 8 PM yesterday.  ____________________________________________   FINAL CLINICAL  IMPRESSION(S) / ED DIAGNOSES  Benzodiazepine overdose.  Alcohol abuse.  Suicide attempt    NEW MEDICATIONS STARTED DURING THIS VISIT:  This SmartLink is deprecated. Use AVSMEDLIST instead to display the medication list for a patient.   Note:  This document was prepared using Dragon voice recognition software and may include unintentional dictation errors.     Orbie Pyo, MD 06/02/17 [phone]  Patient with persistent hypoxia on room air down to 87%.  X-ray with possible right basilar infiltrate.  Re-auscultated the lungs and he is clear to auscultate throughout with expiratory cough.  Says he is a 1 pack/day smoker.  Oxygen level able to be sustained on nasal cannula.  Will be admitted to the medicine service for hypoxia and community-acquired pneumonia.  IVC in place.  Patient is aware of the need for admission and willing to comply.  Suspecting that he may have aspirated during his overdose last night causing hypoxia and pneumonia.  Signed out to Dr. Verdell Carmine.      Orbie Pyo, MD 06/02/17 1435  Patient given a breathing treatment and evaluated by Dr.Sainani.  Taken off of nasal cannula oxygen is now 95.  Patient will be switched to p.o. antibiotics and observed in the emergency department.  Pending psychiatry consult.  Updated Dr. Quentin Cornwall.     Orbie Pyo, MD 06/02/17 1537

## 2017-06-02 NOTE — Progress Notes (Signed)
Pharmacy Antibiotic Note  Andrew Carey is a 67 y.o. male admitted on 06/02/2017 with pneumonia.  Pharmacy has been consulted for Aztihromycin/ Ceftriaxone dosing.  Plan: Patient ordered Azithromycin 500mg  IV x 1 in ER and Ceftriaxone 1 gram IV x 1 in ER.  Will continue with Azithromycin 500mg  IV q24h and Ceftriaxone 1 gram IV q24h.   Height: 6' (182.9 cm) Weight: 220 lb (99.8 kg) IBW/kg (Calculated) : 77.6  Temp (24hrs), Avg:98.5 F (36.9 C), Min:98.5 F (36.9 C), Max:98.5 F (36.9 C)  Recent Labs  Lab 06/02/17 0842  WBC 10.9*  CREATININE 0.83    Estimated Creatinine Clearance: 110 mL/min (by C-G formula based on SCr of 0.83 mg/dL).    Allergies  Allergen Reactions  . Fluoxetine     Hallucinations  . Codeine Nausea And Vomiting and Nausea Only    Antimicrobials this admission: CTX  1/4 >>   Azith 1/4 >>    Dose adjustments this admission:    Microbiology results:   BCx:     UCx:      Sputum:      MRSA PCR:   1/4 CXR: Low lung volumes with mild bibasilar atelectasis. Mild right base infiltrate cannot be excluded. Small right pleural effusion noted  Thank you for allowing pharmacy to be a part of this patient's care.  Joliet Mallozzi A 06/02/2017 2:44 PM

## 2017-06-02 NOTE — ED Triage Notes (Signed)
FIRST NURSE NOTE-unsteady walking in. Placed in wheelchair. [employer] getting IVC papers. OD on up to 80 xanax last night.

## 2017-06-02 NOTE — ED Notes (Signed)
Pt arrived by [facility] [employer] with IVC papers/RN Nicola Girt [employer] security and MD Clearnce Hasten made aware.

## 2017-06-02 NOTE — ED Notes (Signed)
PT HAS $380 locked in safe. Key in PYXIS in main ED.

## 2017-06-02 NOTE — ED Notes (Signed)
Pt IVC/seen by Dr Clapacs/recommends placement when medically cleared.

## 2017-06-02 NOTE — ED Notes (Signed)
Sitting with patient

## 2017-06-02 NOTE — ED Triage Notes (Addendum)
Pt presents to ED via [place] [employer] from home with c/o ingestion of "70 1mg  Xanax" at 730-830am this morning. Pt reports taking them to "get relief" from recent stress or to "kill myself". Pt is able to answer questions, but with slow/slurred speech. [employer] officer Emogene Morgan reports IVC papers are en route. Pt also reports drinking "1/2 pint of Rum" today.

## 2017-06-02 NOTE — ED Notes (Signed)
Admitting MD at bedside.

## 2017-06-02 NOTE — ED Notes (Signed)
ER tech replaced me sitting with the patient.

## 2017-06-02 NOTE — ED Notes (Signed)
ENVIRONMENTAL ASSESSMENT  Potentially harmful objects out of patient reach: Yes.  Personal belongings secured: Yes.  Patient dressed in hospital provided attire only: Yes.  Plastic bags out of patient reach: Yes.  Patient care equipment (cords, cables, call bells, lines, and drains) shortened, removed, or accounted for: Yes.  Equipment and supplies removed from bottom of stretcher: Yes.  Potentially toxic materials out of patient reach: Yes.  Sharps container removed or out of patient reach: Yes.   BEHAVIORAL HEALTH ROUNDING  Patient sleeping: No.  Patient alert and oriented: yes  Behavior appropriate: Yes. ; If no, describe:  Nutrition and fluids offered: Yes  Toileting and hygiene offered: Yes  Sitter present: not applicable, Q 15 min safety rounds and observation via security camera. Law enforcement present: Yes ODS

## 2017-06-02 NOTE — ED Notes (Signed)
Pt states in addition to taking xanax he also took several 1mg  Klonopin that were his sisters and also drank a few beers this morning. Pt states he has hx of cirrhosis as well.

## 2017-06-02 NOTE — BH Assessment (Signed)
Patient has been accepted to [facility].  Accepting physician is Dr. Weber Cooks.  Attending Physician will be Dr. Wonda Olds.  Patient has been assigned to room 20, by [place].  Call report to [phone].  Representative/Transfer Coordinator is Rosendo Gros H Patient pre-admitted by [facility] Patient Access Genella Rife)

## 2017-06-02 NOTE — ED Notes (Signed)
Pt arrived by [facility] with IVC papers/RN Nicola Girt [employer] security and MD Clearnce Hasten made aware.

## 2017-06-02 NOTE — Consult Note (Signed)
[facility] Consult   Reason for Consult: Consult for 67 year old man with a history of alcohol abuse and cirrhosis who is here in the hospital after overdosing on prescription medicine Referring Physician: [place] Patient Identification: Andrew Carey MRN:  [id number] Principal Diagnosis: Severe recurrent major depression without psychotic features [facility]) Diagnosis:   Patient Active Problem List   Diagnosis Date Noted  . Severe recurrent major depression without psychotic features ([place]) [F33.2] 06/02/2017  . Benzodiazepine overdose [T42.4X1A] 06/02/2017  . Moderate recurrent major depression ([place]) [F33.1] 02/17/2017  . Adjustment disorder with mixed anxiety and depressed mood [F43.23] 02/17/2017  . Alcohol abuse [F10.10] 02/17/2017  . Elevated coronary artery calcium score [R93.1] 12/22/2016  . GAD (generalized anxiety disorder) [F41.1] 06/14/2016  . Chronic joint pain [M25.50, G89.29] 02/16/2016  . Pruritic condition [L29.9] 02/10/2016  . Panic disorder [F41.0] 02/10/2016  . Alcoholic cirrhosis of liver with ascites ([place]) [K70.31] 02/10/2016  . Hypotension due to drugs [I95.2] 01/19/2016    Total Time spent with patient: 1 hour  Subjective:   Andrew Carey is a 67 y.o. male patient admitted with "I have had some severe family problems and I felt hopeless".  HPI: Patient interviewed chart reviewed.  This is a 67 year old man who came to the hospital after 911 was called.  He had taken an overdose of his Xanax.  He estimates that yesterday evening he took approximately 70 of his 1 mg Xanax tablets.  He said that he felt like he was finally tired of living and that he would be better off dead.  Patient talks about some of his recent problems.  His children said some things to him that were very hurtful to him yesterday.  He also is still trying to come to terms with how severe his cirrhosis is.  Meanwhile he is still drinking intermittently.  He claims to me that he has  only had alcohol 4 days out of the last 500 but yesterday was certainly 1 of them.  He claims that he consumed about 2 pints although his blood alcohol level was under 100 by the time he got here.  Patient says he is feeling down and has suicidal thoughts.  Denies psychotic symptoms.  His only current psychiatric treatment is the Xanax he is prescribed by his primary care doctor.  Social history: Patient lives with his elderly mother and his disabled adult brother.  He is somewhat estranged from his children a fact which causes him an enormous pain.  Substance abuse history: Many years of heavy alcohol abuse which have led to cirrhosis.  Medical history: Patient has been seen by the hospitalist here in the emergency room.  There was concern initially about the patient's hypoxia but this seems to have improved.  I would guess that a big part of that was the heavy dose of Xanax.  There is no clear evidence of a pneumonia but they have recommended a few days of antibiotics and some nebulizer treatments for his COPD.  There is apparently no need for admission to medicine.  He has cirrhosis and is supposed to be taking spironolactone and Lasix and lactulose.  Past Psychiatric History: Patient says he has been treated over the years for anxiety attacks and depression and claims the only thing that helps is his Xanax.  It seems that he has been managed by his primary care doctor with standing Xanax about 3 mg a day for quite a while.  He claims that SSRIs make him feel worse.  No  previous known history of suicide attempts or hospitalization  Risk to Self: Suicidal Ideation: Yes-Currently Present Suicidal Intent: Yes-Currently Present Is patient at risk for suicide?: Yes Suicidal Plan?: Yes-Currently Present Specify Current Suicidal Plan: Pt stated he took pills "to die or to relax". Seems complacent with either outcome Access to Means: Yes Specify Access to Suicidal Means: Pt. reports to consuming 70 Xanax  and pint of alcohol this AM What has been your use of drugs/alcohol within the last 12 months?: Alcohol and benzo's How many times?: 0 Other Self Harm Risks: None reported Triggers for Past Attempts: None known Intentional Self Injurious Behavior: None Risk to Others: Homicidal Ideation: No Thoughts of Harm to Others: No Current Homicidal Intent: No Current Homicidal Plan: No Access to Homicidal Means: No Identified Victim: N/A History of harm to others?: No Assessment of Violence: None Noted Violent Behavior Description: N/A Does patient have access to weapons?: No Criminal Charges Pending?: No Does patient have a court date: No Prior Inpatient Therapy: Prior Inpatient Therapy: Yes Prior Therapy Dates: 2018-present Prior Therapy Facilty/Provider(s[facility] Reason for Treatment: Pt. reports to being terminally ill  Prior Outpatient Therapy: Prior Outpatient Therapy: Yes Prior Therapy Dates: 2018-present Prior Therapy Facilty/Provider(s): [facility] Reason for Treatment: Pt. reports to being terminally ill Does patient have an ACCT team?: No Does patient have Intensive In-House Services?  : No Does patient have [facility] services? : No Does patient have P4CC services?: No  Past Medical History:  Past Medical History:  Diagnosis Date  . Allergy   . Arthritis   . Cirrhosis ([place])   . Colon polyps   . Colorectal cancer ([place])   . Depression   . Diverticulosis   . Liver lesion   . Stroke [facility])     Past Surgical History:  Procedure Laterality Date  . COLON SURGERY    . HERNIA REPAIR     Family History:  Family History  Problem Relation Age of Onset  . Heart failure Father   . Congestive Heart Failure Father   . Heart disease Father   . Stroke Father   . Cancer Mother   . Colon polyps Mother   . Colon polyps Other   . Depression Other   . Mental illness Other    Family Psychiatric  History: Father drank heavily. Social History:   Social History   Substance and Sexual Activity  Alcohol Use Yes   Comment: 6 pack daily.       Social History   Substance and Sexual Activity  Drug Use No    Social History   Socioeconomic History  . Marital status: Widowed    Spouse name: None  . Number of children: None  . Years of education: None  . Highest education level: None  Social Needs  . Financial resource strain: None  . Food insecurity - worry: None  . Food insecurity - inability: None  . Transportation needs - medical: None  . Transportation needs - non-medical: None  Occupational History  . Occupation: Retired  Tobacco Use  . Smoking status: Current Every Day Smoker    Packs/day: 1.00    Years: 50.00    Pack years: 50.00    Types: Cigarettes  . Smokeless tobacco: Never Used  Substance and Sexual Activity  . Alcohol use: Yes    Comment: 6 pack daily.    . Drug use: No  . Sexual activity: None  Other Topics Concern  . None  Social  History Narrative  . None   Additional Social History:    Allergies:   Allergies  Allergen Reactions  . Fluoxetine     Hallucinations  . Codeine Nausea And Vomiting and Nausea Only    Labs:  Results for orders placed or performed during the hospital encounter of 06/02/17 (from the past 48 hour(s))  Comprehensive metabolic panel     Status: Abnormal   Collection Time: 06/02/17  8:42 AM  Result Value Ref Range   Sodium 138 135 - 145 mmol/L   Potassium 3.8 3.5 - 5.1 mmol/L   Chloride 104 101 - 111 mmol/L   CO2 26 22 - 32 mmol/L   Glucose, Bld 90 65 - 99 mg/dL   BUN 21 (H) 6 - 20 mg/dL   Creatinine, Ser 0.83 0.61 - 1.24 mg/dL   Calcium 8.7 (L) 8.9 - 10.3 mg/dL   Total Protein 7.2 6.5 - 8.1 g/dL   Albumin 3.7 3.5 - 5.0 g/dL   AST 21 15 - 41 U/L   ALT 19 17 - 63 U/L   Alkaline Phosphatase 89 38 - 126 U/L   Total Bilirubin 1.0 0.3 - 1.2 mg/dL   GFR calc non Af Amer >60 >60 mL/min   GFR calc Af Amer >60 >60 mL/min    Comment: (NOTE) The eGFR has been  calculated using the CKD EPI equation. This calculation has not been validated in all clinical situations. eGFR's persistently <60 mL/min signify possible Chronic Kidney Disease.    Anion gap 8 5 - 15    Comment: Performed at [facility], [address] [place], [place] [address]  Ethanol     Status: Abnormal   Collection Time: 06/02/17  8:42 AM  Result Value Ref Range   Alcohol, Ethyl (B) 74 (H) <10 mg/dL    Comment:        LOWEST DETECTABLE LIMIT FOR SERUM ALCOHOL IS 10 mg/dL FOR MEDICAL PURPOSES ONLY Performed at [facility], [address], [place], [place] [address]   Salicylate level     Status: None   Collection Time: 06/02/17  8:42 AM  Result Value Ref Range   Salicylate Lvl <2.6 2.8 - 30.0 mg/dL    Comment: Performed at [facility], [address] [place], [place] [address]  Acetaminophen level     Status: Abnormal   Collection Time: 06/02/17  8:42 AM  Result Value Ref Range   Acetaminophen (Tylenol), Serum <10 (L) 10 - 30 ug/mL    Comment:        THERAPEUTIC CONCENTRATIONS VARY SIGNIFICANTLY. A RANGE OF 10-30 ug/mL MAY BE AN EFFECTIVE CONCENTRATION FOR MANY PATIENTS. HOWEVER, SOME ARE BEST TREATED AT CONCENTRATIONS OUTSIDE THIS RANGE. ACETAMINOPHEN CONCENTRATIONS >150 ug/mL AT 4 HOURS AFTER INGESTION AND >50 ug/mL AT 12 HOURS AFTER INGESTION ARE OFTEN ASSOCIATED WITH TOXIC REACTIONS. Performed at [facility], [address] [place], [place] [address]   cbc     Status: Abnormal   Collection Time: 06/02/17  8:42 AM  Result Value Ref Range   WBC 10.9 (H) 3.8 - 10.6 K/uL   RBC 4.69 4.40 - 5.90 MIL/uL   Hemoglobin 15.1 13.0 - 18.0 g/dL   HCT 43.0 40.0 - 52.0 %   MCV 91.7 80.0 - 100.0 fL   MCH 32.1 26.0 - 34.0 pg   MCHC 35.0 32.0 - 36.0 g/dL   RDW 13.7 11.5 - 14.5 %   Platelets 170 150 - 440 K/uL    Comment: Performed  at [facility], [address], [place],  [address]    Current  Facility-Administered Medications  Medication Dose Route Frequency Provider Last Rate Last Dose  . albuterol (PROVENTIL HFA;VENTOLIN HFA) 108 (90 Base) MCG/ACT inhaler 2 puff  2 puff Inhalation Q4H PRN Shaarav Ripple, Madie Reno, MD      . Derrill Memo ON 06/03/2017] ALPRAZolam Duanne Moron) tablet 1 mg  1 mg Oral TID Naz Denunzio, Madie Reno, MD      . Derrill Memo ON 06/03/2017] furosemide (LASIX) tablet 40 mg  40 mg Oral Daily Avah Bashor T, MD      . Derrill Memo ON 06/03/2017] lactulose (CHRONULAC) 10 GM/15ML solution 20 g  20 g Oral BID Franko Hilliker T, MD      . levofloxacin (LEVAQUIN) tablet 750 mg  750 mg Oral Once Orbie Pyo, MD      . Derrill Memo ON 06/03/2017] levofloxacin (LEVAQUIN) tablet 750 mg  750 mg Oral Daily Kekoa Fyock T, MD      . LORazepam (ATIVAN) injection 0-4 mg  0-4 mg Intravenous Q6H Schaevitz, Randall An, MD       Or  . LORazepam (ATIVAN) tablet 0-4 mg  0-4 mg Oral Q6H Orbie Pyo, MD      . Derrill Memo ON 06/04/2017] LORazepam (ATIVAN) injection 0-4 mg  0-4 mg Intravenous Q12H Orbie Pyo, MD       Or  . Derrill Memo ON 06/04/2017] LORazepam (ATIVAN) tablet 0-4 mg  0-4 mg Oral Q12H Schaevitz, Randall An, MD      . nicotine (NICODERM CQ - dosed in mg/24 hours) patch 21 mg  21 mg Transdermal Daily Orbie Pyo, MD   21 mg at 06/02/17 0930  . oxyCODONE (Oxy IR/ROXICODONE) immediate release tablet 7.5 mg  7.5 mg Oral BID Casilda Pickerill, Madie Reno, MD      . Derrill Memo ON 06/03/2017] spironolactone (ALDACTONE) tablet 100 mg  100 mg Oral Daily Kieanna Rollo T, MD      . thiamine (VITAMIN B-1) tablet 100 mg  100 mg Oral Daily Diyari Cherne, Madie Reno, MD       Current Outpatient Medications  Medication Sig Dispense Refill  . ALPRAZolam (XANAX) 1 MG tablet take 1 tablet by mouth three times a day if needed for anxiety 90 tablet 5  . furosemide (LASIX) 80 MG tablet take 1 tablet by mouth once daily (Patient taking differently: take 46m by mouth once daily) 30 tablet 5  . RA VITAMIN B-1 100 MG TABS take 1  tablet by mouth once daily 100 tablet 4  . spironolactone (ALDACTONE) 100 MG tablet take 2 tablets by mouth once daily (Patient taking differently: take 1 tablets by mouth once daily) 60 tablet 12  . lactulose (CHRONULAC) 10 GM/15ML solution TAKE 30 MILLILITERS BY MOUTH THREE TIMES A DAY IF NEEDED 1800 mL 12  . mirtazapine (REMERON) 15 MG tablet Take 1 tablet (15 mg total) by mouth at bedtime. (Patient not taking: Reported on 06/02/2017) 30 tablet 1  . oxyCODONE (ROXICODONE) 15 MG immediate release tablet Take 0.5 tablets (7.5 mg total) by mouth 2 (two) times daily. 30 tablet 0    Musculoskeletal: Strength & Muscle Tone: decreased Gait & Station: unsteady Patient leans: N/A  Psychiatric Specialty Exam: Physical Exam  Nursing note and vitals reviewed. Constitutional: He appears well-developed and well-nourished. He appears distressed.  HENT:  Head: Normocephalic and atraumatic.  Eyes: Conjunctivae are normal. Pupils are equal, round, and reactive to light.  Neck: Normal range of motion.  Cardiovascular: Normal heart  sounds.  Respiratory: Effort normal. No respiratory distress.  GI: Soft. He exhibits distension.  Musculoskeletal: Normal range of motion.  Neurological: He is alert.  Skin: Skin is warm. He is diaphoretic.  Psychiatric: His affect is blunt. His speech is delayed. He is slowed. Cognition and memory are impaired. He expresses impulsivity. He exhibits a depressed mood. He expresses suicidal ideation. He expresses suicidal plans.    Review of Systems  Constitutional: Negative.   HENT: Negative.   Eyes: Negative.   Respiratory: Negative.   Cardiovascular: Negative.   Gastrointestinal: Negative.   Musculoskeletal: Positive for joint pain.  Skin: Negative.   Neurological: Negative.   Psychiatric/Behavioral: Positive for depression, memory loss, substance abuse and suicidal ideas. Negative for hallucinations. The patient is nervous/anxious and has insomnia.     Blood  pressure 109/77, pulse 72, temperature 98.2 F (36.8 C), temperature source Oral, resp. rate 20, height 6' (1.829 m), weight 99.8 kg (220 lb), SpO2 96 %.Body mass index is 29.84 kg/m.  General Appearance: Disheveled  Eye Contact:  Minimal  Speech:  Slow  Volume:  Decreased  Mood:  Depressed and Dysphoric  Affect:  Constricted  Thought Process:  Goal Directed  Orientation:  Full (Time, Place, and Person)  Thought Content:  Logical and Rumination  Suicidal Thoughts:  Yes.  with intent/plan  Homicidal Thoughts:  No  Memory:  Immediate;   Fair Recent;   Poor Remote;   Fair  Judgement:  Impaired  Insight:  Present  Psychomotor Activity:  Decreased  Concentration:  Concentration: Fair  Recall:  [employer] of Knowledge:  Fair  Language:  Fair  Akathisia:  No  Handed:  Right  AIMS (if indicated):     Assets:  Desire for Improvement Housing  ADL's:  Impaired  Cognition:  Impaired,  Mild  Sleep:        Treatment Plan Summary: Medication management and Plan 67 year old man made a serious suicide attempt by overdose of Xanax.  Has had major and ongoing stresses in his life little outpatient support continues to be very depressed.  Patient needs admission to the hospital.  He is medically cleared by the hospitalist.  We will plan to admit him to the psychiatric unit.  He will need monitoring for withdrawal.  I will continue him on his 1 mg of Xanax 3 times a day starting tomorrow morning which should be adequate to prevent any withdrawal.  He also takes chronic pain medicine which I will continue to prevent any withdrawal that causes any problems.  We will continue his spironolactone and Lasix and lactulose.  He has orders for detox but if we continue the Xanax I would hope that perhaps we will not need those.  He will be engaged in groups and have individual and group assessment for his depression.  Disposition: Recommend psychiatric Inpatient admission when medically cleared. Supportive  therapy provided about ongoing stressors.  Alethia Berthold, MD 06/02/2017 4:29 PM

## 2017-06-02 NOTE — Consult Note (Signed)
[facility] NAME: Andrew Carey    MR#:  [id number]  DATE OF BIRTH:  [birth year]  DATE OF CONSULT:  06/02/2017  PRIMARY CARE PHYSICIAN: Birdie Sons, MD   REQUESTING/REFERRING PHYSICIAN: Dr. Larae Grooms  CHIEF COMPLAINT:   Chief Complaint  Patient presents with  . Ingestion  Hypoxia  HISTORY OF PRESENT ILLNESS:  Andrew Carey  is a 67 y.o. male with a known history of colon cancer, liver cirrhosis secondary to alcohol abuse, history of previous CVA, COPD with ongoing tobacco abuse, history of osteoarthritis who presented to the hospital due to overdose of benzodiazepines. Patient apparently took about 70 tablets of 1 mg Xanax yesterday around 7:30 to 8:30 PM. Patient did have suicidal ideations but presently denies it. Patient was going to be admitted to behavioral medicine is currently under involuntary commitment hospitalist services were consulted as patient was intermittently hypoxic with O2 sats in the high 80s. Patient denies any shortness of breath, cough, fever, chills, nausea, vomiting or any other associated symptoms presently.  PAST MEDICAL HISTORY:   Past Medical History:  Diagnosis Date  . Allergy   . Arthritis   . Cirrhosis ([place])   . Colon polyps   . Colorectal cancer ([place])   . Depression   . Diverticulosis   . Liver lesion   . Stroke [facility])     PAST SURGICAL HISTOIRY:   Past Surgical History:  Procedure Laterality Date  . COLON SURGERY    . HERNIA REPAIR      SOCIAL HISTORY:   Social History   Tobacco Use  . Smoking status: Current Every Day Smoker    Packs/day: 1.00    Years: 50.00    Pack years: 50.00    Types: Cigarettes  . Smokeless tobacco: Never Used  Substance Use Topics  . Alcohol use: Yes    Comment: 6 pack daily.      FAMILY HISTORY:   Family History  Problem Relation Age of Onset  . Heart failure Father   . Congestive Heart Failure Father   . Heart disease Father   .  Stroke Father   . Cancer Mother   . Colon polyps Mother   . Colon polyps Other   . Depression Other   . Mental illness Other     DRUG ALLERGIES:   Allergies  Allergen Reactions  . Fluoxetine     Hallucinations  . Codeine Nausea And Vomiting and Nausea Only    REVIEW OF SYSTEMS:   Review of Systems  Constitutional: Negative for fever and weight loss.  HENT: Negative for congestion, nosebleeds and tinnitus.   Eyes: Negative for blurred vision, double vision and redness.  Respiratory: Negative for cough, hemoptysis and shortness of breath.   Cardiovascular: Negative for chest pain, orthopnea, leg swelling and PND.  Gastrointestinal: Negative for abdominal pain, diarrhea, melena, nausea and vomiting.  Genitourinary: Negative for dysuria, hematuria and urgency.  Musculoskeletal: Negative for falls and joint pain.  Neurological: Negative for dizziness, tingling, sensory change, focal weakness, seizures, weakness and headaches.  Endo/Heme/Allergies: Negative for polydipsia. Does not bruise/bleed easily.  Psychiatric/Behavioral: Positive for depression and suicidal ideas. Negative for memory loss. The patient is not nervous/anxious.      MEDICATIONS AT HOME:   Prior to Admission medications   Medication Sig Start Date End Date Taking? Authorizing Provider  ALPRAZolam Duanne Moron) 1 MG tablet take 1 tablet by mouth three times a day if needed for anxiety 04/19/17  Yes Birdie Sons, MD  furosemide (LASIX) 80 MG tablet take 1 tablet by mouth once daily Patient taking differently: take 40mg  by mouth once daily 06/21/16  Yes Birdie Sons, MD  RA VITAMIN B-1 100 MG TABS take 1 tablet by mouth once daily 03/05/17  Yes Birdie Sons, MD  spironolactone (ALDACTONE) 100 MG tablet take 2 tablets by mouth once daily Patient taking differently: take 1 tablets by mouth once daily 11/08/16  Yes Birdie Sons, MD  lactulose (CHRONULAC) 10 GM/15ML solution TAKE 30 MILLILITERS BY MOUTH THREE  TIMES A DAY IF NEEDED 01/31/17   Birdie Sons, MD  mirtazapine (REMERON) 15 MG tablet Take 1 tablet (15 mg total) by mouth at bedtime. Patient not taking: Reported on 06/02/2017 02/17/17   Clapacs, Madie Reno, MD  oxyCODONE (ROXICODONE) 15 MG immediate release tablet Take 0.5 tablets (7.5 mg total) by mouth 2 (two) times daily. 05/15/17   Birdie Sons, MD      VITAL SIGNS:  Blood pressure 110/68, pulse 74, temperature 98.2 F (36.8 C), temperature source Oral, resp. rate (!) 24, height 6' (1.829 m), weight 99.8 kg (220 lb), SpO2 96 %.  PHYSICAL EXAMINATION:  GENERAL:  67 y.o.-year-old unkempt patient lying in bed in no acute distress.  EYES: Pupils equal, round, reactive to light and accommodation. No scleral icterus. Extraocular muscles intact.  HEENT: Head atraumatic, normocephalic. Oropharynx and nasopharynx clear.  NECK:  Supple, no jugular venous distention. No thyroid enlargement, no tenderness.  LUNGS: Prolonged inspiratory and expiratory phase, minimal end expiratory wheezing, no rales, rhonchi. No use of accessory muscles of respiration.  CARDIOVASCULAR: S1, S2, RRR. No murmurs, rubs, gallops, clicks.  ABDOMEN: Soft, nontender, nondistended. Bowel sounds present. No organomegaly or mass.  EXTREMITIES: No pedal edema, cyanosis, or clubbing.  NEUROLOGIC: Cranial nerves II through XII are intact. No focal motor or sensory deficits appreciated bilaterally  PSYCHIATRIC: The patient is alert and oriented x 3.  SKIN: No obvious rash, lesion, or ulcer.   LABORATORY PANEL:   CBC Recent Labs  Lab 06/02/17 0842  WBC 10.9*  HGB 15.1  HCT 43.0  PLT 170   ------------------------------------------------------------------------------------------------------------------  Chemistries  Recent Labs  Lab 06/02/17 0842  NA 138  K 3.8  CL 104  CO2 26  GLUCOSE 90  BUN 21*  CREATININE 0.83  CALCIUM 8.7*  AST 21  ALT 19  ALKPHOS 89  BILITOT 1.0    ------------------------------------------------------------------------------------------------------------------  Cardiac Enzymes No results for input(s): TROPONINI in the last 168 hours. ------------------------------------------------------------------------------------------------------------------  RADIOLOGY:  Dg Chest Port 1 View  Result Date: 06/02/2017 CLINICAL DATA:  Hypoxia. EXAM: PORTABLE CHEST 1 VIEW COMPARISON:  07/04/2016. FINDINGS: Mediastinum and hilar structures normal. Cardiomegaly with normal pulmonary vascularity. Low lung volumes with mild basilar atelectasis. Mild right base infiltrate cannot be excluded. No scratched it small right pleural effusion. Left costophrenic angle incompletely imaged. IMPRESSION: 1. Low lung volumes with mild bibasilar atelectasis. Mild right base infiltrate cannot be excluded. Small right pleural effusion noted. 2. Cardiomegaly.  No pulmonary venous congestion. Electronically Signed   By: Marcello Moores  Register   On: 06/02/2017 13:00     IMPRESSION AND PLAN:   67 year old male with past medical history of previous CVA, liver cirrhosis secondary to alcohol abuse, COPD with ongoing tobacco abuse, depression who presented to the hospital due to drug overdose with patient taking over 70 pills of 1 mg Xanax as a suicide attempt and hospitalist services were contacted for hypoxia.  1. Hypoxia-I  suspect this is secondary to underlying COPD combined with atelectasis. -Chest x-ray does not show any evidence of focal infiltrate. Patient was given some breathing treatments in the ER and O2 sats have improved. I would consider giving him incentive spirometry and placing him on empiric oral antibiotics with Levaquin for a few days. We will also place him on DuoNeb's as needed for his wheezing and COPD. -Patient does not require inpatient medical admission at this time as his hypoxia has improved and his O2 sats remained stable on room air at 91-92%.  2. Drug  overdose/suicide attempt-patient apparently took 70 pills of 1 mg Xanax. -We'll watch for withdrawal/seizures. Patient is under involuntary commitment, continue further care as per psychiatry.  3. History of alcohol abuse-continue CIWA protocol.  4. History of liver cirrhosis-no clinical evidence of hepatic encephalopathy. I would continue lactulose, Lasix, Aldactone.  Thanks for the consultation.  Please call back if any further help needed.  All the records are reviewed and case discussed with Consulting provider. Management plans discussed with the patient, family and they are in agreement.  CODE STATUS: Full code  TOTAL TIME TAKING CARE OF THIS PATIENT: 45 minutes.    Henreitta Leber M.D on 06/02/2017 at 3:34 PM  Between 7am to 6pm - Pager - [phone]  After 6pm go to [email or web address] - password [email or web address] [facility]  [facility]  Office  [phone]  CC: Primary care Physician: Birdie Sons, MD

## 2017-06-02 NOTE — ED Notes (Signed)
BEHAVIORAL HEALTH ROUNDING  Patient sleeping: No.  Patient alert and oriented: yes  Behavior appropriate: Yes. ; If no, describe:  Nutrition and fluids offered: Yes  Toileting and hygiene offered: Yes  Sitter present: not applicable, Q 15 min safety rounds and observation via security camera. Law enforcement present: Yes ODS  

## 2017-06-02 NOTE — ED Notes (Signed)
Pt walked to commode in room and back with no issues.  Unable to get sample before patient done.

## 2017-06-02 NOTE — ED Notes (Signed)
Attempted to try patient on RA.  O2 dropped to 87% on RA, pt placed on 2LNC.
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
## 2017-06-02 NOTE — ED Notes (Signed)
Pt unable to void at this time. Aware we need urine specimen.

## 2017-06-02 NOTE — ED Provider Notes (Signed)
-----------------------------------------   11:59 PM on 06/02/2017 -----------------------------------------  Patient admitted to behavioral health unit.   Paulette Blanch, MD 06/02/17 [phone]

## 2017-06-03 ENCOUNTER — Inpatient Hospital Stay
Admission: AD | Admit: 2017-06-03 | Discharge: 2017-06-12 | DRG: 885 | Disposition: A | Discharge status: Home / Self Care | Payer: Medicare Other | Attending: Psychiatry | Admitting: Psychiatry

## 2017-06-03 ENCOUNTER — Other Ambulatory Visit: Payer: Self-pay

## 2017-06-03 DIAGNOSIS — F1020 Alcohol dependence, uncomplicated: Secondary | ICD-10-CM

## 2017-06-03 DIAGNOSIS — I10 Essential (primary) hypertension: Secondary | ICD-10-CM | POA: Diagnosis present

## 2017-06-03 DIAGNOSIS — T424X2A Poisoning by benzodiazepines, intentional self-harm, initial encounter: Secondary | ICD-10-CM | POA: Diagnosis present

## 2017-06-03 DIAGNOSIS — I952 Hypotension due to drugs: Secondary | ICD-10-CM

## 2017-06-03 DIAGNOSIS — F17210 Nicotine dependence, cigarettes, uncomplicated: Secondary | ICD-10-CM | POA: Diagnosis present

## 2017-06-03 DIAGNOSIS — J440 Chronic obstructive pulmonary disease with acute lower respiratory infection: Secondary | ICD-10-CM | POA: Diagnosis present

## 2017-06-03 DIAGNOSIS — M549 Dorsalgia, unspecified: Secondary | ICD-10-CM | POA: Diagnosis present

## 2017-06-03 DIAGNOSIS — F332 Major depressive disorder, recurrent severe without psychotic features: Secondary | ICD-10-CM | POA: Diagnosis present

## 2017-06-03 DIAGNOSIS — E538 Deficiency of other specified B group vitamins: Secondary | ICD-10-CM | POA: Diagnosis present

## 2017-06-03 DIAGNOSIS — J189 Pneumonia, unspecified organism: Secondary | ICD-10-CM | POA: Diagnosis present

## 2017-06-03 DIAGNOSIS — K7030 Alcoholic cirrhosis of liver without ascites: Secondary | ICD-10-CM | POA: Diagnosis present

## 2017-06-03 DIAGNOSIS — G8929 Other chronic pain: Secondary | ICD-10-CM | POA: Diagnosis present

## 2017-06-03 DIAGNOSIS — Z79899 Other long term (current) drug therapy: Secondary | ICD-10-CM | POA: Diagnosis not present

## 2017-06-03 DIAGNOSIS — F17200 Nicotine dependence, unspecified, uncomplicated: Secondary | ICD-10-CM

## 2017-06-03 LAB — LIPID PANEL
CHOLESTEROL: 202 mg/dL — AB (ref 0–200)
HDL: 36 mg/dL — ABNORMAL LOW (ref >40)
LDL CALC: 134 mg/dL — AB (ref 0–99)
TRIGLYCERIDES: 162 mg/dL — AB (ref <150)
Total CHOL/HDL Ratio: 5.6 RATIO
VLDL: 32 mg/dL (ref 0–40)

## 2017-06-03 LAB — HEMOGLOBIN A1C
HEMOGLOBIN A1C: 5 % (ref 4.8–5.6)
Mean Plasma Glucose: 96.8 mg/dL

## 2017-06-03 LAB — TSH: TSH: 2.83 u[IU]/mL (ref 0.350–4.500)

## 2017-06-03 MED ORDER — LORAZEPAM 2 MG PO TABS: 0.0000 mg | ORAL_TABLET | Freq: Two times a day (BID) | ORAL | Status: AC

## 2017-06-03 MED ORDER — LACTULOSE 10 GM/15ML PO SOLN
20.0000 g | Freq: Two times a day (BID) | ORAL | Status: DC
  Administered 2017-06-03 – 2017-06-12 (×19): 20 g via ORAL
  Filled 2017-06-03 (×19): qty 30

## 2017-06-03 MED ORDER — LEVOFLOXACIN 750 MG PO TABS
750.0000 mg | ORAL_TABLET | Freq: Every day | ORAL | Status: AC
  Administered 2017-06-03 – 2017-06-08 (×7): 750 mg via ORAL
  Filled 2017-06-03 (×8): qty 1

## 2017-06-03 MED ORDER — ACETAMINOPHEN 325 MG PO TABS: 650.0000 mg | ORAL_TABLET | Freq: Four times a day (QID) | ORAL | Status: DC | PRN

## 2017-06-03 MED ORDER — SPIRONOLACTONE 100 MG PO TABS
100.0000 mg | ORAL_TABLET | Freq: Every day | ORAL | Status: DC
  Administered 2017-06-03 – 2017-06-04 (×2): 100 mg via ORAL
  Filled 2017-06-03 (×2): qty 1

## 2017-06-03 MED ORDER — LORAZEPAM 2 MG/ML IJ SOLN: 0.0000 mg | Freq: Two times a day (BID) | INTRAMUSCULAR | Status: AC

## 2017-06-03 MED ORDER — VITAMIN B-1 100 MG PO TABS
100.0000 mg | ORAL_TABLET | Freq: Every day | ORAL | Status: DC
  Administered 2017-06-03 – 2017-06-09 (×7): 100 mg via ORAL
  Filled 2017-06-03 (×7): qty 1

## 2017-06-03 MED ORDER — TRAZODONE HCL 100 MG PO TABS
100.0000 mg | ORAL_TABLET | Freq: Every evening | ORAL | Status: DC | PRN
  Administered 2017-06-08: 100 mg via ORAL
  Filled 2017-06-03: qty 1

## 2017-06-03 MED ORDER — ALBUTEROL SULFATE HFA 108 (90 BASE) MCG/ACT IN AERS
2.0000 | INHALATION_SPRAY | RESPIRATORY_TRACT | Status: DC | PRN
  Filled 2017-06-03: qty 6.7

## 2017-06-03 MED ORDER — ALUM & MAG HYDROXIDE-SIMETH 200-200-20 MG/5ML PO SUSP: 30.0000 mL | ORAL | Status: DC | PRN

## 2017-06-03 MED ORDER — ALPRAZOLAM 0.5 MG PO TABS
0.5000 mg | ORAL_TABLET | Freq: Three times a day (TID) | ORAL | Status: DC
  Administered 2017-06-03 – 2017-06-06 (×10): 0.5 mg via ORAL
  Filled 2017-06-03 (×10): qty 1

## 2017-06-03 MED ORDER — NICOTINE 21 MG/24HR TD PT24
21.0000 mg | MEDICATED_PATCH | Freq: Every day | TRANSDERMAL | Status: DC
  Administered 2017-06-03 – 2017-06-12 (×10): 21 mg via TRANSDERMAL
  Filled 2017-06-03 (×10): qty 1

## 2017-06-03 MED ORDER — LORAZEPAM 2 MG/ML IJ SOLN: 0.0000 mg | Freq: Four times a day (QID) | INTRAMUSCULAR | Status: DC

## 2017-06-03 MED ORDER — FOLIC ACID 1 MG PO TABS
1.0000 mg | ORAL_TABLET | Freq: Every day | ORAL | Status: DC
  Administered 2017-06-03 – 2017-06-12 (×10): 1 mg via ORAL
  Filled 2017-06-03 (×10): qty 1

## 2017-06-03 MED ORDER — ALPRAZOLAM 1 MG PO TABS
1.0000 mg | ORAL_TABLET | Freq: Three times a day (TID) | ORAL | Status: DC
  Administered 2017-06-03: 1 mg via ORAL
  Filled 2017-06-03: qty 1

## 2017-06-03 MED ORDER — LORAZEPAM 2 MG PO TABS
0.0000 mg | ORAL_TABLET | Freq: Four times a day (QID) | ORAL | Status: DC
  Administered 2017-06-04 (×2): 2 mg via ORAL
  Filled 2017-06-03 (×2): qty 1

## 2017-06-03 MED ORDER — FUROSEMIDE 20 MG PO TABS
40.0000 mg | ORAL_TABLET | Freq: Every day | ORAL | Status: DC
  Administered 2017-06-03 – 2017-06-04 (×2): 40 mg via ORAL
  Filled 2017-06-03 (×2): qty 2

## 2017-06-03 MED ORDER — MAGNESIUM HYDROXIDE 400 MG/5ML PO SUSP: 30.0000 mL | Freq: Every day | ORAL | Status: DC | PRN

## 2017-06-03 MED ORDER — CITALOPRAM HYDROBROMIDE 20 MG PO TABS
20.0000 mg | ORAL_TABLET | Freq: Every day | ORAL | Status: DC
  Administered 2017-06-03 – 2017-06-05 (×3): 20 mg via ORAL
  Filled 2017-06-03 (×3): qty 1

## 2017-06-03 MED ORDER — OXYCODONE HCL 5 MG PO TABS
7.5000 mg | ORAL_TABLET | Freq: Two times a day (BID) | ORAL | Status: DC
  Administered 2017-06-03 – 2017-06-12 (×19): 7.5 mg via ORAL
  Filled 2017-06-03 (×19): qty 2

## 2017-06-03 NOTE — Plan of Care (Signed)
Patient is oriented to unit. Patient Progressing Safety: Ability to remain free from injury will improve 06/03/2017 0143 - Progressing by Anson [place], RN Education: Knowledge of [place] Education information/materials will improve 06/03/2017 0143 - Progressing by Anson [place], RN Self-Concept: Ability to disclose and discuss suicidal ideas will improve 06/03/2017 0143 - Progressing by Alyson Locket I, RN Ability to verbalize positive feelings about self will improve 06/03/2017 0143 - Progressing by Anson [place], RN   denies SI/HI/AVH at this time. Patient's safety is maintained while on unit.

## 2017-06-03 NOTE — Progress Notes (Signed)
D- Patient alert and oriented. Patient presents in a sad/depressed mood on assessment stating "I feel guilty from what I did, I'm more ashamed, and feel terrible because what I did scared the hell out of everybody around me". Patient does endorse some lower back pain rating his pain a "6/10". Patient denies SI, HI, AVH as well as depression/anxiety at this time.  A- Scheduled medications administered to patient, per MD orders. Support and encouragement provided.  Routine safety checks conducted every 15 minutes.  Patient informed to notify staff with problems or concerns.  R- No adverse drug reactions noted. Patient contracts for safety at this time. Patient compliant with medications and treatment plan. Patient receptive, calm, and cooperative. Patient interacts well with others on the unit.  Patient remains safe at this time.

## 2017-06-03 NOTE — Progress Notes (Signed)
[facility] responded to request for visit with patient. Patient was not in his room when [facility] knocked on the door. Patient was with another member of the medical staff. [place]ia will follow-up with patient at another time.
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
## 2017-06-03 NOTE — Progress Notes (Signed)
Results review indicate UDS was completed on 06/02/16.
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
## 2017-06-03 NOTE — ED Notes (Signed)
Pt IVC no esig obtained.

## 2017-06-03 NOTE — [facility] Group Notes (Signed)

## 2017-06-03 NOTE — Tx Team (Signed)
Initial Treatment Plan 06/03/2017 1:52 AM Andrew Carey [id number]    PATIENT STRESSORS: Financial difficulties Health problems Loss of Wife Marital or family conflict Substance abuse   PATIENT STRENGTHS: Active sense of humor Average or above average intelligence Capable of independent living Communication skills Motivation for treatment/growth Religious Affiliation   PATIENT IDENTIFIED PROBLEMS: Depression 06/03/17  Suicidal Ideations 06/03/17  Suicide Attempt 06/03/17  Ineffective Coping skills 06/03/17  Anxiety 06/03/17             DISCHARGE CRITERIA:  Ability to meet basic life and health needs Adequate post-discharge living arrangements Improved stabilization in mood, thinking, and/or behavior  PRELIMINARY DISCHARGE PLAN: Attend aftercare/continuing care group Attend PHP/IOP Outpatient therapy Return to previous living arrangement  PATIENT/FAMILY INVOLVEMENT: This treatment plan has been presented to and reviewed with the patient, Andrew Carey.  The patient and family have been given the opportunity to ask questions and make suggestions.  Anson [place], RN 06/03/2017, 1:52 AM

## 2017-06-03 NOTE — Progress Notes (Addendum)
D: Received patient from [facility]. Patient skin assessment completed with Coralyn Mark RN, skin is intact, no contraband found. Patient has 2cm large cyst, located on Left scapula, it is nontender to touch, no signs of inflammation or infection. Patient is a High fall risk. Patient denies SI/HI/AVH. Patient came to ED via EMS after attempting suicide by overdose. Patient took approximately 92, 1mg  Xanax that he is prescribed by PCP. Patient stated that he was "tired of living and that he would be better off dead." Patient has a history of anxiety attacks and depression. Patient has a medical history of advanced cirrhosis, colon-rectal cancer, and COPD. Patient lives with elderly mother and disabled adult brother. Patient has had some confrontations with his children and they are estranged. Patient still drinks ETOH intermittently.   A: Patient oriented to unit/room/call light. Patient was offered support and encouragement. Patient was encourage to attend groups, participate in unit activities and continue with plan of care. Q x 15 minute observation checks were completed for safety.   R: Patient has no complaints at this time. Patient is receptive to treatment and safety maintained on unit.
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
## 2017-06-03 NOTE — ED Notes (Signed)
BEHAVIORAL HEALTH ROUNDING  Patient sleeping: No.  Patient alert and oriented: yes  Behavior appropriate: Yes. ; If no, describe:  Nutrition and fluids offered: Yes  Toileting and hygiene offered: Yes  Sitter present: not applicable, Q 15 min safety rounds and observation via security camera. Law enforcement present: Yes ODS  

## 2017-06-04 LAB — VITAMIN B12: VITAMIN B 12: 230 pg/mL (ref 180–914)

## 2017-06-04 LAB — FOLATE: FOLATE: 19.8 ng/mL (ref >5.9)

## 2017-06-04 MED ORDER — SPIRONOLACTONE 50 MG PO TABS
50.0000 mg | ORAL_TABLET | Freq: Every day | ORAL | Status: DC
  Administered 2017-06-05 – 2017-06-12 (×8): 50 mg via ORAL
  Filled 2017-06-04 (×8): qty 1

## 2017-06-04 MED ORDER — VITAMIN B-12 1000 MCG PO TABS
1000.0000 ug | ORAL_TABLET | Freq: Every day | ORAL | Status: DC
  Administered 2017-06-05 – 2017-06-10 (×6): 1000 ug via ORAL
  Filled 2017-06-04 (×6): qty 1

## 2017-06-04 MED ORDER — CYANOCOBALAMIN 1000 MCG/ML IJ SOLN: 1000.0000 ug | Freq: Once | INTRAMUSCULAR | Status: DC

## 2017-06-04 MED ORDER — LORAZEPAM 2 MG PO TABS: 2.0000 mg | ORAL_TABLET | Freq: Four times a day (QID) | ORAL | Status: DC | PRN

## 2017-06-04 MED ORDER — FUROSEMIDE 20 MG PO TABS
20.0000 mg | ORAL_TABLET | Freq: Every day | ORAL | Status: DC
  Administered 2017-06-05 – 2017-06-12 (×8): 20 mg via ORAL
  Filled 2017-06-04 (×9): qty 1

## 2017-06-04 MED ORDER — LORAZEPAM 2 MG/ML IJ SOLN: 2.0000 mg | Freq: Four times a day (QID) | INTRAMUSCULAR | Status: DC | PRN

## 2017-06-04 MED ORDER — CYANOCOBALAMIN 1000 MCG/ML IJ SOLN
1000.0000 ug | Freq: Once | INTRAMUSCULAR | Status: AC
  Administered 2017-06-05: 1000 ug via INTRAMUSCULAR
  Filled 2017-06-04: qty 1

## 2017-06-04 NOTE — Progress Notes (Addendum)
Patient found in bed resting upon my arrival. Reports improvement in mood. Reports feelings of remorse regarding his overdose and the effect it has on his children and others who care for him. Patient spoke at length regarding his many regrets but states he is grateful he survived and he is determined to get the most of of the life he has left. Reports chronic back is is currently 5/10. States, "That is very normal for me. I walk around with that all the time. I'm used to it." Denies need for pain medication at this time. Encouraged patient to seek out nursing if pain becomes intolerable and medication is necessary in the future. Patient verbalized understanding. Reports eating and voiding adequately. Patient is isolative to his room through the evening and did not eat a snack. S/Sx of WD are mild and BP is low, no Ativan required at this time. Q 15 minute checks maintained. Will continue to monitor throughout the shift.  '@0000' , patient is sleeping. No S/Sx of WD noted at this time.   Patient reports sleep is restless. Reports only mild S/Sx of WD. CIWA 3. Ativan parameters not met. Will endorse care to oncoming shift.

## 2017-06-04 NOTE — Plan of Care (Signed)
  Progressing Safety: Ability to remain free from injury will improve 06/04/2017 2248 - Progressing by Derek Mound, RN Education: Knowledge of [place] Education information/materials will improve 06/04/2017 2248 - Progressing by Derek Mound, RN Emotional status will improve 06/04/2017 2248 - Progressing by Derek Mound, RN Mental status will improve 06/04/2017 2248 - Progressing by Derek Mound, RN Verbalization of understanding the information provided will improve 06/04/2017 2248 - Progressing by Derek Mound, RN Self-Concept: Ability to disclose and discuss suicidal ideas will improve 06/04/2017 2248 - Progressing by Derek Mound, RN Ability to verbalize positive feelings about self will improve 06/04/2017 2248 - Progressing by Derek Mound, RN Spiritual Needs Ability to function at adequate level 06/04/2017 2248 - Progressing by Derek Mound, RN
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
## 2017-06-04 NOTE — BHH Group Notes (Signed)
LCSW Group Therapy Note 06/04/2017 1:15pm  Type of Therapy and Topic: Group Therapy: Feelings Around Returning Home & Establishing a Supportive Framework and Supporting Oneself When Supports Not Available  Participation Level: Active  Description of Group:  Patients first processed thoughts and feelings about upcoming discharge. These included fears of upcoming changes, lack of change, new living environments, judgements and expectations from others and overall stigma of mental health issues. The group then discussed the definition of a supportive framework, what that looks and feels like, and how do to discern it from an unhealthy non-supportive network. The group identified different types of supports as well as what to do when your family/friends are less than helpful or unavailable  Therapeutic Goals  1. Patient will identify one healthy supportive network that they can use at discharge. 2. Patient will identify one factor of a supportive framework and how to tell it from an unhealthy network. 3. Patient able to identify one coping skill to use when they do not have positive supports from others. 4. Patient will demonstrate ability to communicate their needs through discussion and/or role plays.  Summary of Patient Progress:  Pt engaged during group session. As patients processed their anxiety about discharge and described healthy supports patient shared he feels "pretty good". Patients identified at least one self-care tool they were willing to use after discharge.   Therapeutic Modalities Cognitive Behavioral Therapy Motivational Interviewing   Meredith Kilbride  CUEBAS-COLON, LCSW 06/04/2017 10:00 AM

## 2017-06-04 NOTE — Progress Notes (Signed)
Patient states that he is generally doing well with his medicines and depression symptoms have improved but he still feels down at times due to his family issue. Patient is restful and sleep long hours. Patient denies any plans of suicide ideations or thoughts , he thinks group participation has helped with coping mechanisms. Appetite is good, no distress noted.
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
## 2017-06-04 NOTE — Progress Notes (Signed)
Patient ID: Andrew Carey, male   DOB: [birth year], 67 y.o.   MRN: [id number]    D: Pt has been very flat and depressed on the unit today.  Pt did complain of some pain, anxiety, and withdrawal symptoms this morning. Medication was given pt reported that medication helped. Pt took all medication without any problems. Pts blood pressure was low this morning, Dr. Nicolasa Ducking was made aware new orders noted to check pts vital every 4 hours. Pt reported the he remains very depressed, and very hopeless. Pt reported being negative SI/HI, no AH/VH noted. A: 15 min checks continued for patient safety. R: Pt safety maintained.

## 2017-06-05 MED ORDER — DESVENLAFAXINE SUCCINATE ER 50 MG PO TB24
50.0000 mg | ORAL_TABLET | Freq: Every day | ORAL | Status: DC
  Administered 2017-06-06 – 2017-06-12 (×7): 50 mg via ORAL
  Filled 2017-06-05 (×7): qty 1

## 2017-06-05 NOTE — [facility] Suicide Risk Assessment (Signed)
[facility] INPATIENT:  Family/Significant Other Suicide Prevention Education  Suicide Prevention Education:  Contact Attempts: Pt's mother, Jef Futch at [phone],  has been identified by the patient as the family member/significant other with whom the patient will be residing, and identified as the person(s) who will aid the patient in the event of a mental health crisis.  With written consent from the patient, two attempts were made to provide suicide prevention education, prior to and/or following the patient's discharge.  We were unsuccessful in providing suicide prevention education.  A suicide education pamphlet was given to the patient to share with family/significant other.  Date and time of first attempt: 06/04/17--Annia, weekend social worker  Date and time of second attempt: 06/05/17 at 9:43AM  Alden Hipp, [place] 06/05/2017, 9:43 AM

## 2017-06-05 NOTE — Plan of Care (Signed)
Affect slightly constricted.  Denies SI/HI/AVH.  Attended treatment team meeting today.  Able to verbalize that he wanted to get  back involved with AA and have outpatient treatment once discharged.  Further stated that wants to have improvement in his mood and to develop better coping skills.  Receptive to treatment regiment.  Support and encouragement offered.  Safety rounds maintained.

## 2017-06-05 NOTE — Progress Notes (Signed)
[place] present for treatment team meeting. Patient stated he has good support system in place, and will follow-up with AA meetings, hospice counseling, and any other help that can be provided to assist him in his goal of being different then when he came into the hospital.

## 2017-06-05 NOTE — Progress Notes (Signed)
Recreation Therapy Notes  INPATIENT RECREATION THERAPY ASSESSMENT  Patient Details Name: Colson Barco MRN: [id number] DOB: April 18, 1951 Today's Date: 06-20-2017  Patient Stressors: Death  Coping Skills:   (Walking, Watching TV, Reading)  Personal Challenges: Concentration, Stress Management  Leisure Interests (2+):  Petra Kuba - Programmer, multimedia gardening, Games - Mining engineer games, Social - Family  Awareness of Community Resources:  Yes  Community Resources:  Park  Current Use: Yes  If no, Barriers?:    Patient Strengths:  Determined, Surveyor, mining, Polite  Patient Identified Areas of Improvement:  My patience  Current Recreation Participation:  Walk  Patient Goal for Hospitalization:  Be different from when I came in  [place] of Residence:  [place] of Residence:  [place]   Current SI (including self-harm):  No  Current HI:  No  Consent to Intern Participation: N/A   Greydis Stlouis 2017/06/20, 4:08 PM

## 2017-06-05 NOTE — Tx Team (Signed)
Interdisciplinary Treatment and Diagnostic Plan Update  06/05/2017 Time of Session: [place] [facility] MRN: [id number]  Principal Diagnosis: <principal problem not specified>  Secondary Diagnoses: Active Problems:   Severe recurrent major depression without psychotic features ([place])   Uncomplicated alcohol dependence ([facility])   Tobacco use disorder   Current Medications:  Current Facility-Administered Medications  Medication Dose Route Frequency Provider Last Rate Last Dose  . acetaminophen (TYLENOL) tablet 650 mg  650 mg Oral Q6H PRN Clapacs, John T, MD      . albuterol (PROVENTIL HFA;VENTOLIN HFA) 108 (90 Base) MCG/ACT inhaler 2 puff  2 puff Inhalation Q4H PRN Clapacs, John T, MD      . ALPRAZolam Duanne Moron) tablet 0.5 mg  0.5 mg Oral TID Chauncey Mann, MD   0.5 mg at 06/05/17 0911  . alum & mag hydroxide-simeth (MAALOX/MYLANTA) 200-200-20 MG/5ML suspension 30 mL  30 mL Oral Q4H PRN Clapacs, John T, MD      . citalopram (CELEXA) tablet 20 mg  20 mg Oral Daily Chauncey Mann, MD   20 mg at 06/05/17 0912  . [START ON 06/06/2017] cyanocobalamin ((VITAMIN B-12)) injection 1,000 mcg  1,000 mcg Intramuscular Once Chauncey Mann, MD      . cyanocobalamin ((VITAMIN B-12)) injection 1,000 mcg  1,000 mcg Intramuscular Once Chauncey Mann, MD      . folic acid (FOLVITE) tablet 1 mg  1 mg Oral Daily Chauncey Mann, MD   1 mg at 06/05/17 0912  . furosemide (LASIX) tablet 20 mg  20 mg Oral Daily Chauncey Mann, MD   20 mg at 06/05/17 0912  . lactulose (CHRONULAC) 10 GM/15ML solution 20 g  20 g Oral BID Clapacs, Madie Reno, MD   20 g at 06/05/17 0914  . levofloxacin (LEVAQUIN) tablet 750 mg  750 mg Oral Daily Clapacs, Madie Reno, MD   750 mg at 06/04/17 1637  . LORazepam (ATIVAN) injection 0-4 mg  0-4 mg Intravenous Q12H Clapacs, Madie Reno, MD       Or  . LORazepam (ATIVAN) tablet 0-4 mg  0-4 mg Oral Q12H Clapacs, John T, MD      . LORazepam (ATIVAN) tablet 2 mg  2 mg Oral Q6H PRN Chauncey Mann, MD       Or   . LORazepam (ATIVAN) injection 2 mg  2 mg Intramuscular Q6H PRN Chauncey Mann, MD      . magnesium hydroxide (MILK OF MAGNESIA) suspension 30 mL  30 mL Oral Daily PRN Clapacs, John T, MD      . nicotine (NICODERM CQ - dosed in mg/24 hours) patch 21 mg  21 mg Transdermal Daily Clapacs, Madie Reno, MD   21 mg at 06/05/17 0913  . oxyCODONE (Oxy IR/ROXICODONE) immediate release tablet 7.5 mg  7.5 mg Oral BID Clapacs, John T, MD   7.5 mg at 06/05/17 0911  . spironolactone (ALDACTONE) tablet 50 mg  50 mg Oral Daily Chauncey Mann, MD   50 mg at 06/05/17 0912  . thiamine (VITAMIN B-1) tablet 100 mg  100 mg Oral Daily Clapacs, Madie Reno, MD   100 mg at 06/05/17 0912  . traZODone (DESYREL) tablet 100 mg  100 mg Oral QHS PRN Clapacs, John T, MD      . vitamin B-12 (CYANOCOBALAMIN) tablet 1,000 mcg  1,000 mcg Oral Daily Chauncey Mann, MD   1,000 mcg at 06/05/17 6301   PTA Medications: Medications Prior to Admission  Medication Sig Dispense  Refill Last Dose  . ALPRAZolam (XANAX) 1 MG tablet take 1 tablet by mouth three times a day if needed for anxiety 90 tablet 5 06/02/2017 at Unknown time  . furosemide (LASIX) 80 MG tablet take 1 tablet by mouth once daily (Patient taking differently: take 40mg  by mouth once daily) 30 tablet 5 Past Week at Unknown time  . lactulose (CHRONULAC) 10 GM/15ML solution TAKE 30 MILLILITERS BY MOUTH THREE TIMES A DAY IF NEEDED 1800 mL 12 prn at prn  . mirtazapine (REMERON) 15 MG tablet Take 1 tablet (15 mg total) by mouth at bedtime. (Patient not taking: Reported on 06/02/2017) 30 tablet 1 Not Taking at Unknown time  . oxyCODONE (ROXICODONE) 15 MG immediate release tablet Take 0.5 tablets (7.5 mg total) by mouth 2 (two) times daily. 30 tablet 0 prn at prn  . RA VITAMIN B-1 100 MG TABS take 1 tablet by mouth once daily 100 tablet 4 Past Week at Unknown time  . spironolactone (ALDACTONE) 100 MG tablet take 2 tablets by mouth once daily (Patient taking differently: take 1 tablets by mouth  once daily) 60 tablet 12 Past Week at Unknown time    Patient Stressors: Financial difficulties Health problems Loss of Wife Marital or family conflict Substance abuse  Patient Strengths: Active sense of humor Average or above average intelligence Capable of independent living Communication skills Motivation for treatment/growth Religious Affiliation  Treatment Modalities: Medication Management, Group therapy, Case management,  1 to 1 session with clinician, Psychoeducation, Recreational therapy.   Physician Treatment Plan for Primary Diagnosis: <principal problem not specified> Long Term Goal(s): Improvement in symptoms so as ready for discharge Improvement in symptoms so as ready for discharge   Short Term Goals: Ability to identify changes in lifestyle to reduce recurrence of condition will improve Ability to disclose and discuss suicidal ideas Ability to identify triggers associated with substance abuse/mental health issues will improve Ability to identify changes in lifestyle to reduce recurrence of condition will improve Ability to verbalize feelings will improve Ability to disclose and discuss suicidal ideas Compliance with prescribed medications will improve Ability to identify triggers associated with substance abuse/mental health issues will improve  Medication Management: Evaluate patient's response, side effects, and tolerance of medication regimen.  Therapeutic Interventions: 1 to 1 sessions, Unit Group sessions and Medication administration.  Evaluation of Outcomes: Progressing  Physician Treatment Plan for Secondary Diagnosis: Active Problems:   Severe recurrent major depression without psychotic features ([place])   Uncomplicated alcohol dependence ([place])   Tobacco use disorder  Long Term Goal(s): Improvement in symptoms so as ready for discharge Improvement in symptoms so as ready for discharge   Short Term Goals: Ability to identify changes in lifestyle to  reduce recurrence of condition will improve Ability to disclose and discuss suicidal ideas Ability to identify triggers associated with substance abuse/mental health issues will improve Ability to identify changes in lifestyle to reduce recurrence of condition will improve Ability to verbalize feelings will improve Ability to disclose and discuss suicidal ideas Compliance with prescribed medications will improve Ability to identify triggers associated with substance abuse/mental health issues will improve     Medication Management: Evaluate patient's response, side effects, and tolerance of medication regimen.  Therapeutic Interventions: 1 to 1 sessions, Unit Group sessions and Medication administration.  Evaluation of Outcomes: Progressing   RN Treatment Plan for Primary Diagnosis: <principal problem not specified> Long Term Goal(s): Knowledge of disease and therapeutic regimen to maintain health will improve  Short Term Goals: Ability to  verbalize feelings will improve, Ability to identify and develop effective coping behaviors will improve and Compliance with prescribed medications will improve  Medication Management: RN will administer medications as ordered by provider, will assess and evaluate patient's response and provide education to patient for prescribed medication. RN will report any adverse and/or side effects to prescribing provider.  Therapeutic Interventions: 1 on 1 counseling sessions, Psychoeducation, Medication administration, Evaluate responses to treatment, Monitor vital signs and CBGs as ordered, Perform/monitor CIWA, COWS, AIMS and Fall Risk screenings as ordered, Perform wound care treatments as ordered.  Evaluation of Outcomes: Progressing   LCSW Treatment Plan for Primary Diagnosis: <principal problem not specified> Long Term Goal(s): Safe transition to appropriate next level of care at discharge, Engage patient in therapeutic group addressing interpersonal  concerns.  Short Term Goals: Engage patient in aftercare planning with referrals and resources, Identify triggers associated with mental health/substance abuse issues and Increase skills for wellness and recovery  Therapeutic Interventions: Assess for all discharge needs, 1 to 1 time with Social worker, Explore available resources and support systems, Assess for adequacy in community support network, Educate family and significant other(s) on suicide prevention, Complete Psychosocial Assessment, Interpersonal group therapy.  Evaluation of Outcomes: Progressing   Progress in Treatment: Attending groups: Yes. Participating in groups: Yes. Taking medication as prescribed: Yes. Toleration medication: Yes. Family/Significant other contact made: No, will contact:  attempted contact with pt's sister x 2. Patient understands diagnosis: Yes. Discussing patient identified problems/goals with staff: Yes. Medical problems stabilized or resolved: Yes. Denies suicidal/homicidal ideation: Yes. Issues/concerns per patient self-inventory: No. Other: None   New problem(s) identified: No, Describe:  None at this time.   New Short Term/Long Term Goal(s): Pt reported his goal for treatment is to, "be different than when I came in. I want to go to [facility]. I want to keep getting counseling."   Discharge Plan or Barriers: Pt will discharge home and will continue treatment in the [facility] setting with [place].   Reason for Continuation of Hospitalization: Depression Medication stabilization  Estimated Length of Stay: 3-5 days   Recreational Therapy: Patient Stressors: Death Patient Goal: Patient will identify 3 positive ways of expressing themselves x5 days.   Attendees: Patient: Andrew Carey 06/05/2017 11:23 AM  Physician: Dr. Wonda Olds, MD 06/05/2017 11:23 AM  Nursing: Elige Radon, RN 06/05/2017 11:23 AM  RN Care Manager: 06/05/2017 11:23 AM  Social Worker: Alden Hipp, LCSW 06/05/2017 11:23 AM   Recreational Therapist: Roanna Epley, CTRS-LRT 06/05/2017 11:23 AM  Other: Darin Engels, [place]  06/05/2017 11:23 AM  Other:  06/05/2017 11:23 AM  Other: 06/05/2017 11:23 AM    Scribe for Treatment Team: Alden Hipp, LCSW 06/05/2017 11:23 AM

## 2017-06-05 NOTE — [facility] Group Notes (Signed)
06/05/2017 9:30AM  Type of Therapy and Topic:  Group Therapy:  Overcoming Obstacles  Participation Level:  Active    Description of Group:    In this group patients will be encouraged to explore what they see as obstacles to their own wellness and recovery. They will be guided to discuss their thoughts, feelings, and behaviors related to these obstacles. The group will process together ways to cope with barriers, with attention given to specific choices patients can make. Each patient will be challenged to identify changes they are motivated to make in order to overcome their obstacles. This group will be process-oriented, with patients participating in exploration of their own experiences as well as giving and receiving support and challenge from other group members.   Therapeutic Goals: 1. Patient will identify personal and current obstacles as they relate to admission. 2. Patient will identify barriers that currently interfere with their wellness or overcoming obstacles.  3. Patient will identify feelings, thought process and behaviors related to these barriers. 4. Patient will identify two changes they are willing to make to overcome these obstacles:      Summary of Patient Progress Patient was encouraged and invited to attend group. Patient did not attend group. Social worker will continue to encourage group participation in the future.     Therapeutic Modalities:   Cognitive Behavioral Therapy Solution Focused Therapy Motivational Interviewing Relapse Prevention Therapy    Darin Engels MSW, [place] 06/05/2017 10:26 AM

## 2017-06-05 NOTE — Progress Notes (Addendum)
[facility] MD Progress Note  06/05/2017 11:33 AM Andrew Carey  MRN:  [id number]   Subjective:  History was reviewed with patient. He discusses that he has had trouble with his children after his divorce from their mother. HE states that he felt like he was not there for them when he should have been. His son lives in [place] so he does not get to see him often. HE was in [place] visiting his sister and his son was coming to town. They got into a conversation about the past and arguing at times. He states that things escalated and they both were very upset. HE states that he has been sad that his son is so far away. He got to thinking about his cirrhosis and that he may not seen him many more times. HE states that when his son left he felt "That I lost him completely." HE states that his son told him "Don't call me. I'll call you." Because he needed time to process things. HE states that he came back to [place] and was "So hurt and crushed."  HE states that he had been sober for 507 days but that n ight he relapsed and drank a pint of rum. HE states that he felt even more depressed and states "I felt like I didn't have any more to lose." HE took 70 tablets of Xanax which is what he had left in the bottle. His sister came home from the store and found him slurring his words and she called 911. He states that at the time he was "furious with her for calling." He states that looking back on the attempt now he "made a big mistake." He states that he regrets that decision to overdose. HE states, "It was just in that moment that I felt hopeless." He states that he was not feeling suicidal prior to it and this was not something that he was planning.  We processed how alcohol can really disinhibit someone's' thinking. HE states that he "has a second chance." and "I've really had a change of heart."  HE states that he has been doing a lot of meditating and thinking while in the hospital and "has a loose plan for  meeting my goals."  He states, "I finally can see some possibility of repair with my kids." He states that the wants to try to "repair my relationships with my kids and family." He states that he has end stage cirrhosis and states, "Whey did I want to cut my life even shorter by overdosing. I was rushing the process. I need to use my time here to do something good." PT states that he also wants to start attending [place] meeting again as these were really helpful for him. HE has been seeing a hospice counselor about 1 time a month and they have been working on grief counselor> He states that he has never really gotten over the death of his second wife. He states that he has been on many SSRIs and has had various side effects to them including "I just feel like I"m in a bubble with them." He denies any manic episodes with them.  Pt has good insight into himself and his symptoms and things he needs to do to get well. HE denies SI or any thoughts of self harm.   Principal Problem: Severe recurrent major depression without psychotic features ([place]) Diagnosis:   Patient Active Problem List   Diagnosis Date Noted  . Uncomplicated alcohol dependence ([place]) [  F10.20]   . Tobacco use disorder [F17.200]   . Severe recurrent major depression without psychotic features ([place]) [F33.2] 06/02/2017  . Benzodiazepine overdose [T42.4X1A] 06/02/2017  . Moderate recurrent major depression ([place]) [F33.1] 02/17/2017  . Adjustment disorder with mixed anxiety and depressed mood [F43.23] 02/17/2017  . Alcohol abuse [F10.10] 02/17/2017  . Elevated coronary artery calcium score [R93.1] 12/22/2016  . GAD (generalized anxiety disorder) [F41.1] 06/14/2016  . Chronic joint pain [M25.50, G89.29] 02/16/2016  . Pruritic condition [L29.9] 02/10/2016  . Panic disorder [F41.0] 02/10/2016  . Alcoholic cirrhosis of liver with ascites ([place]) [K70.31] 02/10/2016  . Hypotension due to drugs [I95.2] 01/19/2016   Total Time spent with patient: 35  minutes Past Psychiatric History: See H&P  Past Medical History:  Past Medical History:  Diagnosis Date  . Allergy   . Arthritis   . Cirrhosis ([place])   . Colon polyps   . Colorectal cancer ([place])   . Depression   . Diverticulosis   . Liver lesion   . Stroke [facility])     Past Surgical History:  Procedure Laterality Date  . COLON SURGERY    . HERNIA REPAIR     Family History:  Family History  Problem Relation Age of Onset  . Heart failure Father   . Congestive Heart Failure Father   . Heart disease Father   . Stroke Father   . Cancer Mother   . Colon polyps Mother   . Colon polyps Other   . Depression Other   . Mental illness Other    Family Psychiatric  History: See H&P Social History:  Social History   Substance and Sexual Activity  Alcohol Use Yes   Comment: 6 pack daily.       Social History   Substance and Sexual Activity  Drug Use No    Social History   Socioeconomic History  . Marital status: Widowed    Spouse name: None  . Number of children: None  . Years of education: None  . Highest education level: None  Social Needs  . Financial resource strain: None  . Food insecurity - worry: None  . Food insecurity - inability: None  . Transportation needs - medical: None  . Transportation needs - non-medical: None  Occupational History  . Occupation: Retired  Tobacco Use  . Smoking status: Current Every Day Smoker    Packs/day: 1.00    Years: 50.00    Pack years: 50.00    Types: Cigarettes  . Smokeless tobacco: Never Used  Substance and Sexual Activity  . Alcohol use: Yes    Comment: 6 pack daily.    . Drug use: No  . Sexual activity: None  Other Topics Concern  . None  Social History Narrative  . None   Additional Social History:                         Sleep: Fair  Appetite:  Fair  Current Medications: Current Facility-Administered Medications  Medication Dose Route Frequency Provider Last Rate Last Dose  . acetaminophen  (TYLENOL) tablet 650 mg  650 mg Oral Q6H PRN Clapacs, John T, MD      . albuterol (PROVENTIL HFA;VENTOLIN HFA) 108 (90 Base) MCG/ACT inhaler 2 puff  2 puff Inhalation Q4H PRN Clapacs, John T, MD      . ALPRAZolam Duanne Moron) tablet 0.5 mg  0.5 mg Oral TID Chauncey Mann, MD   0.5 mg at 06/05/17 0911  .  alum & mag hydroxide-simeth (MAALOX/MYLANTA) 200-200-20 MG/5ML suspension 30 mL  30 mL Oral Q4H PRN Clapacs, John T, MD      . citalopram (CELEXA) tablet 20 mg  20 mg Oral Daily Chauncey Mann, MD   20 mg at 06/05/17 0912  . [START ON 06/06/2017] cyanocobalamin ((VITAMIN B-12)) injection 1,000 mcg  1,000 mcg Intramuscular Once Chauncey Mann, MD      . cyanocobalamin ((VITAMIN B-12)) injection 1,000 mcg  1,000 mcg Intramuscular Once Chauncey Mann, MD      . folic acid (FOLVITE) tablet 1 mg  1 mg Oral Daily Chauncey Mann, MD   1 mg at 06/05/17 0912  . furosemide (LASIX) tablet 20 mg  20 mg Oral Daily Chauncey Mann, MD   20 mg at 06/05/17 0912  . lactulose (CHRONULAC) 10 GM/15ML solution 20 g  20 g Oral BID Clapacs, Madie Reno, MD   20 g at 06/05/17 0914  . levofloxacin (LEVAQUIN) tablet 750 mg  750 mg Oral Daily Clapacs, Madie Reno, MD   750 mg at 06/04/17 1637  . LORazepam (ATIVAN) injection 0-4 mg  0-4 mg Intravenous Q12H Clapacs, Madie Reno, MD       Or  . LORazepam (ATIVAN) tablet 0-4 mg  0-4 mg Oral Q12H Clapacs, John T, MD      . LORazepam (ATIVAN) tablet 2 mg  2 mg Oral Q6H PRN Chauncey Mann, MD       Or  . LORazepam (ATIVAN) injection 2 mg  2 mg Intramuscular Q6H PRN Chauncey Mann, MD      . magnesium hydroxide (MILK OF MAGNESIA) suspension 30 mL  30 mL Oral Daily PRN Clapacs, John T, MD      . nicotine (NICODERM CQ - dosed in mg/24 hours) patch 21 mg  21 mg Transdermal Daily Clapacs, Madie Reno, MD   21 mg at 06/05/17 0913  . oxyCODONE (Oxy IR/ROXICODONE) immediate release tablet 7.5 mg  7.5 mg Oral BID Clapacs, John T, MD   7.5 mg at 06/05/17 0911  . spironolactone (ALDACTONE) tablet 50 mg  50 mg Oral  Daily Chauncey Mann, MD   50 mg at 06/05/17 0912  . thiamine (VITAMIN B-1) tablet 100 mg  100 mg Oral Daily Clapacs, Madie Reno, MD   100 mg at 06/05/17 0912  . traZODone (DESYREL) tablet 100 mg  100 mg Oral QHS PRN Clapacs, John T, MD      . vitamin B-12 (CYANOCOBALAMIN) tablet 1,000 mcg  1,000 mcg Oral Daily Chauncey Mann, MD   1,000 mcg at 06/05/17 4742    Lab Results:  Results for orders placed or performed during the hospital encounter of 06/03/17 (from the past 48 hour(s))  Vitamin B12     Status: None   Collection Time: 06/04/17  6:41 AM  Result Value Ref Range   Vitamin B-12 230 180 - 914 pg/mL    Comment: (NOTE) This assay is not validated for testing neonatal or myeloproliferative syndrome specimens for Vitamin B12 levels. Performed at [facility], [place] [address], [place], [place] [address]   Folate     Status: None   Collection Time: 06/04/17  6:41 AM  Result Value Ref Range   Folate 19.8 >5.9 ng/mL    Comment: Performed at [facility], [address] [place], [place] [address]    Blood Alcohol level:  Lab Results  Component Value Date   ETH 74 (H) 06/02/2017  ETH 97 (H) 16/02/9603    Metabolic Disorder Labs: Lab Results  Component Value Date   HGBA1C 5.0 06/03/2017   MPG 96.8 06/03/2017   No results found for: PROLACTIN Lab Results  Component Value Date   CHOL 202 (H) 06/03/2017   TRIG 162 (H) 06/03/2017   HDL 36 (L) 06/03/2017   CHOLHDL 5.6 06/03/2017   VLDL 32 06/03/2017   LDLCALC 134 (H) 06/03/2017    Physical Findings: AIMS:  , ,  ,  ,    CIWA:  CIWA-Ar Total: 3 COWS:     Musculoskeletal: Strength & Muscle Tone: within normal limits Gait & Station: normal Patient leans: N/A  Psychiatric Specialty Exam: Physical Exam  Nursing note and vitals reviewed.   Review of Systems  All other systems reviewed and are negative.   Blood pressure 96/73, pulse 92, temperature 98.6 F (37 C), temperature source Oral, resp. rate 18,  height 6' (1.829 m), weight 100.7 kg (222 lb), SpO2 96 %.Body mass index is 30.11 kg/m.  General Appearance: Disheveled  Eye Contact:  Good  Speech:  Clear and Coherent  Volume:  Decreased  Mood:  Depressed  Affect:  Tearful  Thought Process:  Coherent and Goal Directed  Orientation:  Full (Time, Place, and Person)  Thought Content:  Negative  Suicidal Thoughts:  No  Homicidal Thoughts:  No  Memory:  Immediate;   Fair  Judgement:  Fair  Insight:  Fair  Psychomotor Activity:  Normal  Concentration:  Concentration: Fair  Recall:  [employer] of Knowledge:  Fair  Language:  Fair  Akathisia:  No      Assets:  Communication Skills Desire for Improvement Housing Social Support  ADL's:  Intact  Cognition:  WNL  Sleep:  Number of Hours: 7     Treatment Plan Summary: 67 yo male admitted after suicide attempt by overdosing on Xanax. Pt reports symptoms of depression but has significant guilt and remorse about suicide attempt. He is future oriented and looking to repair some of his relationships in his life. He has briefly tried various SSRIs and SNRI but does not appear he has ever really adequately had a trial as he voiced various vague side effects to many of them.   Plan:  MDD -d/c Celexa due to age and no ability to increase further if needed -Start Pristiq.This may be better tolerated due to severe cirrhosis  Alcohol use disorder-recent relapse -Pt was [facility] to [facility] and he plans to go to [place] meetings  Recent Xanax overdose -Will continue to taper off Xanax -Decrease to 0.25 mg TID  Cirrhosis -Continue spironolactone and lasix  Low B12 -Continue B12 injections to help with depression  Dispo -Pt will return home on discharge and follow up with [facility]  Greater than 50% of face to face time with patient was spent on counseling and coordination of care. We discussed including Pristiq and mechanism of action, supportive therapy, follow up plan and goals Marylin Crosby, MD 06/05/2017, 11:33 AM

## 2017-06-05 NOTE — Progress Notes (Signed)
Recreation Therapy Notes  Date: 01.07.2019  Time: 1:00pm  Location: Craft Room  Behavioral response: Appropriate  Intervention Topic: Problem Solving  Discussion/Intervention: Group content on today was focused on problem solving. The group described what problem solving is. Patients expressed how problems affect them and how they deal with problems. Individuals identified healthy ways to deal with problems. Patients explained what normally happens to them when they do not deal with problems. The group expressed reoccurring problems for them. The group participated in the intervention "Put the story together" with their peers and worked together to put a story that was broken up in the correct order. Clinical Observations/Feedback:  Patient came to group late due to unknown reasons. He participated in the intervention and was social with peers and staff during group.  Kinzie Wickes LRT/CTRS         Coston Mandato 06/05/2017 3:03 PM

## 2017-06-06 MED ORDER — GABAPENTIN 600 MG PO TABS
300.0000 mg | ORAL_TABLET | Freq: Three times a day (TID) | ORAL | Status: DC
  Administered 2017-06-06 – 2017-06-07 (×3): 300 mg via ORAL
  Filled 2017-06-06 (×3): qty 1

## 2017-06-06 MED ORDER — ARIPIPRAZOLE 5 MG PO TABS
5.0000 mg | ORAL_TABLET | Freq: Every day | ORAL | Status: DC
  Administered 2017-06-07 – 2017-06-09 (×3): 5 mg via ORAL
  Filled 2017-06-06 (×3): qty 1

## 2017-06-06 MED ORDER — ALPRAZOLAM 0.25 MG PO TABS
0.2500 mg | ORAL_TABLET | Freq: Three times a day (TID) | ORAL | Status: DC
  Administered 2017-06-06 – 2017-06-08 (×5): 0.25 mg via ORAL
  Filled 2017-06-06 (×5): qty 1

## 2017-06-06 NOTE — Progress Notes (Signed)
Recreation Therapy Notes   Date: 01.08.2019  Time: 1:00 PM  Location: Craft Room  Behavioral response: Appropriate  Intervention Topic: Team work  Discussion/Intervention: Group content on today was focused on teamwork. The group identified what teamwork is. Individuals described who is a part of their team. Patients expressed why they thought teamwork is important. The group stated reasons why they thought it was easier to work with a Dance movement psychotherapist team. Individuals discussed some positives and negatives of working with a team. Patients gave examples of past experiences they had while working with a team. The group participated in the intervention "Save the [place]", patients were in groups and had to keep the balls on the surface given.  Clinical Observations/Feedback:  Patient came to group and stated cross training is key in team work. He stated great team work can lead to raises. Individual participated in the intervention and continues to work towards his goal.  Pharmacist, community Derick Seminara LRT/CTRS         Tejasvi Brissett 06/06/2017 2:06 PM

## 2017-06-06 NOTE — BHH Group Notes (Signed)
06/06/2017 0930  Type of Therapy/Topic:  Group Therapy:  Feelings about Diagnosis  Participation Level:  Active   Description of Group:   This group will allow patients to explore their thoughts and feelings about diagnoses they have received. Patients will be guided to explore their level of understanding and acceptance of these diagnoses. Facilitator will encourage patients to process their thoughts and feelings about the reactions of others to their diagnosis and will guide patients in identifying ways to discuss their diagnosis with significant others in their lives. This group will be process-oriented, with patients participating in exploration of their own experiences, giving and receiving support, and processing challenge from other group members.   Therapeutic Goals: 1. Patient will demonstrate understanding of diagnosis as evidenced by identifying two or more symptoms of the disorder 2. Patient will be able to express two feelings regarding the diagnosis 3. Patient will demonstrate their ability to communicate their needs through discussion and/or role play  Summary of Patient Progress: Pt continues to work towards their tx goals but has not yet reached them. Pt was able to appropriately participate in group discussion, and was able to offer support/validation to other group members. Pt reported, "I've been diagnosed with depression and anxiety. When I was diagnosed with depression, I thought sadness was a normal reaction to what I'd been going through, so I don't know if I necessarily agree with that. But, I know I have anxiety." Pt reported an understanding of his diagnosis as well. Pt reported, "I feel acceptance and hopeful because research is always progressing."    Therapeutic Modalities:   Cognitive Behavioral Therapy Brief Therapy  Alden Hipp, MSW, LCSW 06/06/2017 10:27 AM

## 2017-06-06 NOTE — Plan of Care (Signed)
  Progressing Safety: Ability to remain free from injury will improve 06/06/2017 0132 - Progressing by Derek Mound, RN Education: Knowledge of [place] Education information/materials will improve 06/06/2017 0132 - Progressing by Derek Mound, RN Emotional status will improve 06/06/2017 0132 - Progressing by Derek Mound, RN Mental status will improve 06/06/2017 0132 - Progressing by Derek Mound, RN Verbalization of understanding the information provided will improve 06/06/2017 0132 - Progressing by Derek Mound, RN Self-Concept: Ability to disclose and discuss suicidal ideas will improve 06/06/2017 0132 - Progressing by Derek Mound, RN Ability to verbalize positive feelings about self will improve 06/06/2017 0132 - Progressing by Derek Mound, RN Spiritual Needs Ability to function at adequate level 06/06/2017 0132 - Progressing by Derek Mound, RN [facility] Participation in Recreation Therapeutic Interventions STG-Other Recreation Therapy Goal (Specify) Description Patient will identify 3 positive ways of expressing themselves x5 days.  06/06/2017 0132 - Progressing by Derek Mound, RN

## 2017-06-06 NOTE — BHH Suicide Risk Assessment (Signed)
[place] INPATIENT:  Family/Significant Other Suicide Prevention Education  Suicide Prevention Education:  Education Completed; Toribio Harbour, pt's sister, at [phone]  has been identified by the patient as the family member/significant other with whom the patient will be residing, and identified as the person(s) who will aid the patient in the event of a mental health crisis (suicidal ideations/suicide attempt).  With written consent from the patient, the family member/significant other has been provided the following suicide prevention education, prior to the and/or following the discharge of the patient.  The suicide prevention education provided includes the following:  Suicide risk factors  Suicide prevention and interventions  National Suicide Hotline telephone number  [facility] assessment telephone number  [facility] Emergency Assistance [place] and/or Residential Mobile Crisis Unit telephone number  Request made of family/significant other to:  Remove weapons (e.g., guns, rifles, knives), all items previously/currently identified as safety concern.    Remove drugs/medications (over-the-counter, prescriptions, illicit drugs), all items previously/currently identified as a safety concern.  The family member/significant other verbalizes understanding of the suicide prevention education information provided.  The family member/significant other agrees to remove the items of safety concern listed above.  Pt's sister added, "He's been depressed his whole life. When he's good, he's really, really good. When he's drunk, he really scares [place]. We don't want him to come home too soon--we want to make sure whatever medications you're giving him are working. If it's an antidepressant, if he doesn't like it, I'm afraid he'll just go off of it and start drinking again. I really thought we were going to lose him again. I have a feeling that when he gets home, he'll  say he doesn't need anything and just stay in the bed. Unless he's in a place where he just has to do it, I don't think he'll do it. I think he'll just end up walking to the liquor store. I know it's all up to him, but I want him to have some coping skills."    Alden Hipp, LCSW  06/06/2017, 10:33 AM

## 2017-06-06 NOTE — Progress Notes (Addendum)
Patient found reading in bed upon my arrival. Reports working on focusing on relationships in his life rather than remorse over his suicide attempt. States, "It's done, there's nothing I can do to change that but now I need to focus on fixing my relationships, especially with my children. Lucky for me, children are more forgiving of [place] than we expect them to be." Patient talked at length about wanting to do the right thing from now on and not waste the time he has left. Patient is taking advantage of time here to attempt to work through anger and regret in order not to drink when he is discharged. Denies SI, HI, AVH, depression, and anxiety. Reports improvement daily. Reports back pain is 4/10, patient verbalized that this is tolerable and requires no pain intervention. Patient is cooperative and talkative throughout assessment. Mood and affect are brighter. Denies S/Sx of WD. Patient is neither visible nor social with peers throughout the evening. Q 15 minute checks maintained. Will continue to monitor throughout the shift. Patient slept "off and on" throughout the night. Reports chronic pain is at a tolerable level. BP is low, given Gatorade and encouraged to push fluids and stay out of bed for a while. Patient is asymptomatic. Will endorse care to oncoming shift.

## 2017-06-06 NOTE — Progress Notes (Signed)
Patient is pleasant and cooperative in the unit.Denies SI,HI and AVH.Patient states "I feel like I am loosing my energy."Appropriate with staff & peers.Compliant with medications.Attended groups.Appetite and good.Support and encouragement given.

## 2017-06-06 NOTE — Plan of Care (Signed)
  Progressing Safety: Ability to remain free from injury will improve 06/06/2017 2343 - Progressing by Clemens Catholic, RN 06/06/2017 2335 - Progressing by Clemens Catholic, RN Education: Emotional status will improve 06/06/2017 2343 - Progressing by Clemens Catholic, RN 06/06/2017 2335 - Progressing by Clemens Catholic, RN Mental status will improve 06/06/2017 2343 - Progressing by Clemens Catholic, RN 06/06/2017 2335 - Progressing by Clemens Catholic, RN Verbalization of understanding the information provided will improve 06/06/2017 2335 - Progressing by Clemens Catholic, RN Self-Concept: Ability to disclose and discuss suicidal ideas will improve 06/06/2017 2343 - Progressing by Clemens Catholic, RN

## 2017-06-06 NOTE — Progress Notes (Addendum)
[facility] MD Progress Note  06/06/2017 2:51 PM Andrew Carey  MRN:  [id number] Subjective:  Pt states that he has been enjoying groups and getting a lot out of them. HE states that he has noticed that he has started to feel much more positive about life and the future. He states that he knows that he is not sure how much longer he has due to cirrhosis but he wants to continue living to "make the most out of it." He states that he has 2 grandkids and he hopes to see his son have grandkids. He states that he still plans to go to [employer] and get sponsor again. We discussed about SAIOP and he is going to think about that. He states that he will likely try AA first and have that in mind if he needs higher level of care. When asked, he denies SI or thoughts of self harm. HE states that  happened in such a small window." Meaning the suicide attempt. HE states that it was more of an impulsive thing and he was drinking which made it more likely that it happened. He continues to express remorse and guilt around the suicide attempt. He is tolerating Pristiq and states, "This is the first one I haven't felt weird on."  I spoke with his sister, Florentina Jenny. She states that mental illness and alcohol use runs in the family. She states that pt has been drinking for many years to self medicate. She states that he has struggled with depression and anxiety for most of his life. He has tried SSRIs in the past and "had psychotic episodes." She feels that he should be on a mood stabilizer. She states that he has been suicidal for many years and prior to this attempt he wrote a letter to his kids about suicide. She states, "He is very much stuck in the past. "  Principal Problem: Severe recurrent major depression without psychotic features ([place]) Diagnosis:   Patient Active Problem List   Diagnosis Date Noted  . Severe recurrent major depression without psychotic features ([place]) [F33.2] 06/02/2017    Priority: High  .  Uncomplicated alcohol dependence ([place]) [F10.20]   . Tobacco use disorder [F17.200]   . Benzodiazepine overdose [T42.4X1A] 06/02/2017  . Moderate recurrent major depression ([place]) [F33.1] 02/17/2017  . Adjustment disorder with mixed anxiety and depressed mood [F43.23] 02/17/2017  . Alcohol abuse [F10.10] 02/17/2017  . Elevated coronary artery calcium score [R93.1] 12/22/2016  . GAD (generalized anxiety disorder) [F41.1] 06/14/2016  . Chronic joint pain [M25.50, G89.29] 02/16/2016  . Pruritic condition [L29.9] 02/10/2016  . Panic disorder [F41.0] 02/10/2016  . Alcoholic cirrhosis of liver with ascites ([place]) [K70.31] 02/10/2016  . Hypotension due to drugs [I95.2] 01/19/2016   Total Time spent with patient: 20 minutes  Past Psychiatric History: See H&P  Past Medical History:  Past Medical History:  Diagnosis Date  . Allergy   . Arthritis   . Cirrhosis ([place])   . Colon polyps   . Colorectal cancer ([place])   . Depression   . Diverticulosis   . Liver lesion   . Stroke [facility])     Past Surgical History:  Procedure Laterality Date  . COLON SURGERY    . HERNIA REPAIR     Family History:  Family History  Problem Relation Age of Onset  . Heart failure Father   . Congestive Heart Failure Father   . Heart disease Father   . Stroke Father   . Cancer Mother   .  Colon polyps Mother   . Colon polyps Other   . Depression Other   . Mental illness Other    Family Psychiatric  History: See H&P Social History:  Social History   Substance and Sexual Activity  Alcohol Use Yes   Comment: 6 pack daily.       Social History   Substance and Sexual Activity  Drug Use No    Social History   Socioeconomic History  . Marital status: Widowed    Spouse name: None  . Number of children: None  . Years of education: None  . Highest education level: None  Social Needs  . Financial resource strain: None  . Food insecurity - worry: None  . Food insecurity - inability: None  .  Transportation needs - medical: None  . Transportation needs - non-medical: None  Occupational History  . Occupation: Retired  Tobacco Use  . Smoking status: Current Every Day Smoker    Packs/day: 1.00    Years: 50.00    Pack years: 50.00    Types: Cigarettes  . Smokeless tobacco: Never Used  Substance and Sexual Activity  . Alcohol use: Yes    Comment: 6 pack daily.    . Drug use: No  . Sexual activity: None  Other Topics Concern  . None  Social History Narrative  . None   Additional Social History:                         Sleep: Good  Appetite:  Good  Current Medications: Current Facility-Administered Medications  Medication Dose Route Frequency Provider Last Rate Last Dose  . acetaminophen (TYLENOL) tablet 650 mg  650 mg Oral Q6H PRN Clapacs, John T, MD      . albuterol (PROVENTIL HFA;VENTOLIN HFA) 108 (90 Base) MCG/ACT inhaler 2 puff  2 puff Inhalation Q4H PRN Clapacs, John T, MD      . ALPRAZolam Duanne Moron) tablet 0.5 mg  0.5 mg Oral TID Chauncey Mann, MD   0.5 mg at 06/06/17 1203  . alum & mag hydroxide-simeth (MAALOX/MYLANTA) 200-200-20 MG/5ML suspension 30 mL  30 mL Oral Q4H PRN Clapacs, John T, MD      . cyanocobalamin ((VITAMIN B-12)) injection 1,000 mcg  1,000 mcg Intramuscular Once Chauncey Mann, MD      . cyanocobalamin ((VITAMIN B-12)) injection 1,000 mcg  1,000 mcg Intramuscular Once Chauncey Mann, MD      . desvenlafaxine (PRISTIQ) 24 hr tablet 50 mg  50 mg Oral Daily Maris Bena, Tyson Babinski, MD   50 mg at 06/06/17 0826  . folic acid (FOLVITE) tablet 1 mg  1 mg Oral Daily Chauncey Mann, MD   1 mg at 06/06/17 1497  . furosemide (LASIX) tablet 20 mg  20 mg Oral Daily Chauncey Mann, MD   20 mg at 06/06/17 0825  . lactulose (CHRONULAC) 10 GM/15ML solution 20 g  20 g Oral BID Clapacs, Madie Reno, MD   20 g at 06/06/17 0825  . levofloxacin (LEVAQUIN) tablet 750 mg  750 mg Oral Daily Clapacs, Madie Reno, MD   750 mg at 06/06/17 0825  . LORazepam (ATIVAN) injection 0-4 mg   0-4 mg Intravenous Q12H Clapacs, Madie Reno, MD       Or  . LORazepam (ATIVAN) tablet 0-4 mg  0-4 mg Oral Q12H Clapacs, John T, MD      . LORazepam (ATIVAN) tablet 2 mg  2 mg Oral Q6H PRN Nicolasa Ducking,  Steva Colder, MD       Or  . LORazepam (ATIVAN) injection 2 mg  2 mg Intramuscular Q6H PRN Chauncey Mann, MD      . magnesium hydroxide (MILK OF MAGNESIA) suspension 30 mL  30 mL Oral Daily PRN Clapacs, John T, MD      . nicotine (NICODERM CQ - dosed in mg/24 hours) patch 21 mg  21 mg Transdermal Daily Clapacs, Madie Reno, MD   21 mg at 06/06/17 0825  . oxyCODONE (Oxy IR/ROXICODONE) immediate release tablet 7.5 mg  7.5 mg Oral BID Clapacs, Madie Reno, MD   7.5 mg at 06/06/17 0824  . spironolactone (ALDACTONE) tablet 50 mg  50 mg Oral Daily Chauncey Mann, MD   50 mg at 06/06/17 0829  . thiamine (VITAMIN B-1) tablet 100 mg  100 mg Oral Daily Clapacs, Madie Reno, MD   100 mg at 06/06/17 0825  . traZODone (DESYREL) tablet 100 mg  100 mg Oral QHS PRN Clapacs, John T, MD      . vitamin B-12 (CYANOCOBALAMIN) tablet 1,000 mcg  1,000 mcg Oral Daily Chauncey Mann, MD   1,000 mcg at 06/06/17 9381    Lab Results: No results found for this or any previous visit (from the past 48 hour(s)).  Blood Alcohol level:  Lab Results  Component Value Date   ETH 74 (H) 06/02/2017   ETH 97 (H) 01/75/1025    Metabolic Disorder Labs: Lab Results  Component Value Date   HGBA1C 5.0 06/03/2017   MPG 96.8 06/03/2017   No results found for: PROLACTIN Lab Results  Component Value Date   CHOL 202 (H) 06/03/2017   TRIG 162 (H) 06/03/2017   HDL 36 (L) 06/03/2017   CHOLHDL 5.6 06/03/2017   VLDL 32 06/03/2017   LDLCALC 134 (H) 06/03/2017    Physical Findings: AIMS:  , ,  ,  ,    CIWA:  CIWA-Ar Total: 2 COWS:     Musculoskeletal: Strength & Muscle Tone: within normal limits Gait & Station: normal Patient leans: N/A  Psychiatric Specialty Exam: Physical Exam  ROS  Blood pressure 101/71, pulse 88, temperature 98.2 F (36.8 C),  temperature source Oral, resp. rate 20, height 6' (1.829 m), weight 100.7 kg (222 lb), SpO2 95 %.Body mass index is 30.11 kg/m.  General Appearance: Casual  Eye Contact:  Good  Speech:  Clear and Coherent  Volume:  Normal  Mood:  Euthymic, smiling more today  Affect:  Congruent  Thought Process:  Coherent and Goal Directed  Orientation:  Full (Time, Place, and Person)  Thought Content:  Logical  Suicidal Thoughts:  No  Homicidal Thoughts:  No  Memory:  Immediate;   Fair  Judgement:  Good  Insight:  Good  Psychomotor Activity:  Normal  Concentration:  Concentration: Good  Recall:  Good  Fund of Knowledge:  Good  Language:  Good  Akathisia:  No      Assets:  Communication Skills Desire for Improvement Financial Resources/Insurance Housing Resilience  ADL's:  Intact  Cognition:  WNL  Sleep:  Number of Hours: 5.25     Treatment Plan Summary: 67 yo male admitted after suicide attempt by overdosing on Xanax while intoxicated. HE has great insight into his symptoms and things he wants to do to improve. He has been attending groups and doing very well in them. HE is much more future oriented.  Plan:  MDD -Continue Pristiq 50 mg daily -Will start Abilify 5 mg daily for  augmentation of depression and anxiety.    Alcohol use disorder-recent relapse -He plans to continue AA and possible [facility]IOP at [facility] -Will start Gabapentin 300 mg TID for anxiety, cravings and to assist in Xanax taper  Recent Xanax overdose -Will continue to taper off Xanax. This was decreased yesterday. He has had seizures coming off Xanax in the past  Cirrhosis -Continue Spironolactone and lasix  Dispo -He will return home on discharge and follow up with [place], MD 06/06/2017, 2:51 PM

## 2017-06-06 NOTE — Plan of Care (Signed)
  Progressing Safety: Ability to remain free from injury will improve 06/06/2017 2335 - Progressing by Clemens Catholic, RN Education: Emotional status will improve 06/06/2017 2335 - Progressing by Clemens Catholic, RN Mental status will improve 06/06/2017 2335 - Progressing by Clemens Catholic, RN Verbalization of understanding the information provided will improve 06/06/2017 2335 - Progressing by Clemens Catholic, RN

## 2017-06-06 NOTE — Plan of Care (Signed)
Patient progressing in all areas. 

## 2017-06-07 MED ORDER — GABAPENTIN 600 MG PO TABS
600.0000 mg | ORAL_TABLET | Freq: Three times a day (TID) | ORAL | Status: DC
  Administered 2017-06-07 – 2017-06-09 (×5): 600 mg via ORAL
  Filled 2017-06-07 (×5): qty 1

## 2017-06-07 NOTE — Progress Notes (Signed)
Patient is adjusting well in the unit, stated " I"m doing much better than when I was admitted,things are looking good" patient endorses for safety of self and others no distress noted, denies SI/HI   And AVH

## 2017-06-07 NOTE — Plan of Care (Signed)
Patient is progressing in all areas. 

## 2017-06-07 NOTE — Progress Notes (Signed)
Patient is pleasant and cooperative in the unit.Patient set plans to keep himself busy at home.Patient verbalized that he thinks he is on the right medications now because he does not have any side effects.Appropriate with staff & peers.Compliant with medications.Attended groups.Appetite and energy level good.Support and encouragement given.

## 2017-06-07 NOTE — [facility] Group Notes (Signed)
  06/07/2017  Time: 0930  Type of Therapy/Topic:  Group Therapy:  Emotion Regulation  Participation Level:  Active   Description of Group:    The purpose of this group is to assist patients in learning to regulate negative emotions and experience positive emotions. Patients will be guided to discuss ways in which they have been vulnerable to their negative emotions. These vulnerabilities will be juxtaposed with experiences of positive emotions or situations, and patients will be challenged to use positive emotions to combat negative ones. Special emphasis will be placed on coping with negative emotions in conflict situations, and patients will process healthy conflict resolution skills.  Therapeutic Goals: 1. Patient will identify two positive emotions or experiences to reflect on in order to balance out negative emotions 2. Patient will label two or more emotions that they find the most difficult to experience 3. Patient will demonstrate positive conflict resolution skills through discussion and/or role plays  Summary of Patient Progress: Pt continues to work towards their tx goals but has not yet reached them. Pt was able to appropriately participate in group discussion, and was able to offer support/validation to other group members. Pt reports feeling, "jittery and anxious today. I'm not sure why--it may be because of a medication switch." Pt reported when he is feeling angry, or strong emotions in general, he utilizes "walking away or being outside," in order to stay calm in the moment.    Therapeutic Modalities:   Cognitive Behavioral Therapy Feelings Identification Dialectical Behavioral Therapy   Alden Hipp, MSW, LCSW 06/07/2017 10:22 AM

## 2017-06-07 NOTE — [facility] Group Notes (Signed)
[place] Group Notes:  (Nursing/MHT/Case Management/Adjunct)  Date:  06/07/2017  Time:  11:47 PM  Type of Therapy:  Group Therapy  Participation Level:  Active  Participation Quality:  Appropriate  Affect:  Appropriate  Cognitive:  Alert  Insight:  Good  Engagement in Group:  Engaged  Modes of Intervention:  Support  Summary of Progress/Problems:  Andrew Carey 06/07/2017, 11:47 PM

## 2017-06-07 NOTE — Plan of Care (Signed)
  Progressing Safety: Ability to remain free from injury will improve 06/07/2017 2212 - Progressing by Clemens Catholic, RN Education: Knowledge of [place] Education information/materials will improve 06/07/2017 2212 - Progressing by Clemens Catholic, RN Emotional status will improve 06/07/2017 2212 - Progressing by Clemens Catholic, RN Mental status will improve 06/07/2017 2212 - Progressing by Clemens Catholic, RN Self-Concept: Ability to verbalize positive feelings about self will improve 06/07/2017 2212 - Progressing by Clemens Catholic, RN [facility] Participation in Recreation Therapeutic Interventions STG-Other Recreation Therapy Goal (Specify) Description Patient will identify 3 positive ways of expressing themselves x5 days.  06/07/2017 2212 - Progressing by Clemens Catholic, RN

## 2017-06-07 NOTE — [facility] Group Notes (Signed)
[place] Group Notes:  (Nursing/MHT/Case Management/Adjunct)  Date:  06/07/2017  Time:  3:27 PM  Type of Therapy:  Psychoeducational Skills  Participation Level:  Minimal  Participation Quality:  Appropriate and Attentive  Affect:  Appropriate  Cognitive:  Appropriate  Insight:  Appropriate and Good  Engagement in Group:  Engaged  Modes of Intervention:  Discussion and Education  Summary of Progress/Problems:  Andrew Carey 06/07/2017, 3:27 PM

## 2017-06-07 NOTE — Progress Notes (Signed)
Recreation Therapy Notes   Date: 01.09.2019  Time: 1:00PM  Location: Craft Room  Behavioral response: Appropriate  Intervention Topic: Creative Expressions  Discussion/Intervention: Group content on today was focused on creative expressions. The group defined creative expressions and ways they use creative expressions. Individual identified other positive ways creative expressions can be used and why it is important to express yourself. Patients participated in the intervention "learning origami", where they had a chance to learn new ways to creatively express themselves. Clinical Observations/Feedback:  Patient came to group and stated he plays guitar, charcoal paints, gardens and fish as a way to creatively express himself. He participated in the intervention and was social with peers and staff during group. Individual continues to make progress towards goal. Jannine Abreu LRT/CTRS         Leviticus Harton 06/07/2017 2:27 PM

## 2017-06-07 NOTE — Progress Notes (Addendum)
[facility] MD Progress Note  06/07/2017 3:14 PM Andrew Carey  MRN:  [id number] Subjective:  Pt states that he is doing well. He has been very active in group participation and he is pleasantly surprised how much he is enjoying them. He has excellent insight into himself and things he wants to change in his life. He states that his counselor told him that he should volunteer at [employer] in helping to talk to people there. He has strongly been considering this and processes how he feels this could really help himself and other people. HE states that he used to work really hard and when he stopped working he felt a loss of purpose. He is going to keep trying to Reach out to [employer] to see about volunteering. He states that his appetite is better today. He did not have much of an appetite yesterday but ate most of his breakfast today. He continues to feel guilty for the suicide attempt and states, "I was in a place that I never knew existed. That is scary." HE remembers writing a letter to his son but states it wasn't a suicide letter but is was describing to him that he felt a sense of distance between them. Pt states that feels like he is tolerating hi medications and is excited that these are the first medications that he didn't have a reaction to. He states that he plans to call his sister today but feels "embarrassed about the suicide attempt." Processed with him about these feelings. HE states that he would like for her to visit.  I spoke with hi sister, Andrew Carey again today to update her on hi progress. I also encouraged her to visit her brother and felt this would help strengthen his support system. She states that she will come visit.   Principal Problem: Severe recurrent major depression without psychotic features ([place]) Diagnosis:   Patient Active Problem List   Diagnosis Date Noted  . Severe recurrent major depression without psychotic features ([place]) [F33.2] 06/02/2017    Priority:  High  . Uncomplicated alcohol dependence ([place]) [F10.20]   . Tobacco use disorder [F17.200]   . Benzodiazepine overdose [T42.4X1A] 06/02/2017  . Moderate recurrent major depression ([place]) [F33.1] 02/17/2017  . Adjustment disorder with mixed anxiety and depressed mood [F43.23] 02/17/2017  . Alcohol abuse [F10.10] 02/17/2017  . Elevated coronary artery calcium score [R93.1] 12/22/2016  . GAD (generalized anxiety disorder) [F41.1] 06/14/2016  . Chronic joint pain [M25.50, G89.29] 02/16/2016  . Pruritic condition [L29.9] 02/10/2016  . Panic disorder [F41.0] 02/10/2016  . Alcoholic cirrhosis of liver with ascites ([place]) [K70.31] 02/10/2016  . Hypotension due to drugs [I95.2] 01/19/2016   Total Time spent with patient: 35 minutes  Past Psychiatric History: See H&P  Past Medical History:  Past Medical History:  Diagnosis Date  . Allergy   . Arthritis   . Cirrhosis ([place])   . Colon polyps   . Colorectal cancer ([place])   . Depression   . Diverticulosis   . Liver lesion   . Stroke [facility])     Past Surgical History:  Procedure Laterality Date  . COLON SURGERY    . HERNIA REPAIR     Family History:  Family History  Problem Relation Age of Onset  . Heart failure Father   . Congestive Heart Failure Father   . Heart disease Father   . Stroke Father   . Cancer Mother   . Colon polyps Mother   . Colon polyps Other   .  Depression Other   . Mental illness Other    Family Psychiatric  History: See H&P Social History:  Social History   Substance and Sexual Activity  Alcohol Use Yes   Comment: 6 pack daily.       Social History   Substance and Sexual Activity  Drug Use No    Social History   Socioeconomic History  . Marital status: Widowed    Spouse name: None  . Number of children: None  . Years of education: None  . Highest education level: None  Social Needs  . Financial resource strain: None  . Food insecurity - worry: None  . Food insecurity - inability: None  .  Transportation needs - medical: None  . Transportation needs - non-medical: None  Occupational History  . Occupation: Retired  Tobacco Use  . Smoking status: Current Every Day Smoker    Packs/day: 1.00    Years: 50.00    Pack years: 50.00    Types: Cigarettes  . Smokeless tobacco: Never Used  Substance and Sexual Activity  . Alcohol use: Yes    Comment: 6 pack daily.    . Drug use: No  . Sexual activity: None  Other Topics Concern  . None  Social History Narrative  . None   Additional Social History:                         Sleep: Good  Appetite:  Good  Current Medications: Current Facility-Administered Medications  Medication Dose Route Frequency Provider Last Rate Last Dose  . acetaminophen (TYLENOL) tablet 650 mg  650 mg Oral Q6H PRN Clapacs, John T, MD      . albuterol (PROVENTIL HFA;VENTOLIN HFA) 108 (90 Base) MCG/ACT inhaler 2 puff  2 puff Inhalation Q4H PRN Clapacs, John T, MD      . ALPRAZolam Duanne Moron) tablet 0.25 mg  0.25 mg Oral TID Marylin Crosby, MD   0.25 mg at 06/07/17 1229  . alum & mag hydroxide-simeth (MAALOX/MYLANTA) 200-200-20 MG/5ML suspension 30 mL  30 mL Oral Q4H PRN Clapacs, John T, MD      . ARIPiprazole (ABILIFY) tablet 5 mg  5 mg Oral Daily , Tyson Babinski, MD   5 mg at 06/07/17 0821  . desvenlafaxine (PRISTIQ) 24 hr tablet 50 mg  50 mg Oral Daily , Tyson Babinski, MD   50 mg at 06/07/17 0823  . folic acid (FOLVITE) tablet 1 mg  1 mg Oral Daily Chauncey Mann, MD   1 mg at 06/07/17 2376  . furosemide (LASIX) tablet 20 mg  20 mg Oral Daily Chauncey Mann, MD   20 mg at 06/07/17 2831  . gabapentin (NEURONTIN) tablet 300 mg  300 mg Oral TID Marylin Crosby, MD   300 mg at 06/07/17 1229  . lactulose (CHRONULAC) 10 GM/15ML solution 20 g  20 g Oral BID Clapacs, Madie Reno, MD   20 g at 06/07/17 5176  . levofloxacin (LEVAQUIN) tablet 750 mg  750 mg Oral Daily Clapacs, John T, MD   750 mg at 06/06/17 1657  . magnesium hydroxide (MILK OF MAGNESIA)  suspension 30 mL  30 mL Oral Daily PRN Clapacs, John T, MD      . nicotine (NICODERM CQ - dosed in mg/24 hours) patch 21 mg  21 mg Transdermal Daily Clapacs, Madie Reno, MD   21 mg at 06/07/17 1607  . oxyCODONE (Oxy IR/ROXICODONE) immediate release tablet 7.5 mg  7.5  mg Oral BID Clapacs, Madie Reno, MD   7.5 mg at 06/07/17 8676  . spironolactone (ALDACTONE) tablet 50 mg  50 mg Oral Daily Chauncey Mann, MD   50 mg at 06/07/17 0823  . thiamine (VITAMIN B-1) tablet 100 mg  100 mg Oral Daily Clapacs, Madie Reno, MD   100 mg at 06/07/17 1950  . traZODone (DESYREL) tablet 100 mg  100 mg Oral QHS PRN Clapacs, John T, MD      . vitamin B-12 (CYANOCOBALAMIN) tablet 1,000 mcg  1,000 mcg Oral Daily Chauncey Mann, MD   1,000 mcg at 06/07/17 9326    Lab Results: No results found for this or any previous visit (from the past 48 hour(s)).  Blood Alcohol level:  Lab Results  Component Value Date   ETH 74 (H) 06/02/2017   ETH 97 (H) 71/24/5809    Metabolic Disorder Labs: Lab Results  Component Value Date   HGBA1C 5.0 06/03/2017   MPG 96.8 06/03/2017   No results found for: PROLACTIN Lab Results  Component Value Date   CHOL 202 (H) 06/03/2017   TRIG 162 (H) 06/03/2017   HDL 36 (L) 06/03/2017   CHOLHDL 5.6 06/03/2017   VLDL 32 06/03/2017   LDLCALC 134 (H) 06/03/2017    Physical Findings: AIMS:  , ,  ,  ,    CIWA:  CIWA-Ar Total: 2 COWS:     Musculoskeletal: Strength & Muscle Tone: within normal limits Gait & Station: normal Patient leans: N/A  Psychiatric Specialty Exam: Physical Exam  Nursing note and vitals reviewed.   ROS  Blood pressure 98/73, pulse 96, temperature 98.6 F (37 C), temperature source Oral, resp. rate 20, height 6' (1.829 m), weight 100.7 kg (222 lb), SpO2 96 %.Body mass index is 30.11 kg/m.  General Appearance: Casual  Eye Contact:  Good  Speech:  Clear and Coherent  Volume:  Normal  Mood:  Depressed but greatly improving  Affect:  Appropriate, smiling more today   Thought Process:  Coherent and Goal Directed  Orientation:  Full (Time, Place, and Person)  Thought Content:  Logical  Suicidal Thoughts:  No  Homicidal Thoughts:  No  Memory:  Immediate;   Good  Judgement:  Good  Insight:  Good  Psychomotor Activity:  Normal  Concentration:  Concentration: Good  Recall:  Good  Fund of Knowledge:  Good  Language:  Good  Akathisia:  No      Assets:  Communication Skills Desire for Improvement Resilience Social Support  ADL's:  Intact  Cognition:  WNL  Sleep:  Number of Hours: 8     Treatment Plan Summary: 67 yo male admitted after suicide attempt by overdosing on Xanax. Pt is highly active in groups and is really getting a lot of skills from them. He has great insight into himself and things he needs to do to improve his life. He has been tolerating his medications well. Affect is much brighter and is smiling today.   Plan:  MDD -Continue Pristiq 50 mg daily -Continue Abilify Abilify 5 mg daily  Recent Xanax overdose -Continue to taper off Xanax -Will continue 0.25 mg TID for now and will likely decrease further tomorrow or Friday -Increase Gabapentin to 600 mg TID to prevent seizures and to help with anxiety  Alcohol use disorder -HE will go to [facility] at [facility] and AA  Cirrhosis -Continue Spironolactone and Lasix  Dispo -HE will return home on discharge and follow up with [facility]. PT also has [facility]  so will discuss with care manager about placing consult  Greater than 50% of face to face time with patient was spent on counseling and coordination of care. We discussed medication including Abilify and Gabapentin. Discussed goals and future plans and development of coping skills.  I also spoke to his sister about medications  Marylin Crosby, MD 06/07/2017, 3:14 PM

## 2017-06-08 MED ORDER — CLONAZEPAM 0.5 MG PO TABS
0.2500 mg | ORAL_TABLET | Freq: Three times a day (TID) | ORAL | Status: DC
  Administered 2017-06-08 – 2017-06-12 (×13): 0.25 mg via ORAL
  Filled 2017-06-08 (×13): qty 1

## 2017-06-08 NOTE — Progress Notes (Signed)
Recreation Therapy Notes   Date: 01.10.2019  Time: 1:00 PM   Location: Craft Room  Behavioral response: Appropriate  Intervention Topic: Stress  Discussion/Intervention: Group content on today was focused on stress. The group defined stress and way to cope with stress. Participants expressed how they know when they are stresses out. Individuals described the different ways they have to cope with stress. The group stated reasons why it is important to cope with stress. Patient explained what good stress is and some examples. The group participated in the intervention "Dealing with stress". Individuals had a chance to discuss positive and negative ways to deal with certain stressful situations.  Clinical Observations/Feedback:  Patient came to group and was focused on the topic and what his peers and staff had to say. He participated in the intervention and was social with peers and staff during group. Individual continues to work toward his goal.  Pharmacist, community Makya Yurko LRT/CTRS            Darsh Vandevoort 06/08/2017 1:52 PM

## 2017-06-08 NOTE — Progress Notes (Signed)
Consult for [facility] case management completed via  [facility] for Andrew Carey as per Dr. Lasandra Beech request. I received a call back from [facility] who will refer the patient to a Telehealth Nurse to follow up for compliance. The patient does not have a chronic illness (COPD, CHF, DIABETES) that is usually followed by [facility]. He will be assessed for case management and SW needs post discharge.

## 2017-06-08 NOTE — Consult Note (Addendum)
   [facility] CM Inpatient Consult   06/08/2017  Andrew Carey Jul 22, 1950 [id number]    [facility] Care Management referral received.   Chart reviewed. Noted patient is at [facility] inpatient psychiatric hosptial. Spoke with [facility] Management Assistant Director about referral.   Telephone call to [facility] inpatient [place], [place]. Discussed referral. Referral not for DM but for transition of care for medication adherence and for follow up. Unable to speak with patient at this time. Currently on inpatient psychiatric unit. Will make referral to Telephonic RNCM for follow up. Patient to discharge tomorrow.    Marthenia Rolling, MSN-Ed, RN,BSN [facility] Liaison [phone]

## 2017-06-08 NOTE — [facility] Group Notes (Signed)
06/08/2017 9:30AM  Type of Therapy/Topic:  Group Therapy:  Balance in Life  Participation Level:  Active  Description of Group:   This group will address the concept of balance and how it feels and looks when one is unbalanced. Patients will be encouraged to process areas in their lives that are out of balance and identify reasons for remaining unbalanced. Facilitators will guide patients in utilizing problem-solving interventions to address and correct the stressor making their life unbalanced. Understanding and applying boundaries will be explored and addressed for obtaining and maintaining a balanced life. Patients will be encouraged to explore ways to assertively make their unbalanced needs known to significant others in their lives, using other group members and facilitator for support and feedback.  Therapeutic Goals: 1. Patient will identify two or more emotions or situations they have that consume much of in their lives. 2. Patient will identify signs/triggers that life has become out of balance:  3. Patient will identify two ways to set boundaries in order to achieve balance in their lives:  4. Patient will demonstrate ability to communicate their needs through discussion and/or role plays  Summary of Patient Progress: Actively and appropriately engaged in the group. Patient was able to provide support and validation to other group members.Patient practiced active listening when interacting with the facilitator and other group members Patient in still in the process of obtaining treatment goals. Adilson reports having many problems that have been building up on him that has been hard for him to process. He says "I tend to swell on those things and they build up."       Therapeutic Modalities:   Cognitive Behavioral Therapy Solution-Focused Therapy Assertiveness Training  Darin Engels, [place]

## 2017-06-08 NOTE — Progress Notes (Signed)
Patient is pleasant and cooperative with assessment  And care provided to him, states that his medication is right without any side effects, patient is socializing well with peers and staffs with out any issues, endorses for safety of self and others no signs of SI/HI or AVH noted, patient is sleeping long hours without any interruptions 15 minute  Check is done for safety no ditress noted.

## 2017-06-08 NOTE — Progress Notes (Addendum)
[facility] MD Progress Note  06/08/2017 2:34 PM Andrew Carey  MRN:  [id number]   Subjective:  Pt states that he is doing fairly well today. He states that his appetie is is much better than on admission. HE is doing really well in groups and very active in participation and has enjoyed them. HE feels like he is tolerating his medications well and is happy about this because he has not been able to tolerate medications in the past. He states that he does feel slightly jittery and "an electric volt" in the morning when he first wakes up but otherwise feels okay through the day. Discussed that it could be from some w/d symptoms from tapering off Xanax. We had long conversation about benzos and how they work making them difficult to come off of. 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 Pt is very future oriented and states that he feels very motivated to keep living and "make the most of my time left." HE still wants to volunteer at [employer]. He states that "It actually makes me sick even thinking about what I did." (about the suicide attempt). HE states, "I cant even believe I did that still." HE adamantly denies SI or any thoughts of self harm. HE continues to have great insight into himself and that he let things build up for too long and never had an outlet. He plans to change this. He did call his sister yesterday and they had an excellent conversation and he was really happy how it went.   I spoke with his sister again today to give her an update. She states that they had an excellent conversation last night and she is really happy with the progress that he has made. She plans to visit him today.   Principal Problem: Severe recurrent major depression without psychotic features ([place]) Diagnosis:   Patient Active Problem List   Diagnosis Date Noted  . Severe recurrent major depression without psychotic features ([place]) [F33.2] 06/02/2017    Priority: High  . Uncomplicated alcohol  dependence ([place]) [F10.20]   . Tobacco use disorder [F17.200]   . Benzodiazepine overdose [T42.4X1A] 06/02/2017  . Moderate recurrent major depression ([place]) [F33.1] 02/17/2017  . Adjustment disorder with mixed anxiety and depressed mood [F43.23] 02/17/2017  . Alcohol abuse [F10.10] 02/17/2017  . Elevated coronary artery calcium score [R93.1] 12/22/2016  . GAD (generalized anxiety disorder) [F41.1] 06/14/2016  . Chronic joint pain [M25.50, G89.29] 02/16/2016  . Pruritic condition [L29.9] 02/10/2016  . Panic disorder [F41.0] 02/10/2016  . Alcoholic cirrhosis of liver with ascites ([place]) [K70.31] 02/10/2016  . Hypotension due to drugs [I95.2] 01/19/2016   Total Time spent with patient:35 minutes  Past Psychiatric History: See H&P  Past Medical History:  Past Medical History:  Diagnosis Date  . Allergy   . Arthritis   . Cirrhosis ([place])   . Colon polyps   . Colorectal cancer ([place])   . Depression   . Diverticulosis   . Liver lesion   . Stroke [facility])     Past Surgical History:  Procedure Laterality Date  . COLON SURGERY    . HERNIA REPAIR     Family History:  Family History  Problem Relation Age of Onset  . Heart failure Father   . Congestive Heart Failure Father   . Heart disease Father   . Stroke Father   . Cancer Mother   . Colon polyps Mother   . Colon polyps Other   .  Depression Other   . Mental illness Other    Family Psychiatric  History: See H&P Social History:  Social History   Substance and Sexual Activity  Alcohol Use Yes   Comment: 6 pack daily.       Social History   Substance and Sexual Activity  Drug Use No    Social History   Socioeconomic History  . Marital status: Widowed    Spouse name: None  . Number of children: None  . Years of education: None  . Highest education level: None  Social Needs  . Financial resource strain: None  . Food insecurity - worry: None  . Food insecurity - inability: None  . Transportation needs - medical: None   . Transportation needs - non-medical: None  Occupational History  . Occupation: Retired  Tobacco Use  . Smoking status: Current Every Day Smoker    Packs/day: 1.00    Years: 50.00    Pack years: 50.00    Types: Cigarettes  . Smokeless tobacco: Never Used  Substance and Sexual Activity  . Alcohol use: Yes    Comment: 6 pack daily.    . Drug use: No  . Sexual activity: None  Other Topics Concern  . None  Social History Narrative  . None   Additional Social History:                         Sleep: Good  Appetite:  Good  Current Medications: Current Facility-Administered Medications  Medication Dose Route Frequency Provider Last Rate Last Dose  . acetaminophen (TYLENOL) tablet 650 mg  650 mg Oral Q6H PRN Clapacs, John T, MD      . albuterol (PROVENTIL HFA;VENTOLIN HFA) 108 (90 Base) MCG/ACT inhaler 2 puff  2 puff Inhalation Q4H PRN Clapacs, John T, MD      . alum & mag hydroxide-simeth (MAALOX/MYLANTA) 200-200-20 MG/5ML suspension 30 mL  30 mL Oral Q4H PRN Clapacs, John T, MD      . ARIPiprazole (ABILIFY) tablet 5 mg  5 mg Oral Daily Elanore Talcott, Tyson Babinski, MD   5 mg at 06/08/17 0824  . clonazePAM (KLONOPIN) tablet 0.25 mg  0.25 mg Oral TID Marylin Crosby, MD   0.25 mg at 06/08/17 1342  . desvenlafaxine (PRISTIQ) 24 hr tablet 50 mg  50 mg Oral Daily Viyaan Champine, Tyson Babinski, MD   50 mg at 06/08/17 0824  . folic acid (FOLVITE) tablet 1 mg  1 mg Oral Daily Chauncey Mann, MD   1 mg at 06/08/17 0824  . furosemide (LASIX) tablet 20 mg  20 mg Oral Daily Chauncey Mann, MD   20 mg at 06/08/17 0824  . gabapentin (NEURONTIN) tablet 600 mg  600 mg Oral TID Marylin Crosby, MD   600 mg at 06/08/17 1339  . lactulose (CHRONULAC) 10 GM/15ML solution 20 g  20 g Oral BID Clapacs, Madie Reno, MD   20 g at 06/08/17 3086  . levofloxacin (LEVAQUIN) tablet 750 mg  750 mg Oral Daily Clapacs, John T, MD   750 mg at 06/07/17 1717  . magnesium hydroxide (MILK OF MAGNESIA) suspension 30 mL  30 mL Oral Daily PRN  Clapacs, John T, MD      . nicotine (NICODERM CQ - dosed in mg/24 hours) patch 21 mg  21 mg Transdermal Daily Clapacs, Madie Reno, MD   21 mg at 06/08/17 0824  . oxyCODONE (Oxy IR/ROXICODONE) immediate release tablet 7.5 mg  7.5  mg Oral BID Clapacs, Madie Reno, MD   7.5 mg at 06/08/17 0823  . spironolactone (ALDACTONE) tablet 50 mg  50 mg Oral Daily Chauncey Mann, MD   50 mg at 06/08/17 0824  . thiamine (VITAMIN B-1) tablet 100 mg  100 mg Oral Daily Clapacs, Madie Reno, MD   100 mg at 06/08/17 0824  . traZODone (DESYREL) tablet 100 mg  100 mg Oral QHS PRN Clapacs, John T, MD      . vitamin B-12 (CYANOCOBALAMIN) tablet 1,000 mcg  1,000 mcg Oral Daily Chauncey Mann, MD   1,000 mcg at 06/08/17 0254    Lab Results: No results found for this or any previous visit (from the past 48 hour(s)).  Blood Alcohol level:  Lab Results  Component Value Date   ETH 74 (H) 06/02/2017   ETH 97 (H) 27/10/2374    Metabolic Disorder Labs: Lab Results  Component Value Date   HGBA1C 5.0 06/03/2017   MPG 96.8 06/03/2017   No results found for: PROLACTIN Lab Results  Component Value Date   CHOL 202 (H) 06/03/2017   TRIG 162 (H) 06/03/2017   HDL 36 (L) 06/03/2017   CHOLHDL 5.6 06/03/2017   VLDL 32 06/03/2017   LDLCALC 134 (H) 06/03/2017    Physical Findings: AIMS:  , ,  ,  ,    CIWA:  CIWA-Ar Total: 2 COWS:     Musculoskeletal: Strength & Muscle Tone: within normal limits Gait & Station: normal Patient leans: N/A  Psychiatric Specialty Exam: Physical Exam  Nursing note and vitals reviewed.   Review of Systems  All other systems reviewed and are negative.   Blood pressure 108/71, pulse 82, temperature 98.6 F (37 C), temperature source Oral, resp. rate 20, height 6' (1.829 m), weight 100.7 kg (222 lb), SpO2 96 %.Body mass index is 30.11 kg/m.  General Appearance: Casual  Eye Contact:  Good  Speech:  Clear and Coherent  Volume:  Normal  Mood:  Depressed but much brighter and feeling better   Affect:  Congruent  Thought Process:  Coherent and Goal Directed  Orientation:  Full (Time, Place, and Person)  Thought Content:  Logical  Suicidal Thoughts:  No  Homicidal Thoughts:  No  Memory:  Immediate;   Good  Judgement:  Good  Insight:  Good  Psychomotor Activity:  Normal  Concentration:  Concentration: Good  Recall:  Good  Fund of Knowledge:  Good  Language:  Good  Akathisia:  No      Assets:  Communication Skills Desire for Improvement Housing Resilience Social Support  ADL's:  Intact  Cognition:  WNL  Sleep:  Number of Hours: 5     Treatment Plan Summary: 67 yo male admitted after serious suicide attempt by overdosing on 70 tablets of Xanax. PT is making a lot of progress in his recovery and doing really well in group therapy. HE is also tolerating medications well. He is likely having some mild w/d symptoms from the Xanax taper which has been done fairly rapidly while in the hospital. WE had long discussion about this today and we plan to switch Xanax to longer acting Klonopin to make taper easier. HE is also on Gabapentin to help with this taper and to prevent any seizures. He will need to continue taper as an outpatient over the next month likely.   Plan:  MDD -Continue Pristiq 50 mg daily -Continue Abilify 5 mg daily  Recent Xanax overdose -Pt will be tapering off benzos which  was started during hospitalization. 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 HE has had over 500 days of sobriety prior to this relapse  Alcohol use disorder -HE will [facility] and AA  Cirrhosis -Continue Spironolactone  and Lasix  Dispo -He will return home on discharge and follow up with [facility]. His sister is very supportive and I have been in contact with her daily. He will be referred to [place] than 50% of face to face time with patient was spent on counseling and coordination of care. We discussed benzo taper and plan for this, Pristiq and Abilify mechanism of action, goal setting and plans for future, I also spoke with sister about goals and plans  Marylin Crosby, MD 06/08/2017, 2:34 PM

## 2017-06-08 NOTE — [facility] Group Notes (Signed)
[place] Group Notes:  (Nursing/MHT/Case Management/Adjunct)  Date:  06/08/2017  Time:  6:10 PM  Type of Therapy:  Psychoeducational Skills  Participation Level:  Active  Participation Quality:  Appropriate and Attentive  Affect:  Appropriate  Cognitive:  Appropriate  Insight:  Appropriate and Good  Engagement in Group:  Engaged  Modes of Intervention:  Discussion and Education  Summary of Progress/Problems:  Andrew Carey 06/08/2017, 6:10 PM

## 2017-06-08 NOTE — Plan of Care (Signed)
  Progressing Safety: Ability to remain free from injury will improve 06/08/2017 2034 - Progressing by Clemens Catholic, RN Education: Emotional status will improve 06/08/2017 2034 - Progressing by Clemens Catholic, RN Self-Concept: Ability to disclose and discuss suicidal ideas will improve 06/08/2017 2034 - Progressing by Clemens Catholic, RN [facility] Participation in Recreation Therapeutic Interventions STG-Other Recreation Therapy Goal (Specify) Description Patient will identify 3 positive ways of expressing themselves x5 days.  06/08/2017 2034 - Progressing by Clemens Catholic, RN

## 2017-06-08 NOTE — Plan of Care (Signed)
Pleasant and cooperative.  Denies SI/HI/AV.  Per self inventory rates depression as 2/10, anxiety as 5/10 and hopeless as 1/10.  Verbalizes feelings without any difficulty.  Verbalizes that has a more positive outlook on life.  Visible in the milieu. Interacting with peers and staff appropriately.  Medication and group compliant.  Support and encouragement offered.  Safety maintained on the unit.

## 2017-06-08 NOTE — [facility] Group Notes (Signed)
  06/08/2017  Time: 0900  Type of Therapy and Topic:  Group Therapy:  Setting Goals Participation Level:  Active  Description of Group: In this process group, patients discussed using strengths to work toward goals and address challenges.  Patients identified two positive things about themselves and one goal they were working on.  Patients were given the opportunity to share openly and support each other's plan for self-empowerment.  The group discussed the value of gratitude and were encouraged to have a daily reflection of positive characteristics or circumstances.  Patients were encouraged to identify a plan to utilize their strengths to work on current challenges and goals.  Therapeutic Goals 1. Patient will verbalize personal strengths/positive qualities and relate how these can assist with achieving desired personal goals 2. Patients will verbalize affirmation of peers plans for personal change and goal setting 3. Patients will explore the value of gratitude and positive focus as related to successful achievement of goals 4. Patients will verbalize a plan for regular reinforcement of personal positive qualities and circumstances.  Summary of Patient Progress: Pt continues to work towards their tx goals but has not yet reached them. Pt was able to appropriately participate in group discussion, and was able to offer support/validation to other group members. Pt reported his goal for the day is, "to attend all groups and write at least 1-2 letters by the end of the day."    Therapeutic Modalities Cognitive Behavioral Therapy Motivational Interviewing  Alden Hipp, MSW, LCSW 06/08/2017 9:39 AM

## 2017-06-09 MED ORDER — VITAMIN B-1 100 MG PO TABS
100.0000 mg | ORAL_TABLET | Freq: Every day | ORAL | Status: DC
  Administered 2017-06-12: 100 mg via ORAL
  Filled 2017-06-09: qty 1

## 2017-06-09 MED ORDER — CYANOCOBALAMIN 1000 MCG/ML IJ SOLN
1000.0000 ug | Freq: Once | INTRAMUSCULAR | Status: AC
  Administered 2017-06-11: 1000 ug via INTRAMUSCULAR
  Filled 2017-06-09: qty 1

## 2017-06-09 MED ORDER — GABAPENTIN 400 MG PO CAPS
400.0000 mg | ORAL_CAPSULE | Freq: Three times a day (TID) | ORAL | Status: DC
  Administered 2017-06-09 – 2017-06-12 (×9): 400 mg via ORAL
  Filled 2017-06-09 (×9): qty 1

## 2017-06-09 MED ORDER — CYANOCOBALAMIN 1000 MCG/ML IJ SOLN
1000.0000 ug | Freq: Once | INTRAMUSCULAR | Status: AC
  Administered 2017-06-10: 1000 ug via INTRAMUSCULAR
  Filled 2017-06-09: qty 1

## 2017-06-09 MED ORDER — GABAPENTIN 800 MG PO TABS
400.0000 mg | ORAL_TABLET | Freq: Three times a day (TID) | ORAL | Status: DC
  Filled 2017-06-09: qty 0.5

## 2017-06-09 MED ORDER — ARIPIPRAZOLE 2 MG PO TABS
2.0000 mg | ORAL_TABLET | Freq: Every day | ORAL | Status: DC
  Administered 2017-06-10 – 2017-06-12 (×3): 2 mg via ORAL
  Filled 2017-06-09 (×3): qty 1

## 2017-06-09 MED ORDER — ARIPIPRAZOLE 2 MG PO TABS: 2.0000 mg | ORAL_TABLET | Freq: Every day | ORAL | Status: DC

## 2017-06-09 MED ORDER — ARIPIPRAZOLE 5 MG PO TABS: 5.0000 mg | ORAL_TABLET | Freq: Every day | ORAL | Status: DC

## 2017-06-09 NOTE — [facility] Group Notes (Signed)
06/09/2017 9:30AM  Type of Therapy and Topic:  Group Therapy:  Feelings around Relapse and Recovery  Participation Level:  Active   Description of Group:    Patients in this group will discuss emotions they experience before and after a relapse. They will process how experiencing these feelings, or avoidance of experiencing them, relates to having a relapse. Facilitator will guide patients to explore emotions they have related to recovery. Patients will be encouraged to process which emotions are more powerful. They will be guided to discuss the emotional reaction significant others in their lives may have to patients' relapse or recovery. Patients will be assisted in exploring ways to respond to the emotions of others without this contributing to a relapse.  Therapeutic Goals: 1. Patient will identify two or more emotions that lead to a relapse for them 2. Patient will identify two emotions that result when they relapse 3. Patient will identify two emotions related to recovery 4. Patient will demonstrate ability to communicate their needs through discussion and/or role plays   Summary of Patient Progress: Actively and appropriately engaged in the group. Patient was able to provide support and validation to other group members.Patient practiced active listening when interacting with the facilitator and other group members Patient in still in the process of obtaining treatment goals. Andrew Carey  spoke about his relapse and ways that he can focus on recovery such as playing his guitar and planting.     Therapeutic Modalities:   Cognitive Behavioral Therapy Solution-Focused Therapy Assertiveness Training Relapse Prevention Therapy   Darin Engels, [place] 06/09/2017 10:53 AM

## 2017-06-09 NOTE — Progress Notes (Signed)
Recreation Therapy Notes  Date: 01.11.2019  Time: 1:00 PM  Location: Craft Room  Behavioral response: Appropriate  Intervention Topic: Leisure  Discussion/Intervention: Group content today was focused on leisure. The group defined what leisure is and some positive leisure activities they participate in. Individuals identified the difference between good and bad leisure. Participants expressed how they feel after participating in the leisure of their choice. The group discussed how they go about picking a leisure activity and if others are involved in their leisure activities. The patient stated how many leisure activities they too choose from and reasons why it is important to have leisure time. Individuals participated in the intervention "Leisure Jeopardy" where they had a chance to identify new leisure activities as well as benefits of leisure. Clinical Observations/Feedback:  Patient came to group and defined leisure as relaxation. He identified watching movies as a leisure activity he like to participate in. He was social with peers and staff while participating in the intervention during group.  Oshae Simmering LRT/CTRS         Kerrie Timm 06/09/2017 2:45 PM

## 2017-06-09 NOTE — Progress Notes (Signed)
Patient is resting comfortably in his room without any complain, reading a book, patient contact for safety of self and others and denies any suicidal thoughts or intents, patient states that his medications are just right without any side effect, and socializing adequately with peers without issues, patient is sleeping long hours and no distress 15 minute checks is in progress.

## 2017-06-09 NOTE — Progress Notes (Addendum)
[facility] MD Progress Note  06/09/2017 12:01 PM Andrew Carey  MRN:  [id number] Subjective:  Pt states that overall he is doing well. Mood is improving. He is still attending all groups and getting a lot out of it. He has spent a lot of time thinking about what he needs to do moving forward. He states, "I'm going to take thinks slow and not rush into doing to many things at once." He process through his history of addiction to alcohol and how he has used that as a coping skill for many years. He states, "I need to get a new arsenal of things to deal with things when they get hard." He plans to get back into AA. HE had a great visit with his sister last night. He is very future oriented. He denies SI or any thoughts of self harm and "still can't believe I did that." He is sleeping well. HE complains of feeling "some electrical volts when I first wake up."   Principal Problem: Severe recurrent major depression without psychotic features ([place]) Diagnosis:   Patient Active Problem List   Diagnosis Date Noted  . Severe recurrent major depression without psychotic features ([place]) [F33.2] 06/02/2017    Priority: High  . Uncomplicated alcohol dependence ([place]) [F10.20]   . Tobacco use disorder [F17.200]   . Benzodiazepine overdose [T42.4X1A] 06/02/2017  . Moderate recurrent major depression ([place]) [F33.1] 02/17/2017  . Adjustment disorder with mixed anxiety and depressed mood [F43.23] 02/17/2017  . Alcohol abuse [F10.10] 02/17/2017  . Elevated coronary artery calcium score [R93.1] 12/22/2016  . GAD (generalized anxiety disorder) [F41.1] 06/14/2016  . Chronic joint pain [M25.50, G89.29] 02/16/2016  . Pruritic condition [L29.9] 02/10/2016  . Panic disorder [F41.0] 02/10/2016  . Alcoholic cirrhosis of liver with ascites ([place]) [K70.31] 02/10/2016  . Hypotension due to drugs [I95.2] 01/19/2016   Total Time spent with patient: 35 minutes  Past Psychiatric History: See H&P  Past Medical History:  Past  Medical History:  Diagnosis Date  . Allergy   . Arthritis   . Cirrhosis ([place])   . Colon polyps   . Colorectal cancer ([place])   . Depression   . Diverticulosis   . Liver lesion   . Stroke [facility])     Past Surgical History:  Procedure Laterality Date  . COLON SURGERY    . HERNIA REPAIR     Family History:  Family History  Problem Relation Age of Onset  . Heart failure Father   . Congestive Heart Failure Father   . Heart disease Father   . Stroke Father   . Cancer Mother   . Colon polyps Mother   . Colon polyps Other   . Depression Other   . Mental illness Other    Family Psychiatric  History: See H&P Social History:  Social History   Substance and Sexual Activity  Alcohol Use Yes   Comment: 6 pack daily.       Social History   Substance and Sexual Activity  Drug Use No    Social History   Socioeconomic History  . Marital status: Widowed    Spouse name: None  . Number of children: None  . Years of education: None  . Highest education level: None  Social Needs  . Financial resource strain: None  . Food insecurity - worry: None  . Food insecurity - inability: None  . Transportation needs - medical: None  . Transportation needs - non-medical: None  Occupational History  . Occupation: Retired  Tobacco Use  . Smoking status: Current Every Day Smoker    Packs/day: 1.00    Years: 50.00    Pack years: 50.00    Types: Cigarettes  . Smokeless tobacco: Never Used  Substance and Sexual Activity  . Alcohol use: Yes    Comment: 6 pack daily.    . Drug use: No  . Sexual activity: None  Other Topics Concern  . None  Social History Narrative  . None   Additional Social History:                         Sleep: Good  Appetite:  Good  Current Medications: Current Facility-Administered Medications  Medication Dose Route Frequency Provider Last Rate Last Dose  . acetaminophen (TYLENOL) tablet 650 mg  650 mg Oral Q6H PRN Clapacs, John T, MD      .  albuterol (PROVENTIL HFA;VENTOLIN HFA) 108 (90 Base) MCG/ACT inhaler 2 puff  2 puff Inhalation Q4H PRN Clapacs, John T, MD      . alum & mag hydroxide-simeth (MAALOX/MYLANTA) 200-200-20 MG/5ML suspension 30 mL  30 mL Oral Q4H PRN Clapacs, Madie Reno, MD      . Derrill Memo ON 06/10/2017] ARIPiprazole (ABILIFY) tablet 2 mg  2 mg Oral Daily Hailei Besser R, MD      . clonazePAM (KLONOPIN) tablet 0.25 mg  0.25 mg Oral TID Marylin Crosby, MD   0.25 mg at 06/09/17 0823  . desvenlafaxine (PRISTIQ) 24 hr tablet 50 mg  50 mg Oral Daily Phillipa [place], Tyson Babinski, MD   50 mg at 06/09/17 0824  . folic acid (FOLVITE) tablet 1 mg  1 mg Oral Daily Chauncey Mann, MD   1 mg at 06/09/17 7510  . furosemide (LASIX) tablet 20 mg  20 mg Oral Daily Chauncey Mann, MD   20 mg at 06/09/17 0824  . gabapentin (NEURONTIN) tablet 600 mg  600 mg Oral TID Marylin Crosby, MD   600 mg at 06/09/17 0824  . lactulose (CHRONULAC) 10 GM/15ML solution 20 g  20 g Oral BID Clapacs, Madie Reno, MD   20 g at 06/09/17 2585  . magnesium hydroxide (MILK OF MAGNESIA) suspension 30 mL  30 mL Oral Daily PRN Clapacs, John T, MD      . nicotine (NICODERM CQ - dosed in mg/24 hours) patch 21 mg  21 mg Transdermal Daily Clapacs, Madie Reno, MD   21 mg at 06/09/17 0824  . oxyCODONE (Oxy IR/ROXICODONE) immediate release tablet 7.5 mg  7.5 mg Oral BID Clapacs, Madie Reno, MD   7.5 mg at 06/09/17 0824  . spironolactone (ALDACTONE) tablet 50 mg  50 mg Oral Daily Chauncey Mann, MD   50 mg at 06/09/17 0824  . thiamine (VITAMIN B-1) tablet 100 mg  100 mg Oral Daily Clapacs, Madie Reno, MD   100 mg at 06/09/17 0823  . traZODone (DESYREL) tablet 100 mg  100 mg Oral QHS PRN Clapacs, Madie Reno, MD   100 mg at 06/08/17 2137  . vitamin B-12 (CYANOCOBALAMIN) tablet 1,000 mcg  1,000 mcg Oral Daily Chauncey Mann, MD   1,000 mcg at 06/09/17 2778    Lab Results: No results found for this or any previous visit (from the past 48 hour(s)).  Blood Alcohol level:  Lab Results  Component Value Date   ETH 74  (H) 06/02/2017   ETH 97 (H) 24/23/5361    Metabolic Disorder Labs: Lab Results  Component Value  Date   HGBA1C 5.0 06/03/2017   MPG 96.8 06/03/2017   No results found for: PROLACTIN Lab Results  Component Value Date   CHOL 202 (H) 06/03/2017   TRIG 162 (H) 06/03/2017   HDL 36 (L) 06/03/2017   CHOLHDL 5.6 06/03/2017   VLDL 32 06/03/2017   LDLCALC 134 (H) 06/03/2017    Physical Findings: AIMS:  , ,  ,  ,    CIWA:  CIWA-Ar Total: 2 COWS:     Musculoskeletal: Strength & Muscle Tone: within normal limits Gait & Station: normal Patient leans: N/A  Psychiatric Specialty Exam: Physical Exam  Nursing note and vitals reviewed.   ROS  Blood pressure 104/77, pulse 79, temperature 98.6 F (37 C), temperature source Oral, resp. rate 20, height 6' (1.829 m), weight 100.7 kg (222 lb), SpO2 96 %.Body mass index is 30.11 kg/m.  General Appearance: Casual  Eye Contact:  Good  Speech:  Clear and Coherent  Volume:  Normal  Mood:  Euthymic  Affect:  Congruent  Thought Process:  Coherent and Goal Directed  Orientation:  Full (Time, Place, and Person)  Thought Content:  Logical  Suicidal Thoughts:  No  Homicidal Thoughts:  No  Memory:  Immediate;   Fair  Judgement:  Fair  Insight:  Fair  Psychomotor Activity:  Normal  Concentration:  Concentration: Fair  Recall:  [employer] of Knowledge:  Fair  Language:  Fair  Akathisia:  No      Assets:  Communication Skills Desire for Improvement Intimacy Resilience Social Support  ADL's:  Intact  Cognition:  WNL  Sleep:  Number of Hours: 6     Treatment Plan Summary: 67 yo male admitted after serious suicide attempt by overdosing on Xanax. Mood has been improving and has consistently been denying SI.   Plan:  MDD -Continue PRistiq 50 mg daily -Decrease Abilify to 2 mg daily. He is having some side effects but difficult to ascertain which medication may be causing it.   Recent Xanax overdose =He will be tapering off  benzos -Continue Klonopin 0.25 mg TID and this will be tapered off as an outpatient -Will decrease gabapentin to 400 mg TID to determine if this is causing some side effects  Alcohol use disorder -He will go to [facility] at [place]. And AA meetings  Dispo -HE will return home on discharge and follow up with [place] than 50% of face to face time with patient was spent on counseling and coordination of care. We discussed medications, addiction and mechanism of this, follow up plan  Marylin Crosby, MD 06/09/2017, 12:01 PM

## 2017-06-09 NOTE — Tx Team (Signed)
Interdisciplinary Treatment and Diagnostic Plan Update  06/09/2017 Time of Session: [place] MRN: [id number]  Principal Diagnosis: Severe recurrent major depression without psychotic features [facility])  Secondary Diagnoses: Principal Problem:   Severe recurrent major depression without psychotic features ([place]) Active Problems:   Uncomplicated alcohol dependence ([place])   Tobacco use disorder   Current Medications:  Current Facility-Administered Medications  Medication Dose Route Frequency Provider Last Rate Last Dose  . acetaminophen (TYLENOL) tablet 650 mg  650 mg Oral Q6H PRN Clapacs, John T, MD      . albuterol (PROVENTIL HFA;VENTOLIN HFA) 108 (90 Base) MCG/ACT inhaler 2 puff  2 puff Inhalation Q4H PRN Clapacs, John T, MD      . alum & mag hydroxide-simeth (MAALOX/MYLANTA) 200-200-20 MG/5ML suspension 30 mL  30 mL Oral Q4H PRN Clapacs, John T, MD      . ARIPiprazole (ABILIFY) tablet 5 mg  5 mg Oral Daily McNew, Tyson Babinski, MD   5 mg at 06/09/17 0824  . clonazePAM (KLONOPIN) tablet 0.25 mg  0.25 mg Oral TID Marylin Crosby, MD   0.25 mg at 06/09/17 0823  . desvenlafaxine (PRISTIQ) 24 hr tablet 50 mg  50 mg Oral Daily McNew, Tyson Babinski, MD   50 mg at 06/09/17 0824  . folic acid (FOLVITE) tablet 1 mg  1 mg Oral Daily Chauncey Mann, MD   1 mg at 06/09/17 4580  . furosemide (LASIX) tablet 20 mg  20 mg Oral Daily Chauncey Mann, MD   20 mg at 06/09/17 0824  . gabapentin (NEURONTIN) tablet 600 mg  600 mg Oral TID Marylin Crosby, MD   600 mg at 06/09/17 0824  . lactulose (CHRONULAC) 10 GM/15ML solution 20 g  20 g Oral BID Clapacs, Madie Reno, MD   20 g at 06/09/17 9983  . magnesium hydroxide (MILK OF MAGNESIA) suspension 30 mL  30 mL Oral Daily PRN Clapacs, John T, MD      . nicotine (NICODERM CQ - dosed in mg/24 hours) patch 21 mg  21 mg Transdermal Daily Clapacs, Madie Reno, MD   21 mg at 06/09/17 0824  . oxyCODONE (Oxy IR/ROXICODONE) immediate release tablet 7.5 mg  7.5 mg Oral BID Clapacs,  Madie Reno, MD   7.5 mg at 06/09/17 0824  . spironolactone (ALDACTONE) tablet 50 mg  50 mg Oral Daily Chauncey Mann, MD   50 mg at 06/09/17 0824  . thiamine (VITAMIN B-1) tablet 100 mg  100 mg Oral Daily Clapacs, Madie Reno, MD   100 mg at 06/09/17 0823  . traZODone (DESYREL) tablet 100 mg  100 mg Oral QHS PRN Clapacs, Madie Reno, MD   100 mg at 06/08/17 2137  . vitamin B-12 (CYANOCOBALAMIN) tablet 1,000 mcg  1,000 mcg Oral Daily Chauncey Mann, MD   1,000 mcg at 06/09/17 3825   PTA Medications: Medications Prior to Admission  Medication Sig Dispense Refill Last Dose  . ALPRAZolam (XANAX) 1 MG tablet take 1 tablet by mouth three times a day if needed for anxiety 90 tablet 5 06/02/2017 at Unknown time  . furosemide (LASIX) 80 MG tablet take 1 tablet by mouth once daily (Patient taking differently: take 40mg  by mouth once daily) 30 tablet 5 Past Week at Unknown time  . lactulose (CHRONULAC) 10 GM/15ML solution TAKE 30 MILLILITERS BY MOUTH THREE TIMES A DAY IF NEEDED 1800 mL 12 prn at prn  . mirtazapine (REMERON) 15 MG tablet Take 1 tablet (15 mg total) by  mouth at bedtime. (Patient not taking: Reported on 06/02/2017) 30 tablet 1 Not Taking at Unknown time  . oxyCODONE (ROXICODONE) 15 MG immediate release tablet Take 0.5 tablets (7.5 mg total) by mouth 2 (two) times daily. 30 tablet 0 prn at prn  . RA VITAMIN B-1 100 MG TABS take 1 tablet by mouth once daily 100 tablet 4 Past Week at Unknown time  . spironolactone (ALDACTONE) 100 MG tablet take 2 tablets by mouth once daily (Patient taking differently: take 1 tablets by mouth once daily) 60 tablet 12 Past Week at Unknown time    Patient Stressors: Financial difficulties Health problems Loss of Wife Marital or family conflict Substance abuse  Patient Strengths: Active sense of humor Average or above average intelligence Capable of independent living Communication skills Motivation for treatment/growth Religious Affiliation  Treatment Modalities:  Medication Management, Group therapy, Case management,  1 to 1 session with clinician, Psychoeducation, Recreational therapy.   Physician Treatment Plan for Primary Diagnosis: Severe recurrent major depression without psychotic features ([place]) Long Term Goal(s): Improvement in symptoms so as ready for discharge Improvement in symptoms so as ready for discharge   Short Term Goals: Ability to identify changes in lifestyle to reduce recurrence of condition will improve Ability to disclose and discuss suicidal ideas Ability to identify triggers associated with substance abuse/mental health issues will improve Ability to identify changes in lifestyle to reduce recurrence of condition will improve Ability to verbalize feelings will improve Ability to disclose and discuss suicidal ideas Compliance with prescribed medications will improve Ability to identify triggers associated with substance abuse/mental health issues will improve  Medication Management: Evaluate patient's response, side effects, and tolerance of medication regimen.  Therapeutic Interventions: 1 to 1 sessions, Unit Group sessions and Medication administration.  Evaluation of Outcomes: Progressing  Physician Treatment Plan for Secondary Diagnosis: Principal Problem:   Severe recurrent major depression without psychotic features ([place]) Active Problems:   Uncomplicated alcohol dependence ([place])   Tobacco use disorder  Long Term Goal(s): Improvement in symptoms so as ready for discharge Improvement in symptoms so as ready for discharge   Short Term Goals: Ability to identify changes in lifestyle to reduce recurrence of condition will improve Ability to disclose and discuss suicidal ideas Ability to identify triggers associated with substance abuse/mental health issues will improve Ability to identify changes in lifestyle to reduce recurrence of condition will improve Ability to verbalize feelings will improve Ability to  disclose and discuss suicidal ideas Compliance with prescribed medications will improve Ability to identify triggers associated with substance abuse/mental health issues will improve     Medication Management: Evaluate patient's response, side effects, and tolerance of medication regimen.  Therapeutic Interventions: 1 to 1 sessions, Unit Group sessions and Medication administration.  Evaluation of Outcomes: Progressing   RN Treatment Plan for Primary Diagnosis: Severe recurrent major depression without psychotic features ([place]) Long Term Goal(s): Knowledge of disease and therapeutic regimen to maintain health will improve  Short Term Goals: Ability to verbalize feelings will improve, Ability to identify and develop effective coping behaviors will improve and Compliance with prescribed medications will improve  Medication Management: RN will administer medications as ordered by provider, will assess and evaluate patient's response and provide education to patient for prescribed medication. RN will report any adverse and/or side effects to prescribing provider.  Therapeutic Interventions: 1 on 1 counseling sessions, Psychoeducation, Medication administration, Evaluate responses to treatment, Monitor vital signs and CBGs as ordered, Perform/monitor CIWA, COWS, AIMS and Fall Risk screenings as ordered,  Perform wound care treatments as ordered.  Evaluation of Outcomes: Progressing   LCSW Treatment Plan for Primary Diagnosis: Severe recurrent major depression without psychotic features ([place]) Long Term Goal(s): Safe transition to appropriate next level of care at discharge, Engage patient in therapeutic group addressing interpersonal concerns.  Short Term Goals: Engage patient in aftercare planning with referrals and resources, Identify triggers associated with mental health/substance abuse issues and Increase skills for wellness and recovery  Therapeutic Interventions: Assess for all discharge  needs, 1 to 1 time with Social worker, Explore available resources and support systems, Assess for adequacy in community support network, Educate family and significant other(s) on suicide prevention, Complete Psychosocial Assessment, Interpersonal group therapy.  Evaluation of Outcomes: Progressing   Progress in Treatment: Attending groups: Yes. Participating in groups: Yes. Taking medication as prescribed: Yes. Toleration medication: Yes. Family/Significant other contact made: Yes, completed with pt's sister.  Patient understands diagnosis: Yes. Discussing patient identified problems/goals with staff: Yes. Medical problems stabilized or resolved: Yes. Denies suicidal/homicidal ideation: Yes. Issues/concerns per patient self-inventory: No. Other: None   New problem(s) identified: No, Describe:  None at this time.   New Short Term/Long Term Goal(s): Pt reported his goal for treatment is to, "be different than when I came in. I want to go to [employer]. I want to keep getting counseling."   Discharge Plan or Barriers: Pt will discharge home and will continue treatment in the [facility] setting with [place].   Reason for Continuation of Hospitalization: Depression Medication stabilization  Estimated Length of Stay: 3 days   Recreational Therapy: Patient Stressors: Death Patient Goal: Patient will identify 3 positive ways of expressing themselves x5 days.   Attendees: Patient:  06/09/2017 11:30 AM  Physician: Dr. Wonda Olds, MD 06/09/2017 11:30 AM  Nursing: Elige Radon, RN 06/09/2017 11:30 AM  RN Care Manager: 06/09/2017 11:30 AM  Social Worker: Alden Hipp, LCSW 06/09/2017 11:30 AM  Recreational Therapist: Roanna Epley, CTRS-LRT 06/09/2017 11:30 AM  Other: Darin Engels, [place]  06/09/2017 11:30 AM  Other:  06/09/2017 11:30 AM  Other: 06/09/2017 11:30 AM    Scribe for Treatment Team: Alden Hipp, LCSW 06/09/2017 11:30 AM

## 2017-06-09 NOTE — Plan of Care (Signed)
Verbalizes that he still has some anxiety.  Verbalizes goal for today is to attend groups and continue to work on Radiographer, therapeutic.   Attends groups, up to dayroom for meals otherwise likes to stay in his room and read.  This is one of his coping mechanisms.  Support and encouragement offered.  Safety maintained on the unit.

## 2017-06-09 NOTE — Plan of Care (Signed)
Pt. Verbally contracts for safety. Pt. Denies SI/HI. Pt states he can remain safe while on the unit. Pt. Verbalizes understanding of provided education. Pt. Reports feeling, "better today" then he did yesterday. Pt. Attends groups and unit activities.

## 2017-06-10 NOTE — [facility] Group Notes (Signed)
LCSW Group Therapy Note  06/10/2017 1:15pm  Type of Therapy and Topic:  Group Therapy:  Cognitive Distortions  Participation Level:  Active   Description of Group:    Patients in this group will be introduced to the topic of cognitive distortions.  Patients will identify and describe cognitive distortions, describe the feelings these distortions create for them.  Patients will identify one or more situations in their personal life where they have cognitively distorted thinking and will verbalize challenging this cognitive distortion through positive thinking skills.  Patients will practice the skill of using positive affirmations to challenge cognitive distortions using affirmation cards.    Therapeutic Goals:  1. Patient will identify two or more cognitive distortions they have used 2. Patient will identify one or more emotions that stem from use of a cognitive distortion 3. Patient will demonstrate use of a positive affirmation to counter a cognitive distortion through discussion and/or role play. 4. Patient will describe one way cognitive distortions can be detrimental to wellness   Summary of Patient Progress: Pt actively participated in group discussion. Pt was able to identify cognitive distortions he has used in the past. Pt shared some of his unhelpful thinking styles that are affecting him the most such as personalization. Pt used positive affirmations to counter a cognitive distortion through discussion.       Therapeutic Modalities:   Cognitive Behavioral Therapy Motivational Interviewing   Ronie Fleeger  CUEBAS-COLON, LCSW 06/10/2017 11:25 AM

## 2017-06-10 NOTE — Progress Notes (Signed)
D:Pt denies SI/HI/AVH. Pt. Verbally contracts for safety. Pt is pleasant and cooperative. Pt states he can remain safe while on the unit. Pt. Reports feeling, "better today" then he did yesterday. Pt reports eating and sleeping, "good". Pt. Reports anxiety and depression this evening at a 6 and a 3 respectively.   A: Q x 15 minute observation checks were completed for safety. Patient was provided with education. Pt. Verbalizes understanding of provided education. Patient was given scheduled medications. Patient  was encourage to attend groups, participate in unit activities and continue with plan of care.   R:Patient is complaint with medication and unit procedures. Pt. Attends groups and unit activities.            Patient slept for Estimated Hours of 6.15; Precautionary checks every 15 minutes for safety maintained, room free of safety hazards, patient sustains no injury or falls during this shift.

## 2017-06-10 NOTE — Progress Notes (Signed)
D:Pt denies SI/HI/AVH. Pt. Verbally contracts for safety. Pt is pleasant and cooperative. Pt states he can remain safe while on the unit. Pt. Reports feeling, "better today" then he did yesterday. Pt reports eating and sleeping, "good". Pt. Reports anxiety and depression this evening at a 5 and a 2 respectively.   A: Q x 15 minute observation checks were completed for safety. Patient was provided with education. Pt. Verbalizes understanding of provided education. Patient was given scheduled medications. Patient was encourage to attend groups, participate in unit activities and continue with plan of care.   R:Patient is complaint with medication and unit procedures. Pt. Attends groups and unit activities.            Patient slept for Estimated Hours of 6.45; Precautionary checks every 15 minutes for safety maintained, room free of safety hazards, patient sustains no injury or falls during this shift.

## 2017-06-10 NOTE — Plan of Care (Signed)
Pt. Verbally contracts for safety. Pt. Denies SI/HI. Pt states he can remain safe while on the unit. Pt. Verbalizes understanding of provided education. Pt. Reports feeling, "better today" then he did yesterday. Pt. Attends groups and unit activities.

## 2017-06-10 NOTE — Progress Notes (Signed)
[facility] MD Progress Note  06/10/2017 9:10 PM Andrew Carey  MRN:  [id number] Subjective:   Pt states his body is adjusting to his medications and withdrawing from the old, so he feels a bit off but he thinks he will get through it, it just needs time.  Overall his mood is euthymic.  He denies si/hi.  He slept well.  He is social, walking around the unit.  Principal Problem: Severe recurrent major depression without psychotic features ([place]) Diagnosis:   Patient Active Problem List   Diagnosis Date Noted  . Uncomplicated alcohol dependence ([place]) [F10.20]   . Tobacco use disorder [F17.200]   . Severe recurrent major depression without psychotic features ([place]) [F33.2] 06/02/2017  . Benzodiazepine overdose [T42.4X1A] 06/02/2017  . Moderate recurrent major depression ([place]) [F33.1] 02/17/2017  . Adjustment disorder with mixed anxiety and depressed mood [F43.23] 02/17/2017  . Alcohol abuse [F10.10] 02/17/2017  . Elevated coronary artery calcium score [R93.1] 12/22/2016  . GAD (generalized anxiety disorder) [F41.1] 06/14/2016  . Chronic joint pain [M25.50, G89.29] 02/16/2016  . Pruritic condition [L29.9] 02/10/2016  . Panic disorder [F41.0] 02/10/2016  . Alcoholic cirrhosis of liver with ascites ([place]) [K70.31] 02/10/2016  . Hypotension due to drugs [I95.2] 01/19/2016   Total Time spent with patient: 20 minutes  Past Psychiatric History: See H&P  Past Medical History:  Past Medical History:  Diagnosis Date  . Allergy   . Arthritis   . Cirrhosis ([place])   . Colon polyps   . Colorectal cancer ([place])   . Depression   . Diverticulosis   . Liver lesion   . Stroke [facility])     Past Surgical History:  Procedure Laterality Date  . COLON SURGERY    . HERNIA REPAIR     Family History:  Family History  Problem Relation Age of Onset  . Heart failure Father   . Congestive Heart Failure Father   . Heart disease Father   . Stroke Father   . Cancer Mother   . Colon polyps Mother   .  Colon polyps Other   . Depression Other   . Mental illness Other    Family Psychiatric  History: See H&P Social History:  Social History   Substance and Sexual Activity  Alcohol Use Yes   Comment: 6 pack daily.       Social History   Substance and Sexual Activity  Drug Use No    Social History   Socioeconomic History  . Marital status: Widowed    Spouse name: None  . Number of children: None  . Years of education: None  . Highest education level: None  Social Needs  . Financial resource strain: None  . Food insecurity - worry: None  . Food insecurity - inability: None  . Transportation needs - medical: None  . Transportation needs - non-medical: None  Occupational History  . Occupation: Retired  Tobacco Use  . Smoking status: Current Every Day Smoker    Packs/day: 1.00    Years: 50.00    Pack years: 50.00    Types: Cigarettes  . Smokeless tobacco: Never Used  Substance and Sexual Activity  . Alcohol use: Yes    Comment: 6 pack daily.    . Drug use: No  . Sexual activity: None  Other Topics Concern  . None  Social History Narrative  . None   Additional Social History:  Sleep: Good  Appetite:  Good  Current Medications: Current Facility-Administered Medications  Medication Dose Route Frequency Provider Last Rate Last Dose  . acetaminophen (TYLENOL) tablet 650 mg  650 mg Oral Q6H PRN Clapacs, John T, MD      . albuterol (PROVENTIL HFA;VENTOLIN HFA) 108 (90 Base) MCG/ACT inhaler 2 puff  2 puff Inhalation Q4H PRN Clapacs, John T, MD      . alum & mag hydroxide-simeth (MAALOX/MYLANTA) 200-200-20 MG/5ML suspension 30 mL  30 mL Oral Q4H PRN Clapacs, John T, MD      . ARIPiprazole (ABILIFY) tablet 2 mg  2 mg Oral Daily McNew, Tyson Babinski, MD   2 mg at 06/10/17 0807  . clonazePAM (KLONOPIN) tablet 0.25 mg  0.25 mg Oral TID Marylin Crosby, MD   0.25 mg at 06/10/17 1228  . [START ON 06/11/2017] cyanocobalamin ((VITAMIN B-12)) injection  1,000 mcg  1,000 mcg Intramuscular Once McNew, Earnest Bailey R, MD      . desvenlafaxine (PRISTIQ) 24 hr tablet 50 mg  50 mg Oral Daily McNew, Tyson Babinski, MD   50 mg at 06/10/17 6948  . folic acid (FOLVITE) tablet 1 mg  1 mg Oral Daily Chauncey Mann, MD   1 mg at 06/10/17 0808  . furosemide (LASIX) tablet 20 mg  20 mg Oral Daily Chauncey Mann, MD   20 mg at 06/10/17 0809  . gabapentin (NEURONTIN) capsule 400 mg  400 mg Oral TID Marylin Crosby, MD   400 mg at 06/10/17 1701  . lactulose (CHRONULAC) 10 GM/15ML solution 20 g  20 g Oral BID Clapacs, John T, MD   20 g at 06/10/17 1701  . magnesium hydroxide (MILK OF MAGNESIA) suspension 30 mL  30 mL Oral Daily PRN Clapacs, John T, MD      . nicotine (NICODERM CQ - dosed in mg/24 hours) patch 21 mg  21 mg Transdermal Daily Clapacs, Madie Reno, MD   21 mg at 06/10/17 0809  . oxyCODONE (Oxy IR/ROXICODONE) immediate release tablet 7.5 mg  7.5 mg Oral BID Clapacs, John T, MD   7.5 mg at 06/10/17 1700  . spironolactone (ALDACTONE) tablet 50 mg  50 mg Oral Daily Chauncey Mann, MD   50 mg at 06/10/17 0809  . [START ON 06/12/2017] thiamine (VITAMIN B-1) tablet 100 mg  100 mg Oral Daily McNew, Tyson Babinski, MD      . traZODone (DESYREL) tablet 100 mg  100 mg Oral QHS PRN Clapacs, Madie Reno, MD   100 mg at 06/08/17 2137    Lab Results: No results found for this or any previous visit (from the past 48 hour(s)).  Blood Alcohol level:  Lab Results  Component Value Date   ETH 74 (H) 06/02/2017   ETH 97 (H) 54/62/7035    Metabolic Disorder Labs: Lab Results  Component Value Date   HGBA1C 5.0 06/03/2017   MPG 96.8 06/03/2017   No results found for: PROLACTIN Lab Results  Component Value Date   CHOL 202 (H) 06/03/2017   TRIG 162 (H) 06/03/2017   HDL 36 (L) 06/03/2017   CHOLHDL 5.6 06/03/2017   VLDL 32 06/03/2017   LDLCALC 134 (H) 06/03/2017    Physical Findings: AIMS: Facial and Oral Movements Muscles of Facial Expression: None, normal Lips and Perioral Area: None,  normal Jaw: None, normal Tongue: None, normal,Extremity Movements Upper (arms, wrists, hands, fingers): None, normal Lower (legs, knees, ankles, toes): None, normal, Trunk Movements Neck, shoulders, hips: None, normal, Overall  Severity Severity of abnormal movements (highest score from questions above): None, normal Incapacitation due to abnormal movements: None, normal Patient's awareness of abnormal movements (rate only patient's report): No Awareness, Dental Status Current problems with teeth and/or dentures?: No Does patient usually wear dentures?: No  CIWA:  CIWA-Ar Total: 2 COWS:     Musculoskeletal: Strength & Muscle Tone: within normal limits Gait & Station: normal Patient leans: N/A  Psychiatric Specialty Exam: Physical Exam  Nursing note and vitals reviewed.   ROS   Blood pressure 109/80, pulse 82, temperature 97.9 F (36.6 C), temperature source Oral, resp. rate 20, height 6' (1.829 m), weight 100.7 kg (222 lb), SpO2 96 %.Body mass index is 30.11 kg/m.  General Appearance: Casual  Eye Contact:  Good  Speech:  Clear and Coherent  Volume:  Normal  Mood:  Euthymic  Affect:  Congruent  Thought Process:  Coherent and Goal Directed  Orientation:  Full (Time, Place, and Person)  Thought Content:  Logical  Suicidal Thoughts:  No  Homicidal Thoughts:  No  Memory:  Immediate;   Fair  Judgement:  Fair  Insight:  Fair  Psychomotor Activity:  Normal  Concentration:  Concentration: Fair  Recall:  [employer] of Knowledge:  Fair  Language:  Fair  Akathisia:  No      Assets:  Communication Skills Desire for Improvement Intimacy Resilience Social Support  ADL's:  Intact  Cognition:  WNL  Sleep:  Number of Hours: 6     Treatment Plan Summary: 67 yo male admitted after serious suicide attempt by overdosing on Xanax. Mood has been improving and has consistently been denying SI.   Plan:  MDD -Continue PRistiq 50 mg daily -continue Abilify 2 mg daily.   Recent  Xanax overdose =He will be tapering off benzos -Continue Klonopin 0.25 mg TID and this will be tapered off as an outpatient -Will decrease gabapentin to 400 mg TID to determine if this is causing some side effects  Alcohol use disorder -He will go to SAIOP at [place]. And AA meetings  Dispo -HE will return home on discharge and follow up with [facility] than 50% of face to face time with patient was spent on counseling and coordination of care. We discussed medications, addiction and mechanism of this, follow up plan  Jolene Schimke, MD 06/10/2017, 9:10 PM

## 2017-06-11 NOTE — Plan of Care (Signed)
  Safety: Ability to remain free from injury will improve 06/11/2017 1021 - Progressing by Rise Mu, RN Note Remains safe on the unit without any injury 06/11/2017 1016 - Progressing by Rise Mu, RN Note Remains safe and free of injury on the unnit   Education: Knowledge of [facility] Education information/materials will improve 06/11/2017 1021 - Completed/Met by Rise Mu, RN   Self-Concept: Ability to disclose and discuss suicidal ideas will improve 06/11/2017 1021 - Progressing by Rise Mu, RN Note Denies any thoughts of SI or self harm 06/11/2017 1016 - Progressing by Rise Mu, RN Note Denies SI at this time   Self-Concept: Ability to verbalize positive feelings about self will improve 06/11/2017 1021 - Progressing by Rise Mu, RN 06/11/2017 1016 - Not Progressing by Rise Mu, RN Note Unable to verbalize anything positive about himself.  Only states "I'm tired"   Spiritual Needs Ability to function at adequate level 06/11/2017 1021 - Progressing by Rise Mu, RN Note Maintains personal care chores appropriately, attends groups and up to dayroom for meals 06/11/2017 1016 - Progressing by Rise Mu, RN Note Maintains personal care chores appropriately

## 2017-06-11 NOTE — BHH Group Notes (Signed)
[facility] Group Notes:  (Nursing/MHT/Case Management/Adjunct)  Date:  06/11/2017  Time:  5:52 AM  Type of Therapy:  Psychoeducational Skills  Participation Level:  Active  Participation Quality:  Appropriate, Attentive and Sharing  Affect:  Appropriate  Cognitive:  Appropriate  Insight:  Appropriate and Good  Engagement in Group:  Engaged  Modes of Intervention:  Discussion, Socialization and Support  Summary of Progress/Problems:  Reece Agar 06/11/2017, 5:52 AM

## 2017-06-11 NOTE — Progress Notes (Signed)
[facility] MD Progress Note  06/11/2017 8:40 PM Andrew Carey  MRN:  [id number] Subjective:   Seen laying in bed this morning.  States he sleep was disrupted, he woke up in the middle of the night so he is resting now.  States his depression is 3/10.  Thinking about going home, how he can help his mom and brother. "Eating those pills was the most selfish thing I've ever done."  He is thinking about what he wants to be about when he goes home. No si/hi.  Principal Problem: Severe recurrent major depression without psychotic features ([place]) Diagnosis:   Patient Active Problem List   Diagnosis Date Noted  . Uncomplicated alcohol dependence ([place]) [F10.20]   . Tobacco use disorder [F17.200]   . Severe recurrent major depression without psychotic features ([place]) [F33.2] 06/02/2017  . Benzodiazepine overdose [T42.4X1A] 06/02/2017  . Moderate recurrent major depression ([place]) [F33.1] 02/17/2017  . Adjustment disorder with mixed anxiety and depressed mood [F43.23] 02/17/2017  . Alcohol abuse [F10.10] 02/17/2017  . Elevated coronary artery calcium score [R93.1] 12/22/2016  . GAD (generalized anxiety disorder) [F41.1] 06/14/2016  . Chronic joint pain [M25.50, G89.29] 02/16/2016  . Pruritic condition [L29.9] 02/10/2016  . Panic disorder [F41.0] 02/10/2016  . Alcoholic cirrhosis of liver with ascites ([place]) [K70.31] 02/10/2016  . Hypotension due to drugs [I95.2] 01/19/2016   Total Time spent with patient: 20 minutes  Past Psychiatric History: See H&P  Past Medical History:  Past Medical History:  Diagnosis Date  . Allergy   . Arthritis   . Cirrhosis ([place])   . Colon polyps   . Colorectal cancer ([place])   . Depression   . Diverticulosis   . Liver lesion   . Stroke [facility])     Past Surgical History:  Procedure Laterality Date  . COLON SURGERY    . HERNIA REPAIR     Family History:  Family History  Problem Relation Age of Onset  . Heart failure Father   . Congestive Heart Failure Father    . Heart disease Father   . Stroke Father   . Cancer Mother   . Colon polyps Mother   . Colon polyps Other   . Depression Other   . Mental illness Other    Family Psychiatric  History: See H&P Social History:  Social History   Substance and Sexual Activity  Alcohol Use Yes   Comment: 6 pack daily.       Social History   Substance and Sexual Activity  Drug Use No    Social History   Socioeconomic History  . Marital status: Widowed    Spouse name: None  . Number of children: None  . Years of education: None  . Highest education level: None  Social Needs  . Financial resource strain: None  . Food insecurity - worry: None  . Food insecurity - inability: None  . Transportation needs - medical: None  . Transportation needs - non-medical: None  Occupational History  . Occupation: Retired  Tobacco Use  . Smoking status: Current Every Day Smoker    Packs/day: 1.00    Years: 50.00    Pack years: 50.00    Types: Cigarettes  . Smokeless tobacco: Never Used  Substance and Sexual Activity  . Alcohol use: Yes    Comment: 6 pack daily.    . Drug use: No  . Sexual activity: None  Other Topics Concern  . None  Social History Narrative  . None   Additional Social  History:                         Sleep: Good  Appetite:  Good  Current Medications: Current Facility-Administered Medications  Medication Dose Route Frequency Provider Last Rate Last Dose  . acetaminophen (TYLENOL) tablet 650 mg  650 mg Oral Q6H PRN Clapacs, John T, MD      . albuterol (PROVENTIL HFA;VENTOLIN HFA) 108 (90 Base) MCG/ACT inhaler 2 puff  2 puff Inhalation Q4H PRN Clapacs, John T, MD      . alum & mag hydroxide-simeth (MAALOX/MYLANTA) 200-200-20 MG/5ML suspension 30 mL  30 mL Oral Q4H PRN Clapacs, John T, MD      . ARIPiprazole (ABILIFY) tablet 2 mg  2 mg Oral Daily McNew, Tyson Babinski, MD   2 mg at 06/11/17 2725  . clonazePAM (KLONOPIN) tablet 0.25 mg  0.25 mg Oral TID Marylin Crosby, MD    0.25 mg at 06/11/17 1218  . desvenlafaxine (PRISTIQ) 24 hr tablet 50 mg  50 mg Oral Daily McNew, Tyson Babinski, MD   50 mg at 06/11/17 3664  . folic acid (FOLVITE) tablet 1 mg  1 mg Oral Daily Chauncey Mann, MD   1 mg at 06/11/17 4034  . furosemide (LASIX) tablet 20 mg  20 mg Oral Daily Chauncey Mann, MD   20 mg at 06/11/17 7425  . gabapentin (NEURONTIN) capsule 400 mg  400 mg Oral TID Marylin Crosby, MD   400 mg at 06/11/17 1727  . lactulose (CHRONULAC) 10 GM/15ML solution 20 g  20 g Oral BID Clapacs, Madie Reno, MD   20 g at 06/11/17 1727  . magnesium hydroxide (MILK OF MAGNESIA) suspension 30 mL  30 mL Oral Daily PRN Clapacs, John T, MD      . nicotine (NICODERM CQ - dosed in mg/24 hours) patch 21 mg  21 mg Transdermal Daily Clapacs, Madie Reno, MD   21 mg at 06/11/17 0845  . oxyCODONE (Oxy IR/ROXICODONE) immediate release tablet 7.5 mg  7.5 mg Oral BID Clapacs, John T, MD   7.5 mg at 06/11/17 1727  . spironolactone (ALDACTONE) tablet 50 mg  50 mg Oral Daily Chauncey Mann, MD   50 mg at 06/11/17 0838  . [START ON 06/12/2017] thiamine (VITAMIN B-1) tablet 100 mg  100 mg Oral Daily McNew, Tyson Babinski, MD      . traZODone (DESYREL) tablet 100 mg  100 mg Oral QHS PRN Clapacs, Madie Reno, MD   100 mg at 06/08/17 2137    Lab Results: No results found for this or any previous visit (from the past 48 hour(s)).  Blood Alcohol level:  Lab Results  Component Value Date   ETH 74 (H) 06/02/2017   ETH 97 (H) 95/63/8756    Metabolic Disorder Labs: Lab Results  Component Value Date   HGBA1C 5.0 06/03/2017   MPG 96.8 06/03/2017   No results found for: PROLACTIN Lab Results  Component Value Date   CHOL 202 (H) 06/03/2017   TRIG 162 (H) 06/03/2017   HDL 36 (L) 06/03/2017   CHOLHDL 5.6 06/03/2017   VLDL 32 06/03/2017   LDLCALC 134 (H) 06/03/2017    Physical Findings: AIMS: Facial and Oral Movements Muscles of Facial Expression: None, normal Lips and Perioral Area: None, normal Jaw: None, normal Tongue: None,  normal,Extremity Movements Upper (arms, wrists, hands, fingers): None, normal Lower (legs, knees, ankles, toes): None, normal, Trunk Movements Neck, shoulders, hips: None, normal,  Overall Severity Severity of abnormal movements (highest score from questions above): None, normal Incapacitation due to abnormal movements: None, normal Patient's awareness of abnormal movements (rate only patient's report): No Awareness, Dental Status Current problems with teeth and/or dentures?: No Does patient usually wear dentures?: No  CIWA:  CIWA-Ar Total: 2 COWS:     Musculoskeletal: Strength & Muscle Tone: within normal limits Gait & Station: normal Patient leans: N/A  Psychiatric Specialty Exam: Physical Exam  Nursing note and vitals reviewed.   ROS   Blood pressure 127/68, pulse 71, temperature 99 F (37.2 C), temperature source Oral, resp. rate 18, height 6' (1.829 m), weight 100.7 kg (222 lb), SpO2 96 %.Body mass index is 30.11 kg/m.  General Appearance: Casual  Eye Contact:  Good  Speech:  Clear and Coherent  Volume:  Normal  Mood:  Dysphoric  Affect:  Congruent  Thought Process:  Coherent and Goal Directed  Orientation:  Full (Time, Place, and Person)  Thought Content:  Logical  Suicidal Thoughts:  No  Homicidal Thoughts:  No  Memory:  Immediate;   Fair  Judgement:  Fair  Insight:  Fair  Psychomotor Activity:  Normal  Concentration:  Concentration: Fair  Recall:  [employer] of Knowledge:  Fair  Language:  Fair  Akathisia:  No      Assets:  Communication Skills Desire for Improvement Intimacy Resilience Social Support  ADL's:  Intact  Cognition:  WNL  Sleep:  Number of Hours: 6     Treatment Plan Summary: 67 yo male admitted after serious suicide attempt by overdosing on Xanax. Mood has been improving and has consistently been denying SI.   Plan:  MDD -Continue PRistiq 50 mg daily -continue Abilify 2 mg daily.   Recent Xanax overdose =He will be tapering off  benzos -Continue Klonopin 0.25 mg TID and this will be tapered off as an outpatient -Will decrease gabapentin to 400 mg TID to determine if this is causing some side effects  Alcohol use disorder -He will go to SAIOP at [place]. And AA meetings  Dispo -HE will return home on discharge and follow up with [facility] than 50% of face to face time with patient was spent on counseling and coordination of care. We discussed medications, addiction and mechanism of this, follow up plan  Jolene Schimke, MD 06/11/2017, 8:40 PM

## 2017-06-11 NOTE — Progress Notes (Signed)
D:Pt denies SI/HI/AVH. Pt. Verbally contracts for safety.Pt is pleasant and cooperative. Pt states he can remain safe while on the unit. Pt. Reports feeling, "better today" then he did yesterday.Pt reports eating and sleeping, "good". Pt. Reports anxiety and depression this evening at a 6 and a 2 respectively.Pt. Feels the depression is, "going away, but it's causing anxiety to elevate I feel". Will update treatement team during morning handoff report.    A: Q x 15 minute observation checks were completed for safety. Patient was provided with education.Pt. Verbalizes understanding of provided education.Patient was given scheduled medications. Patient was encourage to attend groups, participate in unit activities and continue with plan of care.   R:Patient is complaint with medication and unit procedures. Pt. Attends groups and unit activities.            Patient slept for Estimated Hours of5..45; Precautionary checks every 15 minutes for safety maintained, room free of safety hazards, patient sustains no injury or falls during this shift.

## 2017-06-11 NOTE — BHH Group Notes (Signed)
LCSW Group Therapy Note 06/11/2017 1:15pm  Type of Therapy and Topic: Group Therapy: Feelings Around Returning Home & Establishing a Supportive Framework and Supporting Oneself When Supports Not Available  Participation Level: Active  Description of Group:  Patients first processed thoughts and feelings about upcoming discharge. These included fears of upcoming changes, lack of change, new living environments, judgements and expectations from others and overall stigma of mental health issues. The group then discussed the definition of a supportive framework, what that looks and feels like, and how do to discern it from an unhealthy non-supportive network. The group identified different types of supports as well as what to do when your family/friends are less than helpful or unavailable  Therapeutic Goals  1. Patient will identify one healthy supportive network that they can use at discharge. 2. Patient will identify one factor of a supportive framework and how to tell it from an unhealthy network. 3. Patient able to identify one coping skill to use when they do not have positive supports from others. 4. Patient will demonstrate ability to communicate their needs through discussion and/or role plays.  Summary of Patient Progress:  Pt engaged during group session. As patients processed their anxiety about discharge and described healthy supports patient shared he feels good about being discharged. Patients identified at least one self-care tool they were willing to use after discharge.   Therapeutic Modalities Cognitive Behavioral Therapy Motivational Interviewing   Cheree Ditto, LCSW 06/11/2017 12:29 PM

## 2017-06-11 NOTE — Plan of Care (Deleted)
  Safety: Ability to remain free from injury will improve 06/11/2017 1016 - Progressing by Rise Mu, RN Note Remains safe and free of injury on the unnit   Education: Emotional status will improve 06/11/2017 1016 - Not Progressing by Rise Mu, RN Note Affect flat.  Continues to endorse depression.  Rates depression, anxiety and hopelessness as 10/10.   Self-Concept: Ability to disclose and discuss suicidal ideas will improve 06/11/2017 1016 - Progressing by Rise Mu, RN Note Denies SI at this time   Self-Concept: Ability to verbalize positive feelings about self will improve 06/11/2017 1016 - Not Progressing by Rise Mu, RN Note Unable to verbalize anything positive about himself.  Only states "I'm tired"   Spiritual Needs Ability to function at adequate level 06/11/2017 1016 - Progressing by Rise Mu, RN Note Maintains personal care chores appropriately

## 2017-06-11 NOTE — Plan of Care (Signed)
Pt. Verbally contracts for safety. Pt. Denies SI/HI. Pt states he can remain safe while on the unit. Pt. Verbalizes understanding of provided education. Pt. Reports feeling, "better today" then he did yesterday. Pt. Attends groups and unit activities.

## 2017-06-12 MED ORDER — TRAZODONE HCL 100 MG PO TABS: 100.0000 mg | ORAL_TABLET | Freq: Every evening | ORAL | 0 refills | Status: DC | PRN

## 2017-06-12 MED ORDER — ARIPIPRAZOLE 2 MG PO TABS: 2.0000 mg | ORAL_TABLET | Freq: Every day | ORAL | 0 refills | Status: DC

## 2017-06-12 MED ORDER — CLONAZEPAM 0.5 MG PO TABS: 0.2500 mg | ORAL_TABLET | ORAL | 0 refills | Status: DC

## 2017-06-12 MED ORDER — FUROSEMIDE 80 MG PO TABS: ORAL_TABLET | ORAL | 5 refills | Status: DC

## 2017-06-12 MED ORDER — GABAPENTIN 400 MG PO CAPS: 400.0000 mg | ORAL_CAPSULE | Freq: Three times a day (TID) | ORAL | 0 refills | Status: DC

## 2017-06-12 MED ORDER — DESVENLAFAXINE SUCCINATE ER 50 MG PO TB24: 50.0000 mg | ORAL_TABLET | Freq: Every day | ORAL | 0 refills | Status: DC

## 2017-06-12 MED ORDER — SPIRONOLACTONE 100 MG PO TABS: ORAL_TABLET | ORAL | 12 refills | Status: DC

## 2017-06-12 NOTE — BHH Group Notes (Signed)
[facility] Group Notes:  (Nursing/MHT/Case Management/Adjunct)  Date:  06/12/2017  Time:  4:28 AM  Type of Therapy:  Group Therapy  Participation Level:  Active  Participation Quality:  Appropriate  Affect:  Appropriate  Cognitive:  Appropriate  Insight:  Appropriate  Engagement in Group:  Engaged  Modes of Intervention:  Discussion  Summary of Progress/Problems:  Andrew Carey 06/12/2017, 4:28 AM

## 2017-06-12 NOTE — Progress Notes (Signed)
  [facility] Adult Case Management Discharge Plan :  Will you be returning to the same living situation after discharge:  Yes,  with brother & mother.  At discharge, do you have transportation home?: Yes,  pt's brother, Jaquail Mclees. Do you have the ability to pay for your medications: Yes,  insurance.   Release of information consent forms completed and in the chart;  Patient's signature needed at discharge.  Patient to Follow up at: Follow-up [facility]. Go on 06/20/2017.   Why:  Please go to your hospital follow up for SAIOP on Tuesday, 06/20/17 at 11:00AM. Thank you!  Contact information: [place] [id number] [phone]           Next level of care provider has access to [facility] and Suicide Prevention discussed: Yes,  with pt and his sister.   Have you used any form of tobacco in the last 30 days? (Cigarettes, Smokeless Tobacco, Cigars, and/or Pipes): Yes  Has patient been referred to the Quitline?: Patient refused referral  Patient has been referred for addiction treatment: Yes, SAIOP with [facility].   Alden Hipp, LCSW 06/12/2017, 8:56 AM

## 2017-06-12 NOTE — Progress Notes (Signed)
Patient denies SI/HI, denies A/V hallucinations. Patient verbalizes understanding of discharge instructions, follow up care and prescriptions. Patient given all belongings from  locker. Patient escorted out by staff, transported by family. 

## 2017-06-12 NOTE — BHH Suicide Risk Assessment (Signed)
[facility] Discharge Suicide Risk Assessment   Principal Problem: Severe recurrent major depression without psychotic features [facility]) Discharge Diagnoses:  Patient Active Problem List   Diagnosis Date Noted  . Severe recurrent major depression without psychotic features ([place]) [F33.2] 06/02/2017    Priority: High  . Uncomplicated alcohol dependence ([place]) [F10.20]   . Tobacco use disorder [F17.200]   . Benzodiazepine overdose [T42.4X1A] 06/02/2017  . Moderate recurrent major depression ([place]) [F33.1] 02/17/2017  . Adjustment disorder with mixed anxiety and depressed mood [F43.23] 02/17/2017  . Alcohol abuse [F10.10] 02/17/2017  . Elevated coronary artery calcium score [R93.1] 12/22/2016  . GAD (generalized anxiety disorder) [F41.1] 06/14/2016  . Chronic joint pain [M25.50, G89.29] 02/16/2016  . Pruritic condition [L29.9] 02/10/2016  . Panic disorder [F41.0] 02/10/2016  . Alcoholic cirrhosis of liver with ascites ([place]) [K70.31] 02/10/2016  . Hypotension due to drugs [I95.2] 01/19/2016      Mental Status Per Nursing Assessment::   On Admission:     Demographic Factors:  Male, Caucasian and Living alone  Loss Factors: Decline in physical health  Historical Factors: NA  Risk Reduction Factors:   Positive social support, Positive therapeutic relationship and Positive coping skills or problem solving skills  Continued Clinical Symptoms:  anxiety  Cognitive Features That Contribute To Risk:  None    Suicide Risk:  Minimal: No identifiable suicidal ideation.  Patients presenting with no risk factors but with morbid ruminations; may be classified as minimal risk based on the severity of the depressive symptoms  Follow-up [place]. Go on 06/20/2017.   Why:  Please go to your hospital follow up for SAIOP on Tuesday, 06/20/17 at 11:00AM. Thank you!  Contact information: [place] [address] [phone]           Plan Of Care/Follow-up  recommendations: Follow up with [place], MD 06/12/2017, 9:28 AM

## 2017-06-12 NOTE — BHH Group Notes (Signed)
06/12/2017  Time: 0930   Type of Therapy and Topic:  Group Therapy:  Overcoming Obstacles   Participation Level:  Active   Description of Group:   In this group patients will be encouraged to explore what they see as obstacles to their own wellness and recovery. They will be guided to discuss their thoughts, feelings, and behaviors related to these obstacles. The group will process together ways to cope with barriers, with attention given to specific choices patients can make. Each patient will be challenged to identify changes they are motivated to make in order to overcome their obstacles. This group will be process-oriented, with patients participating in exploration of their own experiences, giving and receiving support, and processing challenge from other group members.   Therapeutic Goals: 1. Patient will identify personal and current obstacles as they relate to admission. 2. Patient will identify barriers that currently interfere with their wellness or overcoming obstacles.  3. Patient will identify feelings, thought process and behaviors related to these barriers. 4. Patient will identify two changes they are willing to make to overcome these obstacles:      Summary of Patient Progress  Pt continues to work towards their tx goals but has not yet reached them. Pt was able to appropriately participate in group discussion, and was able to offer support/validation to other group members. Pt reported his long term obstacle is, "maintaining my health, and not letting my kids remember me as someone that was buzzed all the time." pt reported his short term obstacle is, "my addiction. I have broken it up into a series of short term obstacles so it's more achievable." Pt reported a step he is willing to take in order to overcome his obstacles is, "going to [facility]."    Therapeutic Modalities:   Cognitive Behavioral Therapy Solution Focused Therapy Motivational Interviewing Relapse Prevention  Therapy  Alden Hipp, MSW, LCSW 06/12/2017 10:34 AM

## 2017-06-12 NOTE — Progress Notes (Signed)
Recreation Therapy Notes  INPATIENT RECREATION TR PLAN  Patient Details Name: Andrew Carey MRN: [id number] DOB: July 12, 1950 Today's Date: 06/12/2017  Rec Therapy Plan Is patient appropriate for Therapeutic Recreation?: Yes Treatment times per week: at least 3 Estimated Length of Stay: 5-7 days TR Treatment/Interventions: Group participation (Comment)  Discharge Criteria Pt will be discharged from therapy if:: Discharged Treatment plan/goals/alternatives discussed and agreed upon by:: Patient/family  Discharge Summary Short term goals set: Patient will identify 3 positive ways of expressing themselves x5 days.  Short term goals met: Complete Progress toward goals comments: Groups attended Which groups?: Stress management, Leisure education(Problem Solving,Team work, Loss adjuster, chartered eexpression) Reason goals not met: N/A Therapeutic equipment acquired: N/A Reason patient discharged from therapy: Discharge from hospital Pt/family agrees with progress & goals achieved: Yes Date patient discharged from therapy: 06/12/17   Marius Betts 06/12/2017, 3:07 PM
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
## 2017-06-13 ENCOUNTER — Other Ambulatory Visit: Payer: Self-pay | Admitting: *Deleted

## 2017-06-13 ENCOUNTER — Encounter: Payer: Self-pay | Admitting: *Deleted

## 2017-06-13 NOTE — Patient Outreach (Signed)
06/13/17  Davidmichael Zarazua 07-Oct-1950 MRN [id number]  Transition of care call  Unsuccessful telephone encounter to Treavor Blomquist, 67 year old male for follow up on referral received 06/09/17 for [place] services- transition of care/ recent hospitalization  January 5-14,2019 for severe recurrent major depression without psychotic features, Uncomplicated alcohol dependence, Hypotension due to drugs.  Original referral from [place] hospital liaison to [place] CM, changed by [place] Plan director to go to [facility].  Pt's history includes but not limited to Cirrhosis of liver with ascites,GAD, Elevated coronary artery calcium score, tobacco use disorder, chronic joint pain.   Unable to leave a voice message on pt's home phone as phone kept ringing.  Unable to leave a voice message on pt's mobile number- message person not available.    Plan: RN CM to make another attempt later today.    [name].   [facility] Care Management  [phone]

## 2017-06-13 NOTE — Patient Outreach (Signed)
Transition of care call  06/13/17  Successful telephone encounter to Andrew Carey, 67 year old male- follow up on referral  received 06/09/17 for [place] services for transition of care.  Recent hospitalization  January 5-14,2019 for severe recurrent major depression without psychotic features,  Uncomplicated alcohol dependence, hypotension due to drugs.   Pt's history includes but not  Limited to Cirrhosis of liver with ascites, GAD, elevated coronary calcium score, tobacco use Disorder, chronic joint pain.   Spoke with pt, HIPAA identifiers verified, discussed purpose of  Call- follow up on referral for recent hospitalization.  Discussed with pt [facility] services, benefit of His insurance/MD, no cost to him- follow for Transition of care which includes weekly calls for 31 days to which pt reports would benefit from that /verbal consent given.    Pt reports settling back in, taking it easy,gradually getting life in order, still struggles with alcohol And pills, need to stay away from alcohol, has appointments to follow up with psychiatrist, [facility] 1/22 and PCP 5/14.  Pt reports he is ready to do whatever it takes, even  Go to Alcoholics anonymous if needed.   Pt reports lives with 9 year old Mom and handicap Brother, has 5 brothers and sisters, have transportation issues now, in the process of buying  A truck from his sister.  RN CM discussed with pt  [facility] LCSW was referred, can follow up on  His transportation needs.   Pt reports he has a grief counselor through Hospice that he sees  At least once a month - wife died almost 3 years ago.   Medications:  Pt reports he is taking all of his medications, was started on a new anti depressant medication (Klonopin) which is helping his  depression/energy better but noticed now more anxious and nervous,sleep disrupted.   Pt reports MD told him he would have side effects For a few weeks withdrawing from Xanax/ starting on new medications,  can call MD if  Needed.  ** Patient was recently discharged from hospital and all medications have been reviewed. Review of pt's medications shows pt does not have Gabapentin to which review of discharge  Papers informed pt started for anxiety.   Pt reports he did not receive a prescription for that, plans To call MD.    Plan:  As discussed with pt, plan to follow up again next week telephonically- part of ongoing    Transition of care.            Provided pt with RN CM's contact information as well as 24 hour nurse line to call if     Needed.     Barrier letter sent to Dr. Caryn Carey informing of  [facility] involvement.   [name].   [facility] Care Management  [phone]

## 2017-06-15 ENCOUNTER — Telehealth: Payer: Self-pay | Admitting: Psychiatry

## 2017-06-15 NOTE — Telephone Encounter (Signed)
Pt called unit stating that he did not receive prescription for Gabapentin on discharge. He did pick up his other medications. Unclear where this prescription is as it was printed. I called in prescription to [facility] for Gabapentin 400 mg TID #90 with 0 refills. Also discussed with him about his appointment with [facility] on 06/20/17. He states that overall he is doing well.

## 2017-06-16 ENCOUNTER — Other Ambulatory Visit: Payer: Self-pay | Admitting: *Deleted

## 2017-06-16 NOTE — Patient Outreach (Signed)
[facility] [place]) Care Management  06/16/2017  Andrew Carey 1950/06/11 [id number]   Patient referred to this social worker for follow up following his behavioral health inpatient stay from 06/03/17-06/12/17. There ws no answer. This Education officer, museum will make another attempt to reach patient within 1 week.  Sheralyn Boatman [facility] Care Management [phone]

## 2017-06-19 ENCOUNTER — Other Ambulatory Visit: Payer: Self-pay | Admitting: Family Medicine

## 2017-06-20 ENCOUNTER — Other Ambulatory Visit: Payer: Self-pay | Admitting: *Deleted

## 2017-06-20 NOTE — Patient Outreach (Signed)
06/20/17   Andrew Carey  1950-09-06 [id number]  Transition of care call:  Unsuccessful telephone encounter to [name], 67 year old male for ongoing transition of care/recent Behavioral inpatient Stay from January 5-14,2019 for severe recurrent major depression Without psychotic features, uncomplicated alcohol dependence,  Hypotension due to drugs.   Pt's history includes but not limited to  Cirrhosis of liver with ascites, GAD, elevated calcium score, tobacco  Use disorder, chronic joint pain.  Unable to leave a voice message on  Pt's home phone (kept ringing) and mobile phone (message person  Not available at this time).      Plan:  RN CM will make another attempt within the next 3 days.    [name].   [place] [facility]  [phone]

## 2017-06-21 ENCOUNTER — Other Ambulatory Visit: Payer: Self-pay | Admitting: *Deleted

## 2017-06-21 NOTE — Patient Outreach (Signed)
[facility]) Care Management  06/21/2017  Andrew Carey 04/12/1951 [id number]  Follow up phone call to patient following his recent behavioral health stay.  Patient was very pleasant over the phone,stating that overall he is doing well. Patient resides with his mother and brother and describes a positive support system. Patient  explained that he has struggled with depression for several years, which had heightened with his divorce, strained relationship with his children and the death of his second wife approximately 3 years ago. Patient described a long history of trying different medications which have not been successful. Patient's  xanax now replaced with low dose of klonopin. He reports also starting on prestiq. Per patient, he continues to experience some anxiety and agitation as he adjust to his new medications. Patient reminded of coping skills learned in in-patient.(playing his guitar, art, taking short walks, deep breathing). Patient also states that he has followed up with [facility] Follow up phone call Patient states that he is doing well, [facility] yesterday , initial intake completed. He is scheduled to see the Psychiatrist, Dr. Wonda Amis on 06/29/17.  Patient also connected with Hospice who came by today to check on him. Patient denies any thoughts of self harm or thoughts of harming others. Patient reports having no access to weapons in the home.    Plan: This Education officer, museum will follow up with patient in 2 weeks to ensure continued follow up with community mental health.   Sheralyn Boatman [facility] Management [phone]

## 2017-06-21 NOTE — Patient Outreach (Signed)
06/20/17   Jabree Rebert  03-26-1951 [id number]  Successful telephone encounter to Mikle Bosworth, 67 year old male for ongoing transition of care/recent  Behavioral Health inpatient stay January 5-14,2019 for severe recurrent major depression without  Psychotic features, uncomplicated alcohol dependence, hypotension due to drugs.  Pt's history includes But not limited to Cirrhosis of liver with ascites, GAD, elevated calcium score, tobacco use disorder, Chronic joint pain.   Spoke with pt, HIPAA identifiers verified.   Pt reports went to [facility]- saw intake person- filled out forms, saw therapist, set up an appointment to see  Psychiatrist 1/31, informed has resources for transportation to medical appointments/food banks.   Pt reports taking all of his medications, followed up with Dr. Billee Cashing New about Gabapentin, medication Called in, taking as ordered- helping some,a little anxious.  Pt reports depression has lifted, little  Increase in anxiety.  Pt reports to follow up with Dr. Doy Hutching PCP in 2-3 weeks.  Pt reports SW and NP  Came today  from Hospice (receiving grief support) told  stepping out, felt following up at [facility] was better for him now, to follow up one more time next month.  RN CM discussed with  Pt doing a home visit- part of transition of care to which pt agreed.   Plan:  As discussed with pt, plan to follow up again next week- initial home visit.   [name].   [place] [facility]  [phone]

## 2017-06-27 ENCOUNTER — Ambulatory Visit: Payer: Self-pay | Admitting: *Deleted

## 2017-06-30 ENCOUNTER — Other Ambulatory Visit: Payer: Self-pay | Admitting: *Deleted

## 2017-06-30 ENCOUNTER — Encounter: Payer: Self-pay | Admitting: *Deleted

## 2017-06-30 NOTE — Patient Outreach (Signed)
Andrew Carey   06/30/2017  Andrew Carey Dec 05, 1950 [id number]  Andrew Carey is an 67 y.o. male  Subjective:  Pt reports followed up at [facility] yesterday, saw Dr. Rosine Door who  [name] over his medications,prescribed Buspar for his anxiety.   Pt reports MD to schedule him to follow up with a therapist, open to group therapy.   Pt reports doing better, not where he wants to be but not where he was.  Pt reports weakness getting better, can't  Stand long, wants to get back to walking.     Objective:   Vitals:   06/30/17 1241 06/30/17 1343  BP: 100/64 98/74  Pulse: 81   Resp: (!) 28   SpO2: 96%     ROS  Physical Exam  Constitutional: He is oriented to person, place, and time. He appears well-developed and well-nourished.  Cardiovascular: Normal rate, regular rhythm and normal heart sounds.  Respiratory: Effort normal and breath sounds normal.  GI: Soft. Bowel sounds are normal.  Musculoskeletal: Normal range of motion. He exhibits no edema.  Neurological: He is alert and oriented to person, place, and time.  Skin: Skin is warm and dry.  Psychiatric: He has a normal mood and affect. His behavior is normal. Judgment and thought content normal.    Encounter Medications:   Outpatient Encounter Medications as of 06/30/2017  Medication Sig Note  . ARIPiprazole (ABILIFY) 2 MG tablet Take 1 tablet (2 mg total) by mouth daily.   . clonazePAM (KLONOPIN) 0.5 MG tablet Take 0.5 tablets (0.25 mg total) by mouth See admin instructions. Take 0.25 mg twice a day for 14 days, then decrease to 0.25 mg daily for 14 days then stop   . desvenlafaxine (PRISTIQ) 50 MG 24 hr tablet Take 1 tablet (50 mg total) by mouth daily.   . furosemide (LASIX) 80 MG tablet take 40mg  by mouth once daily 06/13/2017: Pt taking as needed.   . gabapentin (NEURONTIN) 400 MG capsule Take 1 capsule (400 mg total) by mouth 3 (three) times daily. (Patient not taking: Reported  on 06/13/2017)   . lactulose (CHRONULAC) 10 GM/15ML solution TAKE 30 MILLILITERS BY MOUTH THREE TIMES A DAY IF NEEDED   . oxyCODONE (ROXICODONE) 15 MG immediate release tablet take 1/2 tablets by mouth twice a day   . RA VITAMIN B-1 100 MG TABS take 1 tablet by mouth once daily   . spironolactone (ALDACTONE) 100 MG tablet take 1 tablets by mouth once daily   . traZODone (DESYREL) 100 MG tablet Take 1 tablet (100 mg total) by mouth at bedtime as needed for sleep. (Patient not taking: Reported on 06/13/2017)    No facility-administered encounter medications on file as of 06/30/2017.     Functional Status:   In your present state of health, do you have any difficulty performing the following activities: 06/30/2017 06/02/2017  Hearing? N N  Vision? N N  Difficulty concentrating or making decisions? N N  Walking or climbing stairs? N N  Dressing or bathing? N N  Doing errands, shopping? N -  Preparing Food and eating ? N -  Using the Toilet? N -  In the past six months, have you accidently leaked urine? N -  Do you have problems with loss of bowel control? Y -  Comment occassional  -  Managing your Medications? N -  Managing your Finances? N -  Housekeeping or managing your Housekeeping? N -  Some encounter information is confidential and  restricted. Go to Review Flowsheets activity to see all data.  Some recent data might be hidden    Fall/Depression Screening:    PHQ 2/9 Scores 06/30/2017 10/11/2016  PHQ - 2 Score 1 2  PHQ- 9 Score - 6    Assessment:  Pleasant 67 year old gentleman, lives with Mom and brother.   RN CM following pt for Transition of care/recent Behavioral Health inpatient stay from January 5-14,2019 for severe  Recurrent major depression without psychotic features, uncomplicated alcohol dependence,  Hypotension due to drugs.  Pt's history includes but not limited to Cirrhosis of liver with ascites, GAD, elevated calcium score,tobacco use disorder, chronic joint pain.       Chronic pain- lower back, joints, per pt +6 today    BP-  RA 100/64  sitting, 98/74 standing- no observed or complaints of dizziness.    Depression: per pt compliant with all medications, following up at [facility].   Plan: As discussed with pt, plan to continue to follow for transition of care, follow up again  Next week telephonically.            Plan to send Dr. [name] home visit encounter.    THN CM Care Plan Problem One     Most Recent Value  Care Plan Problem One  Risk for readmission related to recent hospitalization for Severe recurrent major depression without psychotic features, uncomplicated alcohol dependence, Hypotension due to drugs   Role Documenting the Problem One  Care [facility] [place] for Problem One  Active  THN Long Term Goal   Pt would not readmit to the hospital within the next 31 days   THN Long Term Goal Start Date  06/13/17  Interventions for Problem One Long Term Goal  home visit/assessment done.   THN CM Short Term Goal #1   Pt would take all of his medications as ordered for the next 30 days   THN CM Short Term Goal #1 Start Date  06/13/17  Interventions for Short Term Goal #1  Medications reviewed- profile updated.   THN CM Short Term Goal #2   Pt would keep all MD appointments in the next 30 days   THN CM Short Term Goal #2 Start Date  06/13/17  Interventions for Short Term Goal #2  Discussed with pt recent visit with Psychiatrist.      [name].   [place] [facility]  [phone]

## 2017-07-04 ENCOUNTER — Other Ambulatory Visit: Payer: Self-pay | Admitting: Family Medicine

## 2017-07-04 MED ORDER — FUROSEMIDE 80 MG PO TABS: ORAL_TABLET | ORAL | 5 refills | Status: DC

## 2017-07-04 NOTE — Telephone Encounter (Signed)
Patient needs Furosemide 80 mg. Sent into [facility] (which is now [facility] at [address])

## 2017-07-05 ENCOUNTER — Ambulatory Visit: Payer: Self-pay | Admitting: *Deleted

## 2017-07-05 ENCOUNTER — Other Ambulatory Visit: Payer: Self-pay | Admitting: *Deleted

## 2017-07-05 NOTE — Patient Outreach (Signed)
07/05/2017  Transition of care call:  Unable to contact to pt's home phone and leave a voice message.   Successful telephone encounter to Andrew Carey, 67 year old male on his mobile phone for transition of care/ongoing  Follow up on recent [facility] inpatient stay January 5-14,2019 for severe recurrent major depression without  Psychotic features, uncomplicated alcohol dependence, hypotension due to drugs.  Pt's history includes but not  Limited to Cirrhosis of liver with ascites, GAD, elevated calcium score, tobacco use disorder, chronic joint pain.   Spoke with pt,HIPAA identifiers verified.   Pt reports doing well, up and down with appetite and sleep, nothing to be  Alarmed about.  Pt reports scheduled to see Therapist 07/07/17, taking all his medications, do not see a chang yet  With Buspar, informed pt MD could take up to 2-4 weeks to be effective.  Pt reports went out with his sister  Yesterday for 2 hours, tolerated well.  Pt reports no dizziness, staying hydrated, BP ranges 90- 100 over 60-70,  Careful getting up slowly.    Plan:  As discussed with pt, plan to follow up again next week telephonically (part of ongoing transition of care).  [name].   [place] [facility]  [phone]

## 2017-07-06 ENCOUNTER — Other Ambulatory Visit: Payer: Self-pay | Admitting: *Deleted

## 2017-07-06 ENCOUNTER — Other Ambulatory Visit: Payer: Self-pay | Admitting: Family Medicine

## 2017-07-06 MED ORDER — FUROSEMIDE 80 MG PO TABS: ORAL_TABLET | ORAL | 5 refills | Status: DC

## 2017-07-06 NOTE — Telephone Encounter (Signed)
Resent rx

## 2017-07-06 NOTE — Telephone Encounter (Signed)
Pt stated that [facility] ([facility] [employer] sent [place] a refill request for furosemide (LASIX) 80 MG tablet on 07/04/17 and was calling to check the status. It appears the medication was approved but the Rx status was set to No Print. Can this Rx be sent electronically to [place]? Please advise. Thanks TNP

## 2017-07-06 NOTE — Patient Outreach (Signed)
[facility]) Care Management  07/06/2017  Andrew Carey [birth year] [id number]   Follow up phone call to patient following his recent behavioral health stay to ensure continued follow up with community mental health. Patient states that he did follow up with the Psychiatrist at the [facility] on 06/29/17 and has an appointment with his therapist on 07/07/17. Per patient, the agency will provide the transportation there. Patient denies thoughts of self harm, however states that he has had a down day today. Patient focused on his estranged relationship with his adult children following his divorce. Patient states that he has tried to use his coping strategies but is not confident that they will work. This Education officer, museum encouraged continued use of coping strategies and active participation in therapy. Patient discussed looking forward to his appointment, hoping for a "break through". Patient verbalized no community resource needs at this time. Continued participation in outpatient mental health treatment encouraged.   Plan: This Education officer, museum to close patient to [place] work as patient is actively participating in outpatient treatment and has verbalized having no additional social work needs.    Sheralyn Boatman [facility] Care Management [phone]

## 2017-07-13 ENCOUNTER — Encounter: Payer: Self-pay | Admitting: Emergency Medicine

## 2017-07-13 ENCOUNTER — Telehealth: Payer: Self-pay | Admitting: *Deleted

## 2017-07-13 ENCOUNTER — Other Ambulatory Visit: Payer: Self-pay | Admitting: *Deleted

## 2017-07-13 ENCOUNTER — Other Ambulatory Visit: Payer: Self-pay

## 2017-07-13 ENCOUNTER — Emergency Department
Admission: EM | Admit: 2017-07-13 | Discharge: 2017-07-13 | Disposition: A | Discharge status: Home / Self Care | Payer: Medicare Other | Attending: Emergency Medicine | Admitting: Emergency Medicine

## 2017-07-13 DIAGNOSIS — Z79899 Other long term (current) drug therapy: Secondary | ICD-10-CM | POA: Diagnosis not present

## 2017-07-13 DIAGNOSIS — R52 Pain, unspecified: Secondary | ICD-10-CM | POA: Insufficient documentation

## 2017-07-13 DIAGNOSIS — F17210 Nicotine dependence, cigarettes, uncomplicated: Secondary | ICD-10-CM | POA: Insufficient documentation

## 2017-07-13 LAB — CBC WITH DIFFERENTIAL/PLATELET
Basophils Absolute: 0.1 10*3/uL (ref 0–0.1)
Basophils Relative: 1 %
EOS ABS: 0.1 10*3/uL (ref 0–0.7)
EOS PCT: 2 %
HCT: 42.5 % (ref 40.0–52.0)
Hemoglobin: 15 g/dL (ref 13.0–18.0)
LYMPHS ABS: 2.4 10*3/uL (ref 1.0–3.6)
LYMPHS PCT: 34 %
MCH: 31.8 pg (ref 26.0–34.0)
MCHC: 35.3 g/dL (ref 32.0–36.0)
MCV: 90.1 fL (ref 80.0–100.0)
MONO ABS: 0.7 10*3/uL (ref 0.2–1.0)
MONOS PCT: 10 %
Neutro Abs: 3.8 10*3/uL (ref 1.4–6.5)
Neutrophils Relative %: 53 %
PLATELETS: 147 10*3/uL — AB (ref 150–440)
RBC: 4.71 MIL/uL (ref 4.40–5.90)
RDW: 13.6 % (ref 11.5–14.5)
WBC: 7.1 10*3/uL (ref 3.8–10.6)

## 2017-07-13 LAB — COMPREHENSIVE METABOLIC PANEL
ALT: 20 U/L (ref 17–63)
ANION GAP: 9 (ref 5–15)
AST: 19 U/L (ref 15–41)
Albumin: 3.3 g/dL — ABNORMAL LOW (ref 3.5–5.0)
Alkaline Phosphatase: 74 U/L (ref 38–126)
BUN: 13 mg/dL (ref 6–20)
CHLORIDE: 109 mmol/L (ref 101–111)
CO2: 27 mmol/L (ref 22–32)
CREATININE: 0.8 mg/dL (ref 0.61–1.24)
Calcium: 9.4 mg/dL (ref 8.9–10.3)
Glucose, Bld: 107 mg/dL — ABNORMAL HIGH (ref 65–99)
POTASSIUM: 3.7 mmol/L (ref 3.5–5.1)
SODIUM: 145 mmol/L (ref 135–145)
Total Bilirubin: 0.6 mg/dL (ref 0.3–1.2)
Total Protein: 6.6 g/dL (ref 6.5–8.1)

## 2017-07-13 MED ORDER — ACETAMINOPHEN 325 MG PO TABS
650.0000 mg | ORAL_TABLET | Freq: Once | ORAL | Status: AC
  Filled 2017-07-13: qty 2

## 2017-07-13 NOTE — Telephone Encounter (Addendum)
Patient called office complaining of having severe pain all over his body. Patient also stated he is nauseous and lightheaded. Patient went to ER at [facility] this morning but left without being treated after about 3 hours. Patient wanted provider's opinion on what he should do concerning symptoms. Patient is concerned his symptoms are related to cirrhosis of the liver. Per Dr. Rosanna Randy pt was advised to go back to the ER. Patient stated he did not want to go to [facility], so Dr. Elnora Morrison advised him to go to [facility]. When advised pt stated that he doesn't want to go anywhere, he will just stay home "till the end". Patient requested a medication to help with pain. Patient stated he wants a prescribed medication instead of street drugs. Offered pt an appt with Dr. [name] for tomorrow, which he also declined. Patient stated that he knows how to deal with his pain and if he is still here tomorrow and in pain he will call for an appt.

## 2017-07-13 NOTE — ED Provider Notes (Signed)
[facility] Emergency Department Provider Note    ____________________________________________   I have reviewed the triage vital signs and the nursing notes.   HISTORY  Chief Complaint Generalized Body Aches   History limited by: Not Limited   HPI Andrew Carey is a 67 y.o. male who presents to the emergency department today because of pain. He states it is throughout his body, from his head to his toes. It started about 2 hours ago. It woke him from sleep. He denies similar pain in the past. When he went to bed last night he was feeling fine. No recent illness. No fevers.    Per medical record review patient has a history of depression, alcohol abuse.   Past Medical History:  Diagnosis Date  . Allergy   . Arthritis   . Cirrhosis ([place])   . Colon polyps   . Colorectal cancer ([place])   . Depression   . Diverticulosis   . Liver lesion     Patient Active Problem List   Diagnosis Date Noted  . Uncomplicated alcohol dependence ([place])   . Tobacco use disorder   . Severe recurrent major depression without psychotic features ([place]) 06/02/2017  . Benzodiazepine overdose 06/02/2017  . Moderate recurrent major depression ([place]) 02/17/2017  . Adjustment disorder with mixed anxiety and depressed mood 02/17/2017  . Alcohol abuse 02/17/2017  . Elevated coronary artery calcium score 12/22/2016  . GAD (generalized anxiety disorder) 06/14/2016  . Chronic joint pain 02/16/2016  . Pruritic condition 02/10/2016  . Panic disorder 02/10/2016  . Alcoholic cirrhosis of liver with ascites ([place]) 02/10/2016  . Hypotension due to drugs 01/19/2016    Past Surgical History:  Procedure Laterality Date  . COLON SURGERY    . HERNIA REPAIR      Prior to Admission medications   Medication Sig Start Date End Date Taking? Authorizing Provider  ARIPiprazole (ABILIFY) 2 MG tablet Take 1 tablet (2 mg total) by mouth daily. 06/12/17   McNew, Tyson Babinski, MD  busPIRone  (BUSPAR) 5 MG tablet Take 5 mg by mouth 3 (three) times daily.    [provider]  clonazePAM (KLONOPIN) 0.5 MG tablet Take 0.5 tablets (0.25 mg total) by mouth See admin instructions. Take 0.25 mg twice a day for 14 days, then decrease to 0.25 mg daily for 14 days then stop Patient not taking: Reported on 06/30/2017 06/12/17   Marylin Crosby, MD  desvenlafaxine (PRISTIQ) 50 MG 24 hr tablet Take 1 tablet (50 mg total) by mouth daily. 06/12/17   McNew, Tyson Babinski, MD  furosemide (LASIX) 80 MG tablet take 40mg  by mouth once daily 07/06/17   Birdie Sons, MD  gabapentin (NEURONTIN) 400 MG capsule Take 1 capsule (400 mg total) by mouth 3 (three) times daily. 06/12/17   McNew, Tyson Babinski, MD  lactulose (CHRONULAC) 10 GM/15ML solution TAKE 30 MILLILITERS BY MOUTH THREE TIMES A DAY IF NEEDED 01/31/17   Birdie Sons, MD  oxyCODONE (ROXICODONE) 15 MG immediate release tablet take 1/2 tablets by mouth twice a day 06/19/17   Birdie Sons, MD  RA VITAMIN B-1 100 MG TABS take 1 tablet by mouth once daily 03/05/17   Birdie Sons, MD  spironolactone (ALDACTONE) 100 MG tablet take 1 tablets by mouth once daily 06/12/17   McNew, Tyson Babinski, MD  traZODone (DESYREL) 100 MG tablet Take 1 tablet (100 mg total) by mouth at bedtime as needed for sleep. Patient not taking: Reported on 06/13/2017 06/12/17  McNew, Tyson Babinski, MD    Allergies Fluoxetine and Codeine  Family History  Problem Relation Age of Onset  . Heart failure Father   . Congestive Heart Failure Father   . Heart disease Father   . Stroke Father   . Mental illness Father   . Cancer Mother   . Colon polyps Mother   . Colon polyps Sister   . Depression Sister   . Mental illness Sister   . Mental illness Sister   . Depression Sister   . Mental illness Brother   . Depression Brother   . Mental illness Sister   . Depression Sister     Social History Social History   Tobacco Use  . Smoking status: Current Every Day Smoker    Packs/day: 1.00     Years: 50.00    Pack years: 50.00    Types: Cigarettes  . Smokeless tobacco: Never Used  Substance Use Topics  . Alcohol use: No    Frequency: Never    Comment: 6 pack daily.    . Drug use: No    Review of Systems Constitutional: No fever/chills Eyes: No visual changes. ENT: No sore throat. Cardiovascular: Denies chest pain. Respiratory: Denies shortness of breath. Gastrointestinal: No abdominal pain.  No nausea, no vomiting.  No diarrhea.   Genitourinary: Negative for dysuria. Musculoskeletal: Positive for generalized pain. Skin: Negative for rash. Neurological: Negative for headaches, focal weakness or numbness.  ____________________________________________   PHYSICAL EXAM:  VITAL SIGNS: ED Triage Vitals [07/13/17 0536]  Enc Vitals Group     BP 120/88     Pulse Rate 91     Resp 18     Temp 97.6 F (36.4 C)     Temp Source Oral     SpO2 95 %     Weight 220 lb (99.8 kg)     Height 6' (1.829 m)     Head Circumference      Peak Flow      Pain Score 9   Constitutional: Alert and oriented. Well appearing and in no distress. Eyes: Conjunctivae are normal.  ENT   Head: Normocephalic and atraumatic.   Nose: No congestion/rhinnorhea.   Mouth/Throat: Mucous membranes are moist.   Neck: No stridor. Hematological/Lymphatic/Immunilogical: No cervical lymphadenopathy. Cardiovascular: Normal rate, regular rhythm.  No murmurs, rubs, or gallops.  Respiratory: Normal respiratory effort without tachypnea nor retractions. Breath sounds are clear and equal bilaterally. No wheezes/rales/rhonchi. Gastrointestinal: Soft and non tender. No rebound. No guarding.  Genitourinary: Deferred Musculoskeletal: Normal range of motion in all extremities. No lower extremity edema. Neurologic:  Normal speech and language. No gross focal neurologic deficits are appreciated.  Skin:  Skin is warm, dry and intact. No rash noted. Psychiatric: Mood and affect are normal. Speech and  behavior are normal. Patient exhibits appropriate insight and judgment.  ____________________________________________    LABS (pertinent positives/negatives)  CBC wnl except plt 147 CMP wnl except glu 107, albu 3.3  ____________________________________________   EKG  None  ____________________________________________    RADIOLOGY  None  ____________________________________________   PROCEDURES  Procedures  ____________________________________________   INITIAL IMPRESSION / ASSESSMENT AND PLAN / ED COURSE  Pertinent labs & imaging results that were available during my care of the patient were reviewed by me and considered in my medical decision making (see chart for details).  Patient presented to the emergency department today because of concerns for generalized pain.  Otherwise no other symptoms.  On exam patient appears well.  I did  send off basic blood work which came back without any concerning findings.  However prior to being able discussed the results with the patient he left the emergency department.   ____________________________________________   FINAL CLINICAL IMPRESSION(S) / ED DIAGNOSES  Final diagnoses:  Generalized pain     Note: This dictation was prepared with Dragon dictation. Any transcriptional errors that result from this process are unintentional     Nance Pear, MD 07/13/17 [phone]

## 2017-07-13 NOTE — ED Notes (Signed)
Came to room to give pt some Tylenol. Pt stated "you guys run a tight ship, Im just gonna leave and go get some street drugs, some street Fentanyl." I convinced him to at least sign out AMA then pt left.

## 2017-07-13 NOTE — Patient Outreach (Signed)
Successful telephone encounter to Milos Milligan, 67 year old male for transition of care/ongoing follow up on  [facility] inpatient stay January 5-14,2019 for severe recurrent major depression without  Psychotic features, uncomplicated alcohol dependence, hypotension due to drugs and today's ED  Visit for generalized pain.  Pt's history includes but not  limited to Cirrhosis of liver with ascites, GAD, elevated calcium score, tobacco use disorder, chronic join pain.   Spoke with pt, HIPAA identifiers verified.   Pt reports  Depression medication so far has not helped much, trying to stay positive as maybe the medication needs more time to work.   Pt reports he did realize he was only taking his Buspar twice a day when it was ordered three times to which he has started, maybe that will make a difference in anxiety.  Pt reports has followed up with  Therapist, is good for him, to follow up again tomorrow morning.      Pt reports on today's ED visit, woke up this am racked with pain (bad) had brother take him to ED but after sitting there 3 hours- left.   Pt reports he has a high tolerance for physical pain but zero for emotional.   RN CM discussed with pt view in EMR could see his call to PCP office today about pain/offer for an appointment to see PCP tomorrow to which he declined.   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    Plan:  As discussed with pt, plan to follow up again tomorrow telephonically- check on clinical status/complete  Transition of care.   [name].   [place] Care Management  [phone]

## 2017-07-13 NOTE — ED Triage Notes (Signed)
Patient ambulatory to triage with steady gait, without difficulty or distress noted; pt reports having generalized pain this morning; denies any accomp symptoms

## 2017-07-13 NOTE — ED Notes (Addendum)
Pt states that he woke up about two hours ago "hurting from head to toe". Pt stated that he then took 4 advil. Pt reports decreased appetite. Pt has Hx of cirrhosis. Dr. Archie Balboa at bedside.

## 2017-07-14 ENCOUNTER — Other Ambulatory Visit: Payer: Self-pay | Admitting: *Deleted

## 2017-07-14 NOTE — Patient Outreach (Signed)
07/14/2017   Successful telephone encounter to Andrew Carey, 67 year old male for final  transition of care call- ongoing follow up on recent [place] in patient stay January 5-14,2019 for severe recurrent  Major depression with psychotic features, uncomplicated alcohol dependence, hypotension due  To drugs.  Spoke with pt, HIPAA identifiers verified.  Pt reports had a good session with therapist  Today, the reality of his life span hit him.  Pt reports he has dealt with depression since he was  Little, taking all of his medications, feels Pristiq is helping. Pt reports he was told  can follow up with his PCP to have medications refilled which includes ones for depression.  Pt reports his pain is better than when he spoke with RN CM (yesterday), did not follow up with PCP.  RN CM discussed  With pt today final transition of care call, plan to follow up again next month telephonically- check  On clinical status.    Plan:  As discussed with pt, plan to follow up again next month telephonically.     Andrew Carey.   [facility]  [phone]

## 2017-07-18 ENCOUNTER — Other Ambulatory Visit: Payer: Self-pay | Admitting: Family Medicine

## 2017-07-18 MED ORDER — OXYCODONE HCL 15 MG PO TABS: ORAL_TABLET | ORAL | 0 refills | Status: DC

## 2017-07-18 NOTE — Telephone Encounter (Signed)
Oxycodone 15 MG sent to [facility] - pt states he has transportation issues and will only be able to pick the medication up today or tomorrow.  He is requesting a call back once the medication is called into [facility].

## 2017-07-31 ENCOUNTER — Telehealth: Payer: Self-pay | Admitting: Family Medicine

## 2017-07-31 DIAGNOSIS — K7031 Alcoholic cirrhosis of liver with ascites: Secondary | ICD-10-CM

## 2017-07-31 NOTE — Telephone Encounter (Signed)
Patient states for the last few weeks he has felt off. He is concerned that he may have had some changes with cirrhosis of the live disease. Patient is requesting labs for liver panel and whatever else Dr. [name] thinks is necessary. Patient stated he is also willing to be referred to a specialist. Please advise?

## 2017-07-31 NOTE — Telephone Encounter (Signed)
Pt has questions about having labs done or if he needs to go to a specialists, pt hasn't been feeling right and has be feeling a little different. Pt states what ever Dr. Lynnda Child like to do. Please advise. Thanks CC

## 2017-08-03 NOTE — Telephone Encounter (Signed)
Please print pended order to check liver functions and leave at lab for patient.

## 2017-08-14 ENCOUNTER — Other Ambulatory Visit: Payer: Self-pay | Admitting: Family Medicine

## 2017-08-14 ENCOUNTER — Other Ambulatory Visit: Payer: Self-pay | Admitting: *Deleted

## 2017-08-14 ENCOUNTER — Encounter: Payer: Self-pay | Admitting: *Deleted

## 2017-08-14 NOTE — Patient Outreach (Signed)
Successful telephone encounter to Andrew Carey, 67 year old male- check on current clinical status as this RN CM followed pt for transition of care/recent [facility] in patient stay January 5-14,2019 for severe recurrent major depression without  Psychotic features, uncomplicated alcohol dependence, hypotension due to drugs.   Transition of care program completed  07/14/17.   Spoke with pt, HIPAA identifiers verified.  Pt reports most days does all right, occasionally has down days considering his  Situation (health, family).    Pt reports he does what is suppose to do- taking all of his medications, follows up with Therapist once a week, sessions going well/helpful. Pt reports follows up with psychiatrist, to see PCP in a couple of weeks, MD to look at his medications.  RN CM discussed with pt plan to [facility], no further case management needs to  Which pt agreed.   Pt verified has RN CM's contact information and [facility] main office number to call if needs arise in the  Future along with 24 hour nurse line.   Plan:  As discussed with pt, plan to discharge from [facility] CM services.          Plan to inform Dr. [name] of discharge, send by in basket case closure letter.           Plan to inform [facility] CMA to close case.   [name].   [facility]  [phone]

## 2017-08-14 NOTE — Telephone Encounter (Signed)
Pt. Needs refill of Oxycodone 15 mg . sent to [facility] on [address]. and [address].  (old [place])

## 2017-08-16 MED ORDER — OXYCODONE HCL 15 MG PO TABS: ORAL_TABLET | ORAL | 0 refills | Status: DC

## 2017-08-16 NOTE — Telephone Encounter (Signed)
Pt requesting status of this medication.  Pt states he is out of medication yesterday.

## 2017-09-12 ENCOUNTER — Other Ambulatory Visit: Payer: Self-pay | Admitting: Family Medicine

## 2017-09-12 MED ORDER — OXYCODONE HCL 15 MG PO TABS: ORAL_TABLET | ORAL | 0 refills | Status: DC

## 2017-09-12 NOTE — Telephone Encounter (Signed)
Pt contacted office for refill request on the following medications:  oxyCODONE (ROXICODONE) 15 MG immediate release tablet  [facility] [address]/ [place] Rx: 08/16/17 LOV: 04/12/17 NOV: 10/10/17  Pt stated he isn't trying to get the medication early he just wanted to make sure it was sent into the pharmacy by Friday so he could pick it up when it was due to be refilled. Please advise. Thanks TNP

## 2017-10-10 ENCOUNTER — Encounter: Payer: Self-pay | Admitting: Family Medicine

## 2017-10-10 ENCOUNTER — Ambulatory Visit (INDEPENDENT_AMBULATORY_CARE_PROVIDER_SITE_OTHER): Payer: Medicare Other | Admitting: Family Medicine

## 2017-10-10 VITALS — BP 104/70 | HR 103 | Temp 97.7°F | Resp 16 | Ht 72.0 in | Wt 233.0 lb

## 2017-10-10 DIAGNOSIS — K7031 Alcoholic cirrhosis of liver with ascites: Secondary | ICD-10-CM | POA: Diagnosis not present

## 2017-10-10 DIAGNOSIS — F4323 Adjustment disorder with mixed anxiety and depressed mood: Secondary | ICD-10-CM | POA: Diagnosis not present

## 2017-10-10 DIAGNOSIS — F332 Major depressive disorder, recurrent severe without psychotic features: Secondary | ICD-10-CM

## 2017-10-10 DIAGNOSIS — M2550 Pain in unspecified joint: Secondary | ICD-10-CM

## 2017-10-10 DIAGNOSIS — G8929 Other chronic pain: Secondary | ICD-10-CM | POA: Diagnosis not present

## 2017-10-10 MED ORDER — OXYCODONE HCL 15 MG PO TABS
ORAL_TABLET | ORAL | 0 refills | Status: DC
Start: 2017-10-10 — End: 2017-11-13

## 2017-10-10 NOTE — Progress Notes (Signed)
Patient: Andrew Carey Male    DOB: 04-Oct-1950   67 y.o.   MRN: [id number] Visit Date: 10/10/2017  Today's Provider: Lelon Huh, MD   Chief Complaint  Patient presents with  . Follow-up   Subjective:    HPI  Moderate recurrent major depression ([place]) From 04/12/2017-no changes. He was briefly hospitalized in January and started on Prestiq and buspirone, but stopped prestiq because he things it made him more anxious and buspirone didn't seem to help at all. He has seen been seeing a therapist weekly and feels that it has helped quite a bit. Is still taking alprazolam which he feels helped, but no other medications for mood or insomnia. He has not had any alcohol for a few months. He was seen at liver clinic at [facility] last month and not critical enough to put on transplant list. He has follow up in the fall.   Alcoholic cirrhosis of liver with ascites ([place]) From 04/12/2017-no changes.   Panic disorder From 04/12/2017-no changes. Doing well on scheduled alprazolam.   Chronic joint pain From 04/12/2017-no changes. increased oxycodone to 7.5mg  BID which he feels is helping quite a bit.    Allergies  Allergen Reactions  . Fluoxetine     Hallucinations  . Codeine Nausea And Vomiting and Nausea Only     Current Outpatient Medications:  .  ALPRAZolam (XANAX) 1 MG tablet, TAKE 1 TABLET PO TID PRN FOR ANXIETY, Disp: , Rfl: 4 .  furosemide (LASIX) 80 MG tablet, take 40mg  by mouth once daily, Disp: 30 tablet, Rfl: 5 .  lactulose (CHRONULAC) 10 GM/15ML solution, TAKE 30 MILLILITERS BY MOUTH THREE TIMES A DAY IF NEEDED, Disp: 1800 mL, Rfl: 12 .  oxyCODONE (ROXICODONE) 15 MG immediate release tablet, take 1/2 tablets by mouth twice a day, Disp: 30 tablet, Rfl: 0 .  RA VITAMIN B-1 100 MG TABS, take 1 tablet by mouth once daily, Disp: 100 tablet, Rfl: 4 .  spironolactone (ALDACTONE) 100 MG tablet, take 1 tablets by mouth once daily, Disp: 60 tablet, Rfl: 12 .  ARIPiprazole  (ABILIFY) 2 MG tablet, Take 1 tablet (2 mg total) by mouth daily. (Patient not taking: Reported on 10/10/2017), Disp: 30 tablet, Rfl: 0 .  busPIRone (BUSPAR) 5 MG tablet, Take 5 mg by mouth 3 (three) times daily., Disp: , Rfl:  .  desvenlafaxine (PRISTIQ) 50 MG 24 hr tablet, Take 1 tablet (50 mg total) by mouth daily. (Patient not taking: Reported on 10/10/2017), Disp: 30 tablet, Rfl: 0 .  gabapentin (NEURONTIN) 400 MG capsule, Take 1 capsule (400 mg total) by mouth 3 (three) times daily. (Patient not taking: Reported on 10/10/2017), Disp: 90 capsule, Rfl: 0 .  thiamine (VITAMIN B-1) 100 MG tablet, Take 100 mg by mouth daily., Disp: , Rfl: 2 .  traZODone (DESYREL) 100 MG tablet, Take 1 tablet (100 mg total) by mouth at bedtime as needed for sleep. (Patient not taking: Reported on 10/10/2017), Disp: 30 tablet, Rfl: 0  Review of Systems  Constitutional: Negative for appetite change, chills and fever.  Respiratory: Negative for chest tightness, shortness of breath and wheezing.   Cardiovascular: Negative for chest pain and palpitations.  Gastrointestinal: Negative for abdominal pain, nausea and vomiting.    Social History   Tobacco Use  . Smoking status: Current Every Day Smoker    Packs/day: 1.00    Years: 50.00    Pack years: 50.00    Types: Cigarettes  . Smokeless tobacco: Never Used  Substance Use Topics  . Alcohol use: No    Frequency: Never    Comment: 6 pack daily.     Objective:   BP 104/70 (BP Location: Right Arm, Patient Position: Sitting, Cuff Size: Large)   Pulse (!) 103   Temp 97.7 F (36.5 C) (Oral)   Resp 16   Ht 6' (1.829 m)   Wt 233 lb (105.7 kg)   SpO2 97%   BMI 31.60 kg/m  Vitals:   10/10/17 1022  BP: 104/70  Pulse: (!) 103  Resp: 16  Temp: 97.7 F (36.5 C)  TempSrc: Oral  SpO2: 97%  Weight: 233 lb (105.7 kg)  Height: 6' (1.829 m)     Physical Exam   General Appearance:    Alert, cooperative, no distress  Eyes:    PERRL, conjunctiva/corneas clear,  EOM's intact       Lungs:     Clear to auscultation bilaterally, respirations unlabored  Heart:    Regular rate and rhythm  Neurologic:   Awake, alert, oriented x 3. No apparent focal neurological           defect.           Assessment & Plan:     1. Adjustment disorder with mixed anxiety and depressed mood Doing much better, has come to terms with his illness and less pessimistic about lifespan. Continues with regular visits with counselor. Off of antidepressants, but benefiting from alprazolam. Continues to abstain from alcohol.   2. Severe recurrent major depression without psychotic features ([place])   3. Alcoholic cirrhosis of liver with ascites ([place])  4. Arthritis pains Doing well with 1/2 tablet oxycodone twice a day most days.    Continue current medications.    Follow up in 4-6 months.         Lelon Huh, MD  [facility]

## 2017-10-31 ENCOUNTER — Other Ambulatory Visit: Payer: Self-pay | Admitting: Family Medicine

## 2017-11-13 ENCOUNTER — Other Ambulatory Visit: Payer: Self-pay

## 2017-11-13 DIAGNOSIS — M2550 Pain in unspecified joint: Principal | ICD-10-CM

## 2017-11-13 DIAGNOSIS — G8929 Other chronic pain: Secondary | ICD-10-CM

## 2017-11-13 MED ORDER — OXYCODONE HCL 15 MG PO TABS: ORAL_TABLET | ORAL | 0 refills | Status: DC

## 2017-11-14 IMAGING — CR DG CHEST 2V
1 series · 2 of 2 positions shown · non-contrast
Comparison: 01/10/2016, 01/02/2012

CLINICAL DATA: Pt states he has a cough, congestion, SOB and chills
for 1-2 months. meds not helping. History of COPD, enlarged heart,
smiker, stroke.

EXAM:
CHEST  2 VIEW

[Series 1: dg chest 2 view · 0.14mm/px · 2 of 2 slices shown]
[im 1/2]
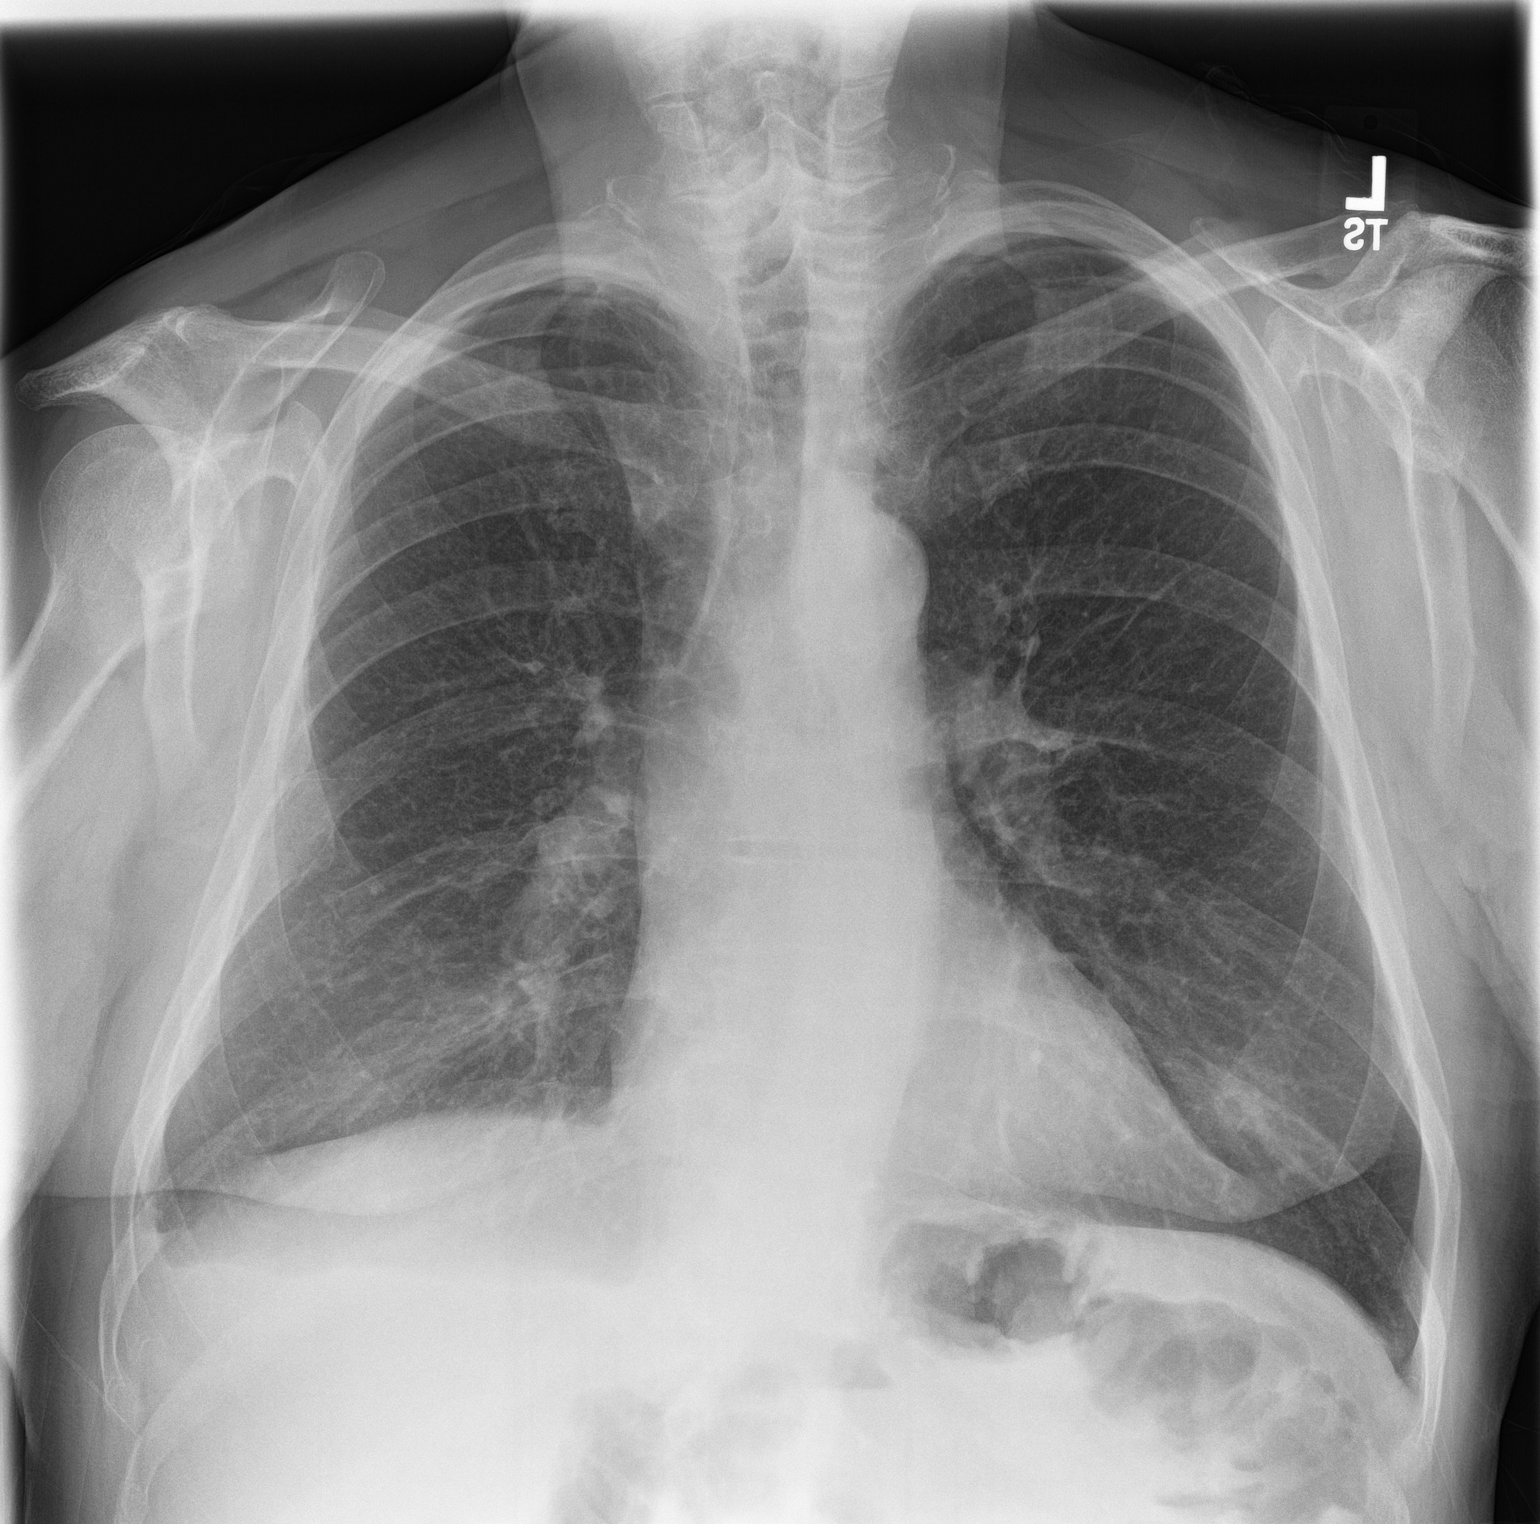
[im 2/2]
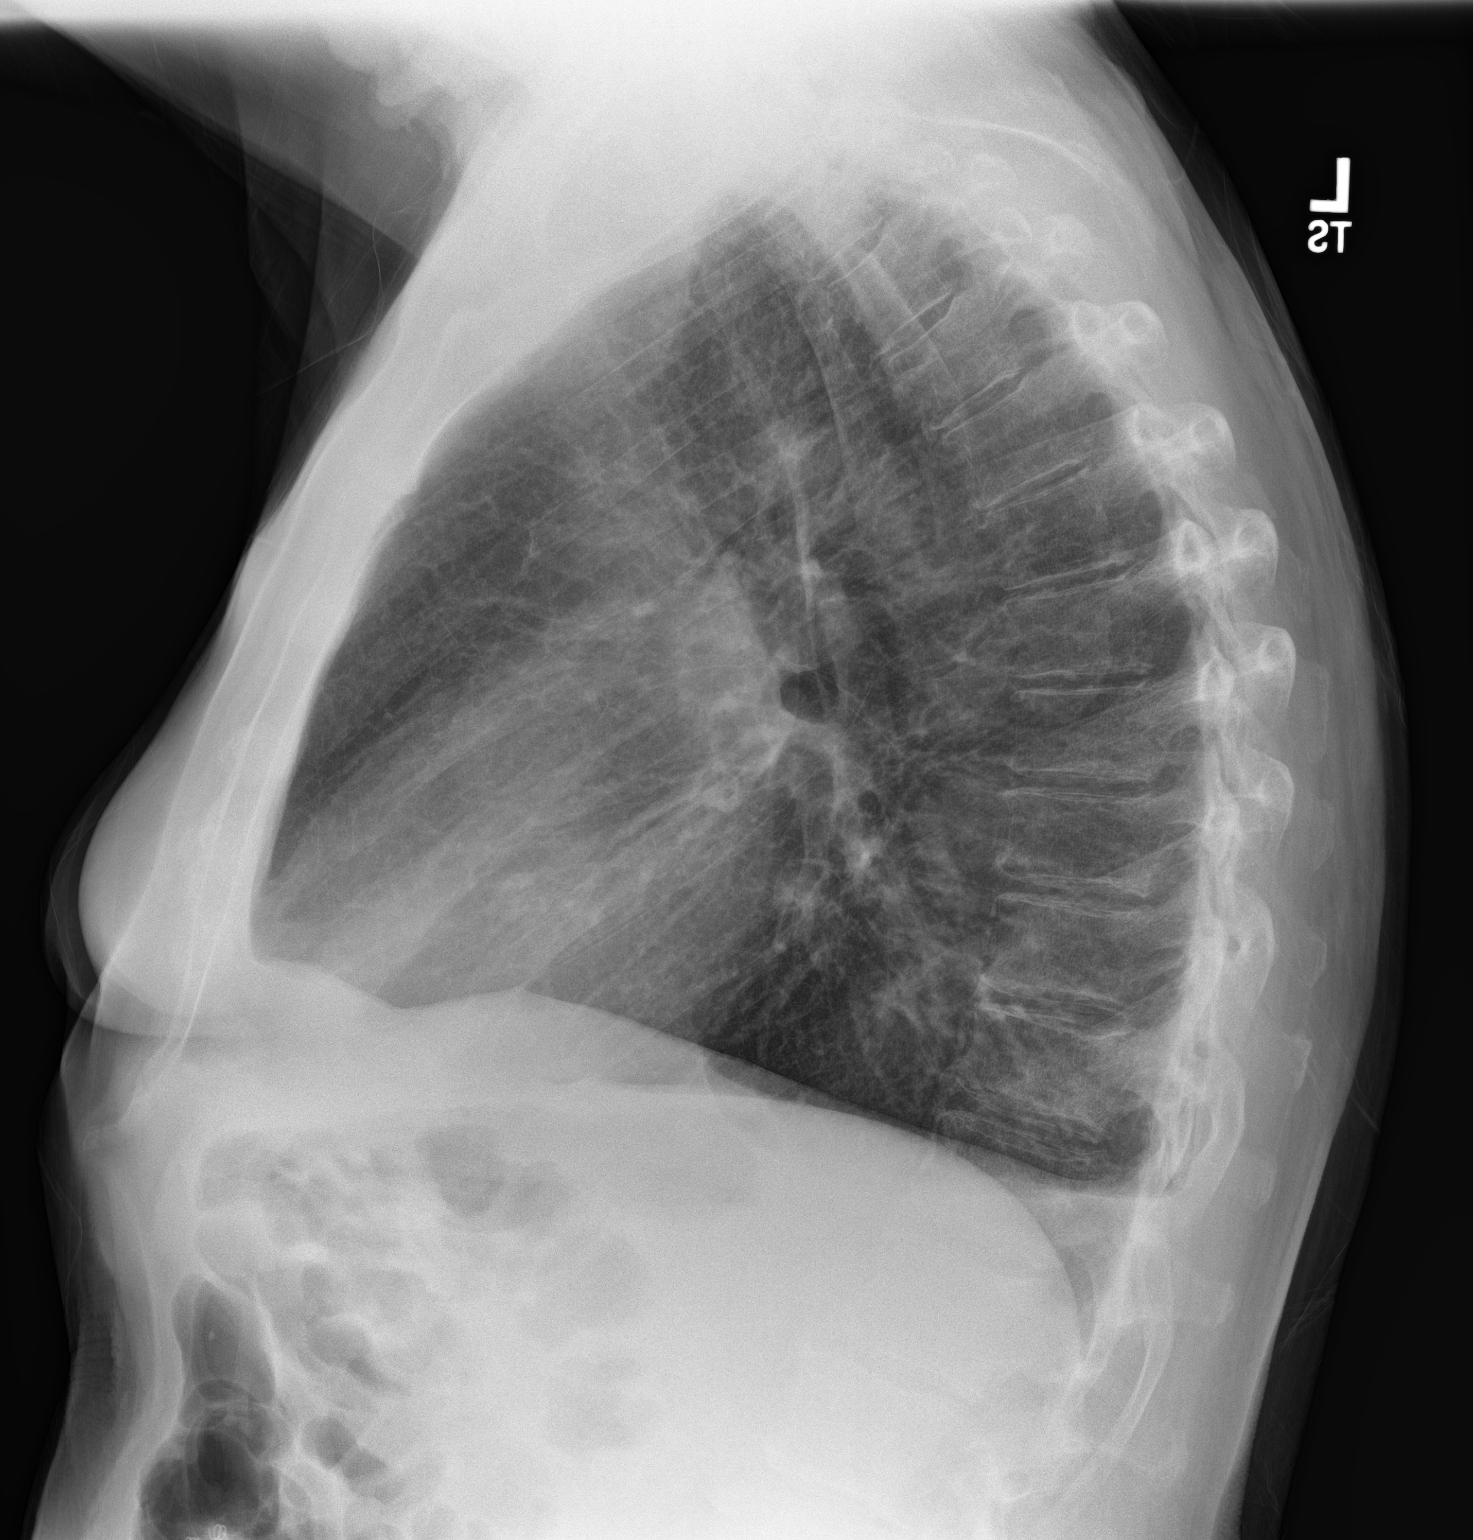

[2 of 2 positions shown; findings below may reference images not displayed]

FINDINGS: There is a small area of hazy left lower lobe airspace disease
concerning for atelectasis or pneumonia. There is no pleural
effusion or pneumothorax. The heart and mediastinal contours are
unremarkable.

The osseous structures are unremarkable.
IMPRESSION: 1. Small area of hazy left lower lobe airspace disease concerning
for atelectasis or pneumonia.

## 2017-11-16 NOTE — Progress Notes (Signed)
Patient: Andrew Carey Male    DOB: 1950/06/17   67 y.o.   MRN: [id number] Visit Date: 11/16/2017  Today's Provider: Lelon Huh, MD   Chief Complaint  Patient presents with  . Leg Swelling   Subjective:    HPI Patient comes in today c/o swelling in right foot and ankle off and on about 4-5 weeks. Having some soreness around ankle and forefoot. No known injury. No swelling in left leg. No increase in abdominal swelling. He has gained some weight, but states he has been very fatigued and been less active. Has not had any alcohol recently. Denies dyspnea, palpations or chest pains.   Wt Readings from Last 3 Encounters:  11/17/17 240 lb (108.9 kg)  10/10/17 233 lb (105.7 kg)  07/13/17 220 lb (99.8 kg)   He also states he does not want to continue going to [facility] for follow up of cirrhosis. He doesn't think he is going to qualify for liver transplant and they haven't been doing anything for him there. He requests to be followed locally for this.      Allergies  Allergen Reactions  . Fluoxetine     Hallucinations  . Codeine Nausea And Vomiting and Nausea Only  . Trazodone And Nefazodone     Vivid dreams     Current Outpatient Medications:  .  ALPRAZolam (XANAX) 1 MG tablet, TAKE 1 TABLET BY MOUTH THREE TIMES DAILY AS NEEDED FOR ANXIETY, Disp: 90 tablet, Rfl: 3 .  ARIPiprazole (ABILIFY) 2 MG tablet, Take 1 tablet (2 mg total) by mouth daily. (Patient not taking: Reported on 10/10/2017), Disp: 30 tablet, Rfl: 0 .  furosemide (LASIX) 80 MG tablet, take 40mg  by mouth once daily, Disp: 30 tablet, Rfl: 5 .  lactulose (CHRONULAC) 10 GM/15ML solution, TAKE 30 MILLILITERS BY MOUTH THREE TIMES A DAY IF NEEDED, Disp: 1800 mL, Rfl: 12 .  oxyCODONE (ROXICODONE) 15 MG immediate release tablet, take 1/2 tablets by mouth twice a day, Disp: 30 tablet, Rfl: 0 .  RA VITAMIN B-1 100 MG TABS, take 1 tablet by mouth once daily, Disp: 100 tablet, Rfl: 4 .  spironolactone (ALDACTONE) 100 MG  tablet, take 1 tablets by mouth once daily, Disp: 60 tablet, Rfl: 12 .  thiamine (VITAMIN B-1) 100 MG tablet, Take 100 mg by mouth daily., Disp: , Rfl: 2  Review of Systems  Constitutional: Negative for appetite change, chills and fever.  Respiratory: Negative for chest tightness, shortness of breath and wheezing.   Cardiovascular: Negative for chest pain and palpitations.  Gastrointestinal: Negative for abdominal pain, nausea and vomiting.    Social History   Tobacco Use  . Smoking status: Current Every Day Smoker    Packs/day: 1.00    Years: 50.00    Pack years: 50.00    Types: Cigarettes  . Smokeless tobacco: Never Used  Substance Use Topics  . Alcohol use: No    Frequency: Never    Comment: 6 pack daily.     Objective:   BP 118/72 (BP Location: Left Arm, Patient Position: Sitting, Cuff Size: Large)   Pulse (!) 102   Temp 99.7 F (37.6 C)   Resp 16   Ht 6' (1.829 m)   Wt 240 lb (108.9 kg)   SpO2 95%   BMI 32.55 kg/m     Physical Exam  General appearance: alert, well developed, well nourished, cooperative and in no distress Head: Normocephalic, without obvious abnormality, atraumatic Respiratory: Respirations even and unlabored,  normal respiratory rate Extremities:mild swelling and tenderness right medial ankle and forefoot. No erythema. No gross deformities. No LLE swelling.      Assessment & Plan:     1. Edema, local  - Comprehensive metabolic panel - CBC  2. Acute right ankle pain check- Uric acid Consider xray if labs normal. Not a candidate for NSAIDs.   3. Other fatigue  - Comprehensive metabolic panel - CBC - TSH  4. Alcoholic cirrhosis of liver with ascites (HCC) Currently abstaining from alcohol consumption.  He requests to be followed locally rather than continue to travelling to [place].         Lelon Huh, MD  [facility]

## 2017-11-17 ENCOUNTER — Encounter: Payer: Self-pay | Admitting: Family Medicine

## 2017-11-17 ENCOUNTER — Ambulatory Visit (INDEPENDENT_AMBULATORY_CARE_PROVIDER_SITE_OTHER): Payer: Medicare Other | Admitting: Family Medicine

## 2017-11-17 VITALS — BP 118/72 | HR 102 | Temp 99.7°F | Resp 16 | Ht 72.0 in | Wt 240.0 lb

## 2017-11-17 DIAGNOSIS — R5383 Other fatigue: Secondary | ICD-10-CM

## 2017-11-17 DIAGNOSIS — K7031 Alcoholic cirrhosis of liver with ascites: Secondary | ICD-10-CM

## 2017-11-17 DIAGNOSIS — M25571 Pain in right ankle and joints of right foot: Secondary | ICD-10-CM

## 2017-11-17 DIAGNOSIS — R609 Edema, unspecified: Secondary | ICD-10-CM

## 2017-11-18 LAB — COMPREHENSIVE METABOLIC PANEL
ALBUMIN: 4.1 g/dL (ref 3.6–4.8)
ALT: 11 IU/L (ref 0–44)
AST: 14 IU/L (ref 0–40)
Albumin/Globulin Ratio: 1.3 (ref 1.2–2.2)
Alkaline Phosphatase: 83 IU/L (ref 39–117)
BUN / CREAT RATIO: 19 (ref 10–24)
BUN: 18 mg/dL (ref 8–27)
Bilirubin Total: 0.5 mg/dL (ref 0.0–1.2)
CALCIUM: 9.2 mg/dL (ref 8.6–10.2)
CO2: 18 mmol/L — AB (ref 20–29)
Chloride: 101 mmol/L (ref 96–106)
Creatinine, Ser: 0.93 mg/dL (ref 0.76–1.27)
GFR, EST AFRICAN AMERICAN: 99 mL/min/{1.73_m2} (ref >59)
GFR, EST NON AFRICAN AMERICAN: 86 mL/min/{1.73_m2} (ref >59)
GLOBULIN, TOTAL: 3.2 g/dL (ref 1.5–4.5)
Glucose: 117 mg/dL — ABNORMAL HIGH (ref 65–99)
Potassium: 4.4 mmol/L (ref 3.5–5.2)
SODIUM: 134 mmol/L (ref 134–144)
TOTAL PROTEIN: 7.3 g/dL (ref 6.0–8.5)

## 2017-11-18 LAB — CBC
Hematocrit: 44.3 % (ref 37.5–51.0)
Hemoglobin: 15.3 g/dL (ref 13.0–17.7)
MCH: 32.7 pg (ref 26.6–33.0)
MCHC: 34.5 g/dL (ref 31.5–35.7)
MCV: 95 fL (ref 79–97)
PLATELETS: 193 10*3/uL (ref 150–450)
RBC: 4.68 x10E6/uL (ref 4.14–5.80)
RDW: 12.6 % (ref 12.3–15.4)
WBC: 10.4 10*3/uL (ref 3.4–10.8)

## 2017-11-18 LAB — TSH: TSH: 3.42 u[IU]/mL (ref 0.450–4.500)

## 2017-11-18 LAB — URIC ACID: Uric Acid: 7.2 mg/dL (ref 3.7–8.6)

## 2017-11-19 ENCOUNTER — Other Ambulatory Visit: Payer: Self-pay | Admitting: Family Medicine

## 2017-11-19 MED ORDER — COLCHICINE 0.6 MG PO TABS: 0.6000 mg | ORAL_TABLET | Freq: Every day | ORAL | 0 refills | Status: DC

## 2017-11-20 ENCOUNTER — Telehealth: Payer: Self-pay | Admitting: *Deleted

## 2017-11-20 NOTE — Telephone Encounter (Signed)
No answer and no vm. Will try again later.  

## 2017-11-20 NOTE — Telephone Encounter (Signed)
-----   Message from Birdie Sons, MD sent at 11/19/2017  9:46 PM EDT ----- Borderline elevated of uric acid, which is suggestive of gout. Recommend trial of colchicine, have sent prescription to pharmacy. If not better in 7-10 days then should call back for referral to orthopedist for ankle pain and swelling.

## 2017-11-22 NOTE — Telephone Encounter (Signed)
No answer and no vm

## 2017-11-27 NOTE — Telephone Encounter (Signed)
Pt advised.  He reports just starting Colchicine two days ago.  He will call back if there is no improvement.   Thanks,   -Mickel Baas

## 2017-11-29 ENCOUNTER — Encounter: Payer: Self-pay | Admitting: *Deleted

## 2017-12-01 ENCOUNTER — Ambulatory Visit: Payer: Self-pay | Admitting: Family Medicine

## 2017-12-04 ENCOUNTER — Ambulatory Visit: Payer: Self-pay | Admitting: Family Medicine

## 2017-12-07 ENCOUNTER — Telehealth: Payer: Self-pay | Admitting: Family Medicine

## 2017-12-07 DIAGNOSIS — M2550 Pain in unspecified joint: Principal | ICD-10-CM

## 2017-12-07 DIAGNOSIS — G8929 Other chronic pain: Secondary | ICD-10-CM

## 2017-12-07 MED ORDER — OXYCODONE HCL 15 MG PO TABS: ORAL_TABLET | ORAL | 0 refills | Status: DC

## 2017-12-07 NOTE — Telephone Encounter (Signed)
Pt contacted office for refill request on the following medications:  oxyCODONE (ROXICODONE) 15 MG immediate release tablet  [place]   Last Rx: 11/13/17 LOV: 11/17/17 NOV: 12/11/17  Pt stated that he has had an increase in pain the 2 weeks and half a tablet wasn't helping so some days he would take a whole tablet twice a day. Pt stated that he ran out of the medication about 4 days ago. Since running out of the medication pt has been taking 5 200 mg tablets of Advil (1000mg  total) in the morning and about 10 hours later he takes another 5 tablets of Advil (1000mg ). Pt stated when he takes the second dose of Advil in the evening if he is still hurting to the point that he can't sleep he then takes 2 500 mg tablets of Tylenol (1000mg ). Pt stated that he has been doing this for the last 4 days and with his liver he wasn't sure if the Advil and Tylenol would do more damage to his liver. Pt is requesting and early refill or advise of what he can try for the pain to hold him until his refill is due or his OV on 12/11/17. Please advise. Thanks TNP

## 2017-12-07 NOTE — Telephone Encounter (Signed)
na

## 2017-12-07 NOTE — Telephone Encounter (Signed)
He should never take Advil. With his liver it can triger acute kidney and liver failure. He can take 2-3 tylenol a day, but no more.   Have sent refill oxycodone to pharmacy.

## 2017-12-08 NOTE — Telephone Encounter (Signed)
Per Dr. [name], ok to fill early. I called [facility] and spoke with pharmacist Audelia Acton, and gave the "OK". Audelia Acton states that they didn't have this medication in stock, but will have it today at 4pm. Per Audelia Acton, patient is aware of this and is fine with waiting until then to pick up his prescription.

## 2017-12-08 NOTE — Telephone Encounter (Signed)
Pt stated [facility] left him a message stating they couldn't refill the medication until 12/12/17 unless someone from out office contacted them. Please advise. Thanks TNP

## 2017-12-08 NOTE — Telephone Encounter (Signed)
I advised patient as below in regards to not taking Advil. Patient verbally voiced understanding. Patient states the pharmacy told him that he couldn't get the prescription for Oxycodone filled until 12/12/2017 unless they had an "ok" to do so. Is it ok for patient to fill this prescription now?

## 2017-12-11 ENCOUNTER — Encounter: Payer: Self-pay | Admitting: Family Medicine

## 2017-12-11 ENCOUNTER — Ambulatory Visit (INDEPENDENT_AMBULATORY_CARE_PROVIDER_SITE_OTHER): Payer: Medicare Other | Admitting: Family Medicine

## 2017-12-11 ENCOUNTER — Ambulatory Visit
Admission: RE | Admit: 2017-12-11 | Discharge: 2017-12-11 | Disposition: A | Discharge status: Home / Self Care | Payer: Medicare Other | Source: Ambulatory Visit | Attending: Family Medicine | Admitting: Family Medicine

## 2017-12-11 VITALS — BP 102/72 | HR 100 | Temp 97.9°F | Resp 18 | Wt 231.0 lb

## 2017-12-11 DIAGNOSIS — M25532 Pain in left wrist: Secondary | ICD-10-CM | POA: Diagnosis not present

## 2017-12-11 DIAGNOSIS — M79602 Pain in left arm: Secondary | ICD-10-CM | POA: Insufficient documentation

## 2017-12-11 DIAGNOSIS — M79632 Pain in left forearm: Secondary | ICD-10-CM | POA: Diagnosis not present

## 2017-12-11 DIAGNOSIS — M19032 Primary osteoarthritis, left wrist: Secondary | ICD-10-CM | POA: Insufficient documentation

## 2017-12-11 DIAGNOSIS — K7031 Alcoholic cirrhosis of liver with ascites: Secondary | ICD-10-CM | POA: Diagnosis not present

## 2017-12-11 DIAGNOSIS — M79639 Pain in unspecified forearm: Secondary | ICD-10-CM | POA: Diagnosis present

## 2017-12-11 MED ORDER — PREDNISONE 20 MG PO TABS: 20.0000 mg | ORAL_TABLET | Freq: Two times a day (BID) | ORAL | 0 refills | Status: DC

## 2017-12-11 NOTE — Patient Instructions (Signed)
Go to the [facility] on [facility] for left arm Xray

## 2017-12-11 NOTE — Progress Notes (Signed)
Patient: Andrew Carey Male    DOB: 1950/11/22   67 y.o.   MRN: [id number] Visit Date: 12/11/2017  Today's Provider: Lelon Huh, MD   Chief Complaint  Patient presents with  . Arm Pain    x 3 weeks   Subjective:    Arm Pain   Incident onset: 3 weeks ago. There was no injury mechanism. The pain is present in the left forearm, right forearm, right hand and left hand. The pain has been worsening (pain is becoming more constant) since the incident. Associated symptoms include muscle weakness (in both arms). Pertinent negatives include no chest pain, numbness or tingling. Treatments tried: Oxycodone. The treatment provided moderate relief.  Pain has becomes so severe that it is affecting him from performing activities of daily living. Has had to take extra oxycodone occasionally due to pain.   He was seen a few weeks ago with ankle and foot pain and prescribed colchicine, and states that that pain has improved, swelling has mostly resolved.   Allergies  Allergen Reactions  . Fluoxetine     Hallucinations  . Codeine Nausea And Vomiting and Nausea Only  . Trazodone And Nefazodone     Vivid dreams     Current Outpatient Medications:  .  ALPRAZolam (XANAX) 1 MG tablet, TAKE 1 TABLET BY MOUTH THREE TIMES DAILY AS NEEDED FOR ANXIETY, Disp: 90 tablet, Rfl: 3 .  colchicine 0.6 MG tablet, Take 1 tablet (0.6 mg total) by mouth daily., Disp: 30 tablet, Rfl: 0 .  furosemide (LASIX) 80 MG tablet, take 54m by mouth once daily, Disp: 30 tablet, Rfl: 5 .  lactulose (CHRONULAC) 10 GM/15ML solution, TAKE 30 MILLILITERS BY MOUTH THREE TIMES A DAY IF NEEDED, Disp: 1800 mL, Rfl: 12 .  oxyCODONE (ROXICODONE) 15 MG immediate release tablet, take 1/2 tablets by mouth two to three times a day as needed, Disp: 30 tablet, Rfl: 0 .  spironolactone (ALDACTONE) 100 MG tablet, take 1 tablets by mouth once daily, Disp: 60 tablet, Rfl: 12 .  thiamine (VITAMIN B-1) 100 MG tablet, Take 100 mg by mouth  daily., Disp: , Rfl: 2 .  ARIPiprazole (ABILIFY) 2 MG tablet, Take 1 tablet (2 mg total) by mouth daily. (Patient not taking: Reported on 10/10/2017), Disp: 30 tablet, Rfl: 0  Review of Systems  Constitutional: Negative for appetite change, chills and fever.  Respiratory: Positive for chest tightness. Negative for shortness of breath and wheezing.   Cardiovascular: Negative for chest pain and palpitations.  Gastrointestinal: Negative for abdominal pain, nausea and vomiting.  Musculoskeletal: Positive for arthralgias (hand pain), joint swelling (left wrist ) and myalgias (arm pain).  Neurological: Negative for tingling and numbness.    Social History   Tobacco Use  . Smoking status: Current Every Day Smoker    Packs/day: 1.00    Years: 50.00    Pack years: 50.00    Types: Cigarettes  . Smokeless tobacco: Never Used  Substance Use Topics  . Alcohol use: Yes    Frequency: Never    Comment: occasional glass of red wine   Objective:   BP 102/72 (BP Location: Left Arm, Patient Position: Sitting, Cuff Size: Large)   Pulse 100   Temp 97.9 F (36.6 C) (Oral)   Resp 18   Wt 231 lb (104.8 kg)   SpO2 98% Comment: room air  BMI 31.33 kg/m  Vitals:   12/11/17 0945  BP: 102/72  Pulse: 100  Resp: 18  Temp: 97.9  F (36.6 C)  TempSrc: Oral  SpO2: 98%  Weight: 231 lb (104.8 kg)     Physical Exam   General Appearance:    Alert, cooperative, no distress  Heart:    Regular rate and rhythm  Neurologic:   Awake, alert, oriented x 3. No apparent focal neurological           defect.   MS:  Distal dorsal aspect of distal forearms and wrists left worse than right. No gross deformities. FROM of wrists and fingers.        Assessment & Plan:     1. Left arm pain  - CK (Creatine Kinase) - Sed Rate (ESR) - Magnesium - DG Forearm Left; Future - DG Wrist Complete Left; Future  Empiric prednisone 17m BID for about 5 days  2. Alcoholic cirrhosis of liver with ascites ([place] Advised  to NOT take NSAIDs.        DLelon Huh MD  [facility]

## 2017-12-12 ENCOUNTER — Telehealth: Payer: Self-pay | Admitting: *Deleted

## 2017-12-12 LAB — MAGNESIUM: Magnesium: 2 mg/dL (ref 1.6–2.3)

## 2017-12-12 LAB — CK: CK TOTAL: 43 U/L (ref 24–204)

## 2017-12-12 LAB — SEDIMENTATION RATE: SED RATE: 85 mm/h — AB (ref 0–30)

## 2017-12-12 NOTE — Telephone Encounter (Signed)
No answer and no vm. Will try again later.  

## 2017-12-12 NOTE — Telephone Encounter (Signed)
-----   Message from Birdie Sons, MD sent at 12/12/2017  8:09 AM EDT ----- Sed rate is up which is a sign of general inflammation, prednisone should get this under control. Continue prednisone that was prescribed yesterday. Let me know who leg pains are doing at the end of the week, if better will need to continue prednisone for awhile.

## 2017-12-15 ENCOUNTER — Telehealth: Payer: Self-pay

## 2017-12-15 DIAGNOSIS — M79602 Pain in left arm: Secondary | ICD-10-CM

## 2017-12-15 MED ORDER — PREDNISONE 10 MG PO TABS: ORAL_TABLET | ORAL | 0 refills | Status: DC

## 2017-12-15 NOTE — Telephone Encounter (Signed)
-----   Message from Birdie Sons, MD sent at 12/15/2017  8:26 AM EDT ----- Please check with patient and see how his leg pains are doing. If getting better will keep him prednisone for a few weeks.

## 2017-12-15 NOTE — Telephone Encounter (Signed)
Pt advised.  He reports his leg pain has improved significantly.  He would like a refill of prednisone sent to [facility],   -Mickel Baas

## 2017-12-15 NOTE — Telephone Encounter (Signed)
The new prescription is for the 10mg  tablets, finish the 20mg  tablets before starting the new prescription. Follow up 3-4 weeks.

## 2017-12-15 NOTE — Telephone Encounter (Signed)
Pt advised.   Thanks,   -Laura  

## 2018-01-04 ENCOUNTER — Other Ambulatory Visit: Payer: Self-pay

## 2018-01-04 DIAGNOSIS — G8929 Other chronic pain: Secondary | ICD-10-CM

## 2018-01-04 DIAGNOSIS — M2550 Pain in unspecified joint: Principal | ICD-10-CM

## 2018-01-04 MED ORDER — OXYCODONE HCL 15 MG PO TABS: ORAL_TABLET | ORAL | 0 refills | Status: DC

## 2018-01-13 ENCOUNTER — Other Ambulatory Visit: Payer: Self-pay | Admitting: Family Medicine

## 2018-01-31 ENCOUNTER — Other Ambulatory Visit: Payer: Self-pay | Admitting: Family Medicine

## 2018-01-31 DIAGNOSIS — G8929 Other chronic pain: Secondary | ICD-10-CM

## 2018-01-31 DIAGNOSIS — M2550 Pain in unspecified joint: Principal | ICD-10-CM

## 2018-01-31 NOTE — Telephone Encounter (Signed)
Pt contacted office for refill request on the following medications:  oxyCODONE (ROXICODONE) 15 MG immediate release tablet  [facility] [address]  Last Rx: 01/04/18 Pt stated he isn't out of the medication but wanted to make sure he requesting in advance so he could get his medication the day it's due. Please advise. Thanks TNP

## 2018-02-01 MED ORDER — OXYCODONE HCL 15 MG PO TABS: ORAL_TABLET | ORAL | 0 refills | Status: DC

## 2018-02-02 ENCOUNTER — Other Ambulatory Visit: Payer: Self-pay | Admitting: Family Medicine

## 2018-02-02 DIAGNOSIS — K7031 Alcoholic cirrhosis of liver with ascites: Secondary | ICD-10-CM

## 2018-02-06 ENCOUNTER — Encounter: Payer: Self-pay | Admitting: Gastroenterology

## 2018-02-06 ENCOUNTER — Ambulatory Visit (INDEPENDENT_AMBULATORY_CARE_PROVIDER_SITE_OTHER): Payer: Medicare Other | Admitting: Gastroenterology

## 2018-02-06 VITALS — BP 101/67 | HR 91 | Ht 72.0 in | Wt 244.8 lb

## 2018-02-06 DIAGNOSIS — K7031 Alcoholic cirrhosis of liver with ascites: Secondary | ICD-10-CM

## 2018-02-06 DIAGNOSIS — K760 Fatty (change of) liver, not elsewhere classified: Secondary | ICD-10-CM

## 2018-02-06 NOTE — Patient Instructions (Addendum)
You are scheduled for an MRI liver w/wo contrast at [facility] on Sunday, September 22nd at 8:00am. Please arrive at 7:30am at the medical mall registration desk. You cannot have anything to eat or drink after midnight.  If you need to reschedule this appointment for any reason, please contact central scheduling at [phone].

## 2018-02-06 NOTE — Progress Notes (Signed)
Gastroenterology Consultation  Referring Provider:     Birdie Sons, MD Primary Care Physician:  Birdie Sons, MD Primary Gastroenterologist:  Dr. Allen Norris     Reason for Consultation:     Alcoholic cirrhosis        HPI:   Andrew Carey is a 67 y.o. y/o male referred for consultation & management of alcoholic cirrhosis by Dr. [name], Kirstie Peri, MD.  This patient comes in today with a long-standing history of alcoholic liver disease.  The patient states that he had always been a big drinker but started to drink heavily after his wife died.  The patient has been seen at [facility] for his cirrhosis in the past and has had a colonoscopy there.  The patient states he is now switching to me since he got very little information from his gastroenterologist at [facility].  He also reports that he had asked to be put on the transplant list but was told by his physician at [facility] that he was not a candidate for a liver transplant.  The patient states that he is actively drinking and states that he drinks 2 glasses of wine at least 3 times a week.  He states that it is the only way he does not go onto hard liquor.  The patient states that he did stop drinking for over 700 days at one point but then felt that nobody was giving him any recognition for stopping drinking so he started again.  Most recent blood alcohol was done in January and was positive.  The patient also had an MRI in the past with a intermittent lesion suspicious for possible cancer but he states he was told it was not cancer and his alpha-fetoprotein has been negative.  Is now coming here to transfer his care to me since he does not want to go back to [facility].  Past Medical History:  Diagnosis Date  . Allergy   . Arthritis   . Cirrhosis ([place])   . Colon polyps   . Colorectal cancer ([place])   . Depression   . Diverticulosis   . Liver lesion     Past Surgical History:  Procedure Laterality Date  .  COLON SURGERY    . HERNIA REPAIR      Prior to Admission medications   Medication Sig Start Date End Date Taking? Authorizing Provider  ALPRAZolam Duanne Moron) 1 MG tablet TAKE 1 TABLET BY MOUTH THREE TIMES DAILY AS NEEDED FOR ANXIETY 11/01/17  Yes Birdie Sons, MD  furosemide (LASIX) 80 MG tablet take 40mg  by mouth once daily 07/06/17  Yes Fisher, Kirstie Peri, MD  lactulose (CHRONULAC) 10 GM/15ML solution TAKE 30 MILLILITERS BY MOUTH THREE TIMES A DAY IF NEEDED 02/02/18  Yes Birdie Sons, MD  oxyCODONE (ROXICODONE) 15 MG immediate release tablet take 1/2 tablets by mouth two to three times a day as needed 02/01/18  Yes Fisher, Kirstie Peri, MD  thiamine (VITAMIN B-1) 100 MG tablet Take 100 mg by mouth daily. 10/03/17  Yes [provider]  ARIPiprazole (ABILIFY) 2 MG tablet Take 1 tablet (2 mg total) by mouth daily. Patient not taking: Reported on 10/10/2017 06/12/17   Marylin Crosby, MD  colchicine 0.6 MG tablet Take 1 tablet (0.6 mg total) by mouth daily. Patient not taking: Reported on 02/06/2018 11/19/17   Birdie Sons, MD  predniSONE (DELTASONE) 10 MG tablet Take one tablet twice a day for 5 days, then one tablet  daily Patient not taking: Reported on 02/06/2018 12/15/17   Birdie Sons, MD  spironolactone (ALDACTONE) 100 MG tablet TAKE 2 TABLETS BY MOUTH ONCE DAILY 01/13/18   Birdie Sons, MD    Family History  Problem Relation Age of Onset  . Heart failure Father   . Congestive Heart Failure Father   . Heart disease Father   . Stroke Father   . Mental illness Father   . Cancer Mother   . Colon polyps Mother   . Colon polyps Sister   . Depression Sister   . Mental illness Sister   . Mental illness Sister   . Depression Sister   . Mental illness Brother   . Depression Brother   . Mental illness Sister   . Depression Sister      Social History   Tobacco Use  . Smoking status: Current Every Day Smoker    Packs/day: 1.00    Years: 50.00    Pack years: 50.00    Types:  Cigarettes  . Smokeless tobacco: Never Used  Substance Use Topics  . Alcohol use: Yes    Frequency: Never    Comment: occasional glass of red wine  . Drug use: No    Allergies as of 02/06/2018 - Review Complete 02/06/2018  Allergen Reaction Noted  . Fluoxetine  03/09/2016  . Codeine Nausea And Vomiting and Nausea Only 11/02/2012  . Trazodone and nefazodone  10/10/2017    Review of Systems:    All systems reviewed and negative except where noted in HPI.   Physical Exam:  BP 101/67   Pulse 91   Ht 6' (1.829 m)   Wt 244 lb 12.8 oz (111 kg)   BMI 33.20 kg/m  No LMP for male patient. General:   Alert,  Well-developed, well-nourished, pleasant and cooperative in NAD Head:  Normocephalic and atraumatic. Eyes:  Sclera clear, no icterus.   Conjunctiva pink. Ears:  Normal auditory acuity. Nose:  No deformity, discharge, or lesions. Mouth:  No deformity or lesions,oropharynx pink & moist. Neck:  Supple; no masses or thyromegaly. Lungs:  Respirations even and unlabored.  Clear throughout to auscultation.   No wheezes, crackles, or rhonchi. No acute distress. Heart:  Regular rate and rhythm; no murmurs, clicks, rubs, or gallops. Abdomen:  Normal bowel sounds.  No bruits.  Soft, non-tender and non-distended without masses, hepatosplenomegaly or hernias noted.  No guarding or rebound tenderness.  Negative Carnett sign.   Rectal:  Deferred.  Msk:  Symmetrical without gross deformities.  Good, equal movement & strength bilaterally. Pulses:  Normal pulses noted. Extremities:  No clubbing or edema.  No cyanosis. Neurologic:  Alert and oriented x3;  grossly normal neurologically. Skin:  Intact without significant lesions or rashes.  No jaundice. Lymph Nodes:  No significant cervical adenopathy. Psych:  Alert and cooperative. Normal mood and affect.  Imaging Studies: No results found.  Assessment and Plan:   Andrew Carey is a 67 y.o. y/o male with an extensive history of  alcoholic cirrhosis.  The patient is actively drinking although he states he is drinking much less than he has in the past.  The patient had an MRI with a intermittent lesion found in the liver.  The patient will have a repeat MRI of the liver to assess this lesion and make sure there has been no growth.  The patient also has not had any follow-up for hepatocellular carcinoma surveillance. The patient will also have his LFTs rechecked and an INR  checked.  The patient will not be sent for evaluation for liver transplant at this time because of his active drinking and no sign of any further decompensation from his decomp stated baseline.  The patient has been explained the plan and agrees with it.  Lucilla Lame, MD. [facility] Regal    Note: This dictation was prepared with Dragon dictation along with smaller phrase technology. Any transcriptional errors that result from this process are unintentional.

## 2018-02-07 LAB — CBC WITH DIFFERENTIAL/PLATELET
BASOS ABS: 0 10*3/uL (ref 0.0–0.2)
Basos: 0 %
EOS (ABSOLUTE): 0.1 10*3/uL (ref 0.0–0.4)
Eos: 1 %
HEMOGLOBIN: 16.1 g/dL (ref 13.0–17.7)
Hematocrit: 47.4 % (ref 37.5–51.0)
Immature Grans (Abs): 0.1 10*3/uL (ref 0.0–0.1)
Immature Granulocytes: 1 %
LYMPHS ABS: 1.9 10*3/uL (ref 0.7–3.1)
LYMPHS: 20 %
MCH: 31.3 pg (ref 26.6–33.0)
MCHC: 34 g/dL (ref 31.5–35.7)
MCV: 92 fL (ref 79–97)
MONOCYTES: 9 %
Monocytes Absolute: 0.9 10*3/uL (ref 0.1–0.9)
NEUTROS PCT: 69 %
Neutrophils Absolute: 6.6 10*3/uL (ref 1.4–7.0)
Platelets: 225 10*3/uL (ref 150–450)
RBC: 5.14 x10E6/uL (ref 4.14–5.80)
RDW: 13.1 % (ref 12.3–15.4)
WBC: 9.6 10*3/uL (ref 3.4–10.8)

## 2018-02-07 LAB — PROTIME-INR
INR: 1 (ref 0.8–1.2)
Prothrombin Time: 10.1 s (ref 9.1–12.0)

## 2018-02-07 LAB — AFP TUMOR MARKER: AFP, SERUM, TUMOR MARKER: 1.5 ng/mL (ref 0.0–8.3)

## 2018-02-08 ENCOUNTER — Telehealth: Payer: Self-pay | Admitting: Gastroenterology

## 2018-02-08 NOTE — Telephone Encounter (Signed)
Pt would like a call regarding his MRI apt

## 2018-02-09 ENCOUNTER — Telehealth: Payer: Self-pay

## 2018-02-09 NOTE — Telephone Encounter (Signed)
-----   Message from Lucilla Lame, MD sent at 02/08/2018 10:20 AM EDT ----- Let the patient know that the blood test for her cancer was negative.

## 2018-02-09 NOTE — Telephone Encounter (Signed)
Pt notified of lab results

## 2018-02-09 NOTE — Telephone Encounter (Signed)
Pt lost appt information on MRI. Pt advised MRI is scheduled for Sunday, Sept 22nd at 8:00am. Pt has been advised to arrive at 7:30am and to be NPO after midnight. Pt verbalized understanding.

## 2018-02-12 ENCOUNTER — Ambulatory Visit: Payer: Self-pay | Admitting: Family Medicine

## 2018-02-16 ENCOUNTER — Other Ambulatory Visit: Payer: Self-pay | Admitting: Family Medicine

## 2018-02-16 DIAGNOSIS — M2550 Pain in unspecified joint: Principal | ICD-10-CM

## 2018-02-16 DIAGNOSIS — G8929 Other chronic pain: Secondary | ICD-10-CM

## 2018-02-16 MED ORDER — OXYCODONE HCL 15 MG PO TABS: ORAL_TABLET | ORAL | 0 refills | Status: DC

## 2018-02-16 NOTE — Telephone Encounter (Signed)
Pt needing a refill on: ° °oxyCODONE (ROXICODONE) 15 MG immediate release tablet ° °Please fill at: ° °[id number] - [place], [place] - [address] [phone] (Phone) °[phone] (Fax)  ° ° °Thanks, °[facility] °

## 2018-02-18 ENCOUNTER — Ambulatory Visit: Payer: Medicare Other

## 2018-02-23 ENCOUNTER — Other Ambulatory Visit: Payer: Self-pay | Admitting: Family Medicine

## 2018-02-23 MED ORDER — ALPRAZOLAM 1 MG PO TABS: 1.0000 mg | ORAL_TABLET | Freq: Three times a day (TID) | ORAL | 5 refills | Status: DC | PRN

## 2018-02-23 NOTE — Telephone Encounter (Signed)
Pt needs a new refill on   Alprazolam 1 mg 3 times a day  He uses [facility] and [address]  He will be out on Oct 1.  He said he always has a hard time getting this rx on the day he needs it.  Pt's Call Back is [phone]  Thanks Con Memos

## 2018-03-09 ENCOUNTER — Other Ambulatory Visit: Payer: Self-pay | Admitting: Family Medicine

## 2018-03-09 DIAGNOSIS — M2550 Pain in unspecified joint: Principal | ICD-10-CM

## 2018-03-09 DIAGNOSIS — G8929 Other chronic pain: Secondary | ICD-10-CM

## 2018-03-09 MED ORDER — OXYCODONE HCL 15 MG PO TABS: ORAL_TABLET | ORAL | 0 refills | Status: DC

## 2018-03-09 NOTE — Telephone Encounter (Signed)
Pt contacted office for refill request on the following medications:  oxyCODONE (ROXICODONE) 15 MG immediate release tablet   [facility] [address]  Last Rx: 02/16/18 LOV: 12/11/17 Please advise. Thanks TNP

## 2018-03-26 ENCOUNTER — Other Ambulatory Visit: Payer: Self-pay | Admitting: Family Medicine

## 2018-03-26 DIAGNOSIS — G8929 Other chronic pain: Secondary | ICD-10-CM

## 2018-03-26 DIAGNOSIS — M2550 Pain in unspecified joint: Principal | ICD-10-CM

## 2018-03-26 MED ORDER — OXYCODONE HCL 15 MG PO TABS: ORAL_TABLET | ORAL | 0 refills | Status: DC

## 2018-03-26 NOTE — Telephone Encounter (Signed)
Pt needing a refill due on 03-28-18 on:  oxyCODONE (ROXICODONE) 15 MG immediate release tablet   Please fill at:  [id number] - [place], [place] - [place] [phone] (Phone) [phone] (Fax)    Thanks, [facility]

## 2018-03-27 ENCOUNTER — Telehealth: Payer: Self-pay | Admitting: Family Medicine

## 2018-03-27 NOTE — Telephone Encounter (Signed)
I called the patient to schedule AWV with Andrew Carey.  There was no answer and no option to leave a message. Last AWV 10/01/12 VDM (DD)

## 2018-04-08 ENCOUNTER — Other Ambulatory Visit: Payer: Self-pay | Admitting: Family Medicine

## 2018-04-13 ENCOUNTER — Ambulatory Visit: Payer: Self-pay | Admitting: Family Medicine

## 2018-04-16 ENCOUNTER — Other Ambulatory Visit: Payer: Self-pay | Admitting: Family Medicine

## 2018-04-16 DIAGNOSIS — G8929 Other chronic pain: Secondary | ICD-10-CM

## 2018-04-16 DIAGNOSIS — M2550 Pain in unspecified joint: Principal | ICD-10-CM

## 2018-04-16 MED ORDER — OXYCODONE HCL 15 MG PO TABS: ORAL_TABLET | ORAL | 0 refills | Status: DC

## 2018-04-16 NOTE — Telephone Encounter (Signed)
Pt contacted office for refill request on the following medications:  oxyCODONE (ROXICODONE) 15 MG immediate release tablet  [facility] [address] [facility]  Last Rx: 03/26/18 LOV: 12/11/17 NOV: 04/18/18 Please advise. Thanks TNP

## 2018-04-18 ENCOUNTER — Ambulatory Visit (INDEPENDENT_AMBULATORY_CARE_PROVIDER_SITE_OTHER): Payer: Medicare Other | Admitting: Family Medicine

## 2018-04-18 ENCOUNTER — Encounter: Payer: Self-pay | Admitting: Family Medicine

## 2018-04-18 VITALS — BP 123/80 | HR 92 | Temp 97.8°F | Resp 16 | Wt 243.0 lb

## 2018-04-18 DIAGNOSIS — K7031 Alcoholic cirrhosis of liver with ascites: Secondary | ICD-10-CM | POA: Diagnosis not present

## 2018-04-18 DIAGNOSIS — F1010 Alcohol abuse, uncomplicated: Secondary | ICD-10-CM

## 2018-04-18 DIAGNOSIS — M2550 Pain in unspecified joint: Secondary | ICD-10-CM

## 2018-04-18 DIAGNOSIS — G8929 Other chronic pain: Secondary | ICD-10-CM

## 2018-04-18 DIAGNOSIS — F410 Panic disorder [episodic paroxysmal anxiety] without agoraphobia: Secondary | ICD-10-CM

## 2018-04-18 DIAGNOSIS — F332 Major depressive disorder, recurrent severe without psychotic features: Secondary | ICD-10-CM

## 2018-04-18 DIAGNOSIS — Z23 Encounter for immunization: Secondary | ICD-10-CM

## 2018-04-18 MED ORDER — FUROSEMIDE 40 MG PO TABS: 40.0000 mg | ORAL_TABLET | Freq: Every day | ORAL | 5 refills | Status: AC

## 2018-04-18 MED ORDER — VILAZODONE HCL 10 & 20 MG PO KIT: 0.5000 mg | PACK | Freq: Every day | ORAL | 0 refills | Status: DC

## 2018-04-18 NOTE — Patient Instructions (Signed)
   Do not drink alcohol at all

## 2018-04-18 NOTE — Progress Notes (Signed)
Patient: Andrew Carey Male    DOB: 02/02/1951   67 y.o.   MRN: [id number] Visit Date: 04/18/2018  Today's Provider: Lelon Huh, MD   Chief Complaint  Patient presents with  . Follow-up   Subjective:    HPI Adjustment disorder with mixed anxiety and depressed mood: Patient was last seen for this problem 6 months ago and no changes were made. Patient was advised to continue regular visits with counselor. Patient reports he has been following up with his counselor. He is currently in search of another therapist. He states he doesn't feel like he was getting personalized attention with his current therapist, and they weren't understanding of his problems. He states he really wishes there was a medication he could tolerated that would help with depression. He has tried multiple SSRIs and SSNRs and Abilify which he did not tolerate. He also tried mirtazapine which he doesn't remember why he stopped, but thinks maybe it wasn't helping. He had been seeing a few counselors but didn't feel they were helping, so he stopped going. He states he is looking into working with a Company secretary.   Alcoholic cirrhosis of liver with ascites: Patient was last seen for this problem 4 months ago. During that visit, patient was counseled not to take NSAIDS. Patient reports fair compliance with this recommendation.   Chronic Joint Pain: Patient was last seen for this problem 6 months ago and no changes were made. Patient reports good compliance with treatment, good tolerance and fair symptom control.   He states oxycodone remains effective and he would not be able to get up and around and do ADLs without taking it. He does feel very anxious when he gets up in the morning, but feels much more calf after taking alprazolam. He usually takes a second one in the afternoon.     Allergies  Allergen Reactions  . Fluoxetine     Hallucinations  . Codeine Nausea And Vomiting and Nausea Only  . Trazodone  And Nefazodone     Vivid dreams     Current Outpatient Medications:  .  ALPRAZolam (XANAX) 1 MG tablet, Take 1 tablet (1 mg total) by mouth 3 (three) times daily as needed. for anxiety, Disp: 90 tablet, Rfl: 5 .  furosemide (LASIX) 80 MG tablet, TAKE 1/2 TABLET BY MOUTH EVERY DAY, Disp: 30 tablet, Rfl: 11 .  lactulose (CHRONULAC) 10 GM/15ML solution, TAKE 30 MILLILITERS BY MOUTH THREE TIMES A DAY IF NEEDED, Disp: 1800 mL, Rfl: 4 .  oxyCODONE (ROXICODONE) 15 MG immediate release tablet, take 1/2 tablets by mouth two to three times a day as needed, Disp: 30 tablet, Rfl: 0 .  spironolactone (ALDACTONE) 100 MG tablet, TAKE 2 TABLETS BY MOUTH ONCE DAILY, Disp: 180 tablet, Rfl: 2 .  thiamine (VITAMIN B-1) 100 MG tablet, Take 100 mg by mouth daily., Disp: , Rfl: 2 .  ARIPiprazole (ABILIFY) 2 MG tablet, Take 1 tablet (2 mg total) by mouth daily. (Patient not taking: Reported on 10/10/2017), Disp: 30 tablet, Rfl: 0 .  colchicine 0.6 MG tablet, Take 1 tablet (0.6 mg total) by mouth daily. (Patient not taking: Reported on 02/06/2018), Disp: 30 tablet, Rfl: 0 .  predniSONE (DELTASONE) 10 MG tablet, Take one tablet twice a day for 5 days, then one tablet daily (Patient not taking: Reported on 02/06/2018), Disp: 30 tablet, Rfl: 0  Review of Systems  Constitutional: Negative for appetite change, chills and fever.  Respiratory: Negative for chest tightness,  shortness of breath and wheezing.   Cardiovascular: Negative for chest pain and palpitations.  Gastrointestinal: Negative for abdominal pain, nausea and vomiting.  Musculoskeletal: Positive for arthralgias.    Social History   Tobacco Use  . Smoking status: Current Every Day Smoker    Packs/day: 1.00    Years: 50.00    Pack years: 50.00    Types: Cigarettes  . Smokeless tobacco: Never Used  Substance Use Topics  . Alcohol use: Yes    Frequency: Never    Comment: occasional glass of red wine   Objective:   BP 123/80 (BP Location: Left Arm,  Patient Position: Sitting, Cuff Size: Large)   Pulse 92   Temp 97.8 F (36.6 C) (Oral)   Resp 16   Wt 243 lb (110.2 kg)   SpO2 96% Comment: room air  BMI 32.96 kg/m  Vitals:   04/18/18 0948  BP: 123/80  Pulse: 92  Resp: 16  Temp: 97.8 F (36.6 C)  TempSrc: Oral  SpO2: 96%  Weight: 243 lb (110.2 kg)     Physical Exam   General Appearance:    Alert, cooperative, no distress  Eyes:    PERRL, conjunctiva/corneas clear, EOM's intact       Lungs:     Clear to auscultation bilaterally, respirations unlabored  Heart:    Regular rate and rhythm  Neurologic:   Awake, alert, oriented x 3. No apparent focal neurological           defect.           Assessment & Plan:     1. Severe recurrent major depression without psychotic features ([place]) Failed multiple antidepressants. Is willing to keep trying new medication. Given start pack fo Vybriid, but is to only take 1/2 tablet. Will check in with him in a few weeks to see how he is tolerating it.   - Vilazodone HCl (VIIBRYD STARTER PACK) 10 & 20 MG KIT; Take 0.5 mg by mouth daily.  Dispense: 1 kit; Refill: 0  2. Panic disorder Continue low dose alprazolam.   3. Alcohol abuse Advised to abstain completely from alcohol consumption.   4. Alcoholic cirrhosis of liver with ascites ([facility]) Stable. Continue regular follow up with GI.  - furosemide (LASIX) 40 MG tablet; Take 1 tablet (40 mg total) by mouth daily.  Dispense: 60 tablet; Refill: 5  5. Chronic joint pain Pain adequately controlled allowing him to be functional. Pain contract signed today. Continue current medications.    6. Need for pneumococcal vaccination  - Pneumococcal conjugate vaccine 13-valent IM  7. Need for influenza vaccination  - Flu vaccine HIGH DOSE PF       Lelon Huh, MD  [facility]

## 2018-05-03 ENCOUNTER — Other Ambulatory Visit: Payer: Self-pay | Admitting: Family Medicine

## 2018-05-03 DIAGNOSIS — M2550 Pain in unspecified joint: Principal | ICD-10-CM

## 2018-05-03 DIAGNOSIS — G8929 Other chronic pain: Secondary | ICD-10-CM

## 2018-05-03 MED ORDER — OXYCODONE HCL 15 MG PO TABS: ORAL_TABLET | ORAL | 0 refills | Status: DC

## 2018-05-03 NOTE — Telephone Encounter (Signed)
Pt needing a refill on:  oxyCODONE (ROXICODONE) 15 MG immediate release tablet  Please fill at:  [id number] - [place], [place] - [place] [phone] (Phone) [phone] (Fax)    Thanks, [facility]

## 2018-05-14 ENCOUNTER — Telehealth: Payer: Self-pay | Admitting: Family Medicine

## 2018-05-14 DIAGNOSIS — F4323 Adjustment disorder with mixed anxiety and depressed mood: Secondary | ICD-10-CM

## 2018-05-14 DIAGNOSIS — F410 Panic disorder [episodic paroxysmal anxiety] without agoraphobia: Secondary | ICD-10-CM

## 2018-05-14 DIAGNOSIS — F332 Major depressive disorder, recurrent severe without psychotic features: Secondary | ICD-10-CM

## 2018-05-14 NOTE — Telephone Encounter (Signed)
Patient was given samples Viibryd to try for depression last month. Please see how he is doing with this prescription. We can send in prescription if it is helping.

## 2018-05-14 NOTE — Telephone Encounter (Signed)
LMTCB 05/14/2018  Thanks,   -Mickel Baas

## 2018-05-15 NOTE — Telephone Encounter (Signed)
Tried calling; no answer.   Thanks,   -Fynlee Rowlands  

## 2018-05-18 NOTE — Telephone Encounter (Signed)
Patient was advised he states that medication is causing his symptoms of anxiety to be worse. Patient is asking if we can prescribe something else to treat symptoms. [facility] [address]

## 2018-05-21 ENCOUNTER — Ambulatory Visit: Payer: Self-pay | Admitting: Family Medicine

## 2018-05-21 NOTE — Telephone Encounter (Signed)
Not that I know of. We can refer him to a psychiatrist to see if he likes.

## 2018-05-21 NOTE — Telephone Encounter (Signed)
Spoke with patient and scheduled for 06/04/2018

## 2018-05-21 NOTE — Telephone Encounter (Signed)
Patient advised and agree's to psychiatry referral. Patient says this may be the best thing for him right now because he is having a hard time with handling is terminal condition. Order placed, please schedule appointment.

## 2018-05-22 ENCOUNTER — Other Ambulatory Visit: Payer: Self-pay

## 2018-05-22 ENCOUNTER — Telehealth: Payer: Self-pay

## 2018-05-22 DIAGNOSIS — G8929 Other chronic pain: Secondary | ICD-10-CM

## 2018-05-22 DIAGNOSIS — M2550 Pain in unspecified joint: Principal | ICD-10-CM

## 2018-05-22 MED ORDER — OXYCODONE HCL 15 MG PO TABS: ORAL_TABLET | ORAL | 0 refills | Status: DC

## 2018-05-22 NOTE — Telephone Encounter (Signed)
Patient called office wanting refill on Oxycodone. KW

## 2018-05-22 NOTE — Telephone Encounter (Signed)
Patient calling that he wants his appointment with the Psychiatrist cancel. Patient was also scheduled for AWV on 01/06 and canceled.

## 2018-05-24 ENCOUNTER — Telehealth: Payer: Self-pay | Admitting: Family Medicine

## 2018-05-24 NOTE — Telephone Encounter (Deleted)
Ok for hospice 

## 2018-05-24 NOTE — Telephone Encounter (Signed)
[name] [phone]  Needing to discuss hospice or needing services in home for pt.  Please call Randall Hiss back to discuss.  Thanks, [employer]

## 2018-05-25 NOTE — Telephone Encounter (Signed)
Please review. Thanks!  

## 2018-05-25 NOTE — Telephone Encounter (Signed)
[name]  Hospice [phone]   Needing an ok by Dr. [name]  to do an  evaluation on pt.  Fax:  [phone]  Please advise.  Thanks, [employer]

## 2018-05-25 NOTE — Telephone Encounter (Signed)
OK for hospice eval.

## 2018-05-25 NOTE — Telephone Encounter (Signed)
Pt wanting to let Dr. [name] know that he wants to keep him as his PCP.   Pt is only wanting care for home - light house cleaning - transportation to dr visit - and some counseling.   Thanks, [employer]

## 2018-05-25 NOTE — Telephone Encounter (Signed)
Left message for Randall Hiss as below.

## 2018-06-04 ENCOUNTER — Ambulatory Visit: Payer: Self-pay

## 2018-06-14 ENCOUNTER — Other Ambulatory Visit: Payer: Self-pay | Admitting: Family Medicine

## 2018-06-14 DIAGNOSIS — G8929 Other chronic pain: Secondary | ICD-10-CM

## 2018-06-14 DIAGNOSIS — M2550 Pain in unspecified joint: Principal | ICD-10-CM

## 2018-06-14 MED ORDER — OXYCODONE HCL 15 MG PO TABS: ORAL_TABLET | ORAL | 0 refills | Status: DC

## 2018-06-14 NOTE — Telephone Encounter (Signed)
Pt states he needs refill of oxyCODONE 15 MG immediate release tablet [facility] [address]

## 2018-06-20 ENCOUNTER — Telehealth: Payer: Self-pay | Admitting: Family Medicine

## 2018-06-20 NOTE — Telephone Encounter (Signed)
Opened in error.  [facility]

## 2018-07-02 ENCOUNTER — Other Ambulatory Visit: Payer: Self-pay | Admitting: Family Medicine

## 2018-07-02 DIAGNOSIS — G8929 Other chronic pain: Secondary | ICD-10-CM

## 2018-07-02 DIAGNOSIS — M2550 Pain in unspecified joint: Principal | ICD-10-CM

## 2018-07-02 MED ORDER — OXYCODONE HCL 15 MG PO TABS: ORAL_TABLET | ORAL | 0 refills | Status: DC

## 2018-07-02 NOTE — Telephone Encounter (Signed)
Patient needs refill of Oxycodone 15 mg.  [facility] [address]

## 2018-07-20 ENCOUNTER — Other Ambulatory Visit: Payer: Self-pay

## 2018-07-20 DIAGNOSIS — G8929 Other chronic pain: Secondary | ICD-10-CM

## 2018-07-20 DIAGNOSIS — M2550 Pain in unspecified joint: Principal | ICD-10-CM

## 2018-07-20 MED ORDER — OXYCODONE HCL 15 MG PO TABS: ORAL_TABLET | ORAL | 0 refills | Status: DC

## 2018-07-20 NOTE — Telephone Encounter (Signed)
Patient requesting refills. Thanks!  

## 2018-08-06 ENCOUNTER — Other Ambulatory Visit: Payer: Self-pay | Admitting: Family Medicine

## 2018-08-06 DIAGNOSIS — M2550 Pain in unspecified joint: Principal | ICD-10-CM

## 2018-08-06 DIAGNOSIS — G8929 Other chronic pain: Secondary | ICD-10-CM

## 2018-08-06 MED ORDER — OXYCODONE HCL 15 MG PO TABS: ORAL_TABLET | ORAL | 0 refills | Status: DC

## 2018-08-06 NOTE — Telephone Encounter (Signed)
Pt needing refills on:  oxyCODONE (ROXICODONE) 15 MG immediate release tablet ALPRAZolam (XANAX) 1 MG tablet - out of refills  [facility] at[id number] [place], [place] [phone] (Phone) [phone] (Fax)   Thanks, [employer]

## 2018-08-24 ENCOUNTER — Other Ambulatory Visit: Payer: Self-pay | Admitting: *Deleted

## 2018-08-24 DIAGNOSIS — G8929 Other chronic pain: Secondary | ICD-10-CM

## 2018-08-24 DIAGNOSIS — M2550 Pain in unspecified joint: Principal | ICD-10-CM

## 2018-08-24 NOTE — Telephone Encounter (Signed)
L.O.V. 04/18/2018,please advise.

## 2018-08-25 MED ORDER — OXYCODONE HCL 15 MG PO TABS: ORAL_TABLET | ORAL | 0 refills | Status: DC

## 2018-09-12 ENCOUNTER — Other Ambulatory Visit: Payer: Self-pay | Admitting: Family Medicine

## 2018-09-12 DIAGNOSIS — G8929 Other chronic pain: Secondary | ICD-10-CM

## 2018-09-12 DIAGNOSIS — M2550 Pain in unspecified joint: Principal | ICD-10-CM

## 2018-09-12 MED ORDER — OXYCODONE HCL 15 MG PO TABS: ORAL_TABLET | ORAL | 0 refills | Status: DC

## 2018-09-12 NOTE — Telephone Encounter (Signed)
After hours services requested refill  for the following medications:  oxyCODONE (ROXICODONE) 15 MG immediate release tablet  Did not specify pharmacy.  Thanks, [employer]

## 2018-09-18 ENCOUNTER — Telehealth: Payer: Self-pay

## 2018-09-18 NOTE — Telephone Encounter (Signed)
Multiple attempts to reach patient via phone to schedule follow up palliative care visits, no  voicemail set up. Pt not answering phone. RN reached out to MD to notify, will plan to discharge from palliative care outpatient

## 2018-10-01 ENCOUNTER — Other Ambulatory Visit: Payer: Self-pay

## 2018-10-01 DIAGNOSIS — G8929 Other chronic pain: Secondary | ICD-10-CM

## 2018-10-01 DIAGNOSIS — M2550 Pain in unspecified joint: Principal | ICD-10-CM

## 2018-10-01 MED ORDER — OXYCODONE HCL 15 MG PO TABS: ORAL_TABLET | ORAL | 0 refills | Status: DC

## 2018-10-01 NOTE — Telephone Encounter (Signed)
Patient request refill

## 2018-10-18 ENCOUNTER — Other Ambulatory Visit: Payer: Self-pay

## 2018-10-18 DIAGNOSIS — G8929 Other chronic pain: Secondary | ICD-10-CM

## 2018-10-18 DIAGNOSIS — M2550 Pain in unspecified joint: Secondary | ICD-10-CM

## 2018-10-18 MED ORDER — OXYCODONE HCL 15 MG PO TABS: ORAL_TABLET | ORAL | 0 refills | Status: DC

## 2018-10-18 NOTE — Telephone Encounter (Signed)
Patient request refill

## 2018-11-05 ENCOUNTER — Other Ambulatory Visit: Payer: Self-pay | Admitting: Family Medicine

## 2018-11-05 DIAGNOSIS — G8929 Other chronic pain: Secondary | ICD-10-CM

## 2018-11-05 NOTE — Telephone Encounter (Signed)
Pt needs refill for oxycodone 15 mg  Not due until the 10th.  He has enough until then.  [facility]  [address]  [place]

## 2018-11-07 ENCOUNTER — Ambulatory Visit (INDEPENDENT_AMBULATORY_CARE_PROVIDER_SITE_OTHER): Payer: Medicare Other | Admitting: Family Medicine

## 2018-11-07 ENCOUNTER — Other Ambulatory Visit: Payer: Self-pay

## 2018-11-07 DIAGNOSIS — K7031 Alcoholic cirrhosis of liver with ascites: Secondary | ICD-10-CM

## 2018-11-07 DIAGNOSIS — F411 Generalized anxiety disorder: Secondary | ICD-10-CM | POA: Diagnosis not present

## 2018-11-07 DIAGNOSIS — R61 Generalized hyperhidrosis: Secondary | ICD-10-CM | POA: Diagnosis not present

## 2018-11-07 DIAGNOSIS — F410 Panic disorder [episodic paroxysmal anxiety] without agoraphobia: Secondary | ICD-10-CM

## 2018-11-07 MED ORDER — OXYCODONE HCL 15 MG PO TABS: ORAL_TABLET | ORAL | 0 refills | Status: DC

## 2018-11-07 NOTE — Patient Instructions (Signed)
.   Please review the attached list of medications and notify my office if there are any errors.   . Please bring all of your medications to every appointment so we can make sure that our medication list is the same as yours.   

## 2018-11-07 NOTE — Progress Notes (Signed)
Patient: Andrew Carey Male    DOB: [birth year]   68 y.o.   MRN: [id number] Visit Date: 11/07/2018  Today's Provider: Lelon Huh, MD   No chief complaint on file. Virtual Visit via Telephone Note  I connected with@ on 11/07/18 at 11 AM EDT by telephone and verified that I am speaking with the correct person using two identifiers.   I discussed the limitations, risks, security and privacy concerns of performing an evaluation and management service by telephone and the availability of in person appointments. I also discussed with the patient that there may be a patient responsible charge related to this service. The patient expressed understanding and agreed to proceed. I discussed the assessment and treatment plan with the patient. The patient was provided an opportunity to ask questions and all were answered. The patient agreed with the plan and demonstrated an understanding of the instructions. The patient was advised to call back or seek an in-person evaluation if the symptoms worsen or if the condition fails to improve as anticipated.   Patient location: home Provider location: [place] office  Persons involved in the visit: patient, provider    Subjective:     Complains of night sweats, waking up soaked in the middle of the night a few times each times at night for 3-4 weeks. No cough dysuria, changes in BMs, URI sx or other signs of infection. He has had had bouts of increased anxiety and doesn't feel like alprazolam is working as well as usual.   He has chronic back pain but reports he does well with 1/2 tablet of oxycodone 2 or 3 times daily and wishes to continue same regiment. He continues to abstain from drinking alcohol. Is not pursuing liver transplant anymore.   Allergies  Allergen Reactions  . Fluoxetine     Hallucinations  . Codeine Nausea And Vomiting and Nausea Only  . Abilify [Aripiprazole]     Anxiety and panic attacks  .  Trazodone And Nefazodone     Vivid dreams     Current Outpatient Medications:  .  ALPRAZolam (XANAX) 1 MG tablet, TAKE 1 TABLET BY MOUTH 3 TIMES A DAY AS NEEDED FOR ANXIETY, Disp: 90 tablet, Rfl: 3 .  furosemide (LASIX) 40 MG tablet, Take 1 tablet (40 mg total) by mouth daily., Disp: 60 tablet, Rfl: 5 .  lactulose (CHRONULAC) 10 GM/15ML solution, TAKE 30 MILLILITERS BY MOUTH THREE TIMES A DAY IF NEEDED, Disp: 1800 mL, Rfl: 4 .  oxyCODONE (ROXICODONE) 15 MG immediate release tablet, take 1/2 tablets by mouth two to three times a day as needed, Disp: 30 tablet, Rfl: 0 .  spironolactone (ALDACTONE) 100 MG tablet, TAKE 2 TABLETS BY MOUTH ONCE DAILY (Patient taking differently: Take 100-200 mg by mouth daily. TAKE 2 TABLETS BY MOUTH ONCE DAILY), Disp: 180 tablet, Rfl: 2 .  thiamine (VITAMIN B-1) 100 MG tablet, Take 100 mg by mouth daily., Disp: , Rfl: 2 .  Vilazodone HCl (VIIBRYD STARTER PACK) 10 & 20 MG KIT, Take 0.5 mg by mouth daily. (Patient not taking: Reported on 11/07/2018), Disp: 1 kit, Rfl: 0  Review of Systems  Respiratory: Negative.  Negative for cough, shortness of breath and wheezing.   Cardiovascular: Negative for chest pain, palpitations and leg swelling.  Neurological: Negative for weakness and headaches.    Social History   Tobacco Use  . Smoking status: Current Every Day Smoker    Packs/day: 1.00    Years: 50.00  Pack years: 50.00    Types: Cigarettes  . Smokeless tobacco: Never Used  Substance Use Topics  . Alcohol use: Yes    Frequency: Never    Comment: occasional glass of red wine      Objective:   There were no vitals taken for this visit.    Physical Exam  No exam done for telephone visit.     Assessment & Plan    1. Night sweats No sign of infection. Likely secondary to chronic disease. Expect course to wax and want. Let me know if worsens and will order labs. Unable to come to office at this time due to [place] pandemic.   2. Panic disorder    3. GAD (generalized anxiety disorder) Is tolerating 71m alprazolam tid well, but not as effective as it used to be. He can take an extra mg occasionally as needed, but no more than 651min a day. Consider change to 98m3mablets in a few weeks if doing betterl.   4. Alcoholic cirrhosis of liver with ascites (HCC) Continues to abstain from alcohol.       I provided 20  minutes of non-face-to-face time during this encounter. DonLelon HuhD  [facility]

## 2018-11-09 ENCOUNTER — Telehealth: Payer: Self-pay

## 2018-11-09 NOTE — Telephone Encounter (Signed)
Patient called requesting an appointment for a telephone visit some time next week. He wants to discuss changes that were made during the last telephone visit, and he has several more questions that he forgot to ask. There are no openings at 8am or 1:20pm for virtual/ telephone visits for next week. Can we use the blocked 1:20 slot on Monday?  Please advise.

## 2018-11-12 NOTE — Telephone Encounter (Signed)
Can do a telephone call Wednesday at 11:20, but need to block the 11:00am

## 2018-11-12 NOTE — Telephone Encounter (Signed)
Apt 11:20 on 11/14/2018  Thanks,   Mickel Baas

## 2018-11-14 ENCOUNTER — Ambulatory Visit (INDEPENDENT_AMBULATORY_CARE_PROVIDER_SITE_OTHER): Payer: Medicare Other | Admitting: Family Medicine

## 2018-11-14 ENCOUNTER — Other Ambulatory Visit: Payer: Self-pay

## 2018-11-14 DIAGNOSIS — R110 Nausea: Secondary | ICD-10-CM

## 2018-11-14 DIAGNOSIS — F411 Generalized anxiety disorder: Secondary | ICD-10-CM | POA: Diagnosis not present

## 2018-11-14 MED ORDER — ONDANSETRON 4 MG PO TBDP: 2.0000 mg | ORAL_TABLET | Freq: Three times a day (TID) | ORAL | 3 refills | Status: DC | PRN

## 2018-11-14 NOTE — Progress Notes (Signed)
       Patient: Andrew Carey Male    DOB: 1950-06-26   68 y.o.   MRN: [id number] Visit Date: 11/14/2018  Today's Provider: Lelon Huh, MD   Chief Complaint  Patient presents with  . Follow-up   Subjective:          Patient: Andrew Carey Male    DOB: May 07, 1951   68 y.o.   MRN: [id number] Visit Date: 11/21/2018  Today's Provider: Lelon Huh, MD   Virtual Visit via Telephone Note  I connected with@ on 11/21/18 at 11:20 AM EDT by telephone and verified that I am speaking with the correct person using two identifiers.   I discussed the limitations, risks, security and privacy concerns of performing an evaluation and management service by telephone and the availability of in person appointments. I also discussed with the patient that there may be a patient responsible charge related to this service. The patient expressed understanding and agreed to proceed.   Chief Complaint  Patient presents with  . Follow-up   Subjective:    HPI  He states that he thinks taking the 2 lorazepam is working much better and would like to change to 2mg  tablets with next refill  He also states he gets extremely nauseated when he takes lactulose and sometimes skips doses due to nausea. He has taken Zofran in the past and requests prescription to take to help with nausea.       Assessment & Plan    1. Nausea Prescription - ondansetron (ZOFRAN ODT) 4 MG disintegrating tablet; Take 0.5-1 tablets (2-4 mg total) by mouth every 8 (eight) hours as needed for nausea or vomiting.  Dispense: 60 tablet; Refill: 3  2. GAD (generalized anxiety disorder) Continue current medications.  Will change lorazepam to 2mg  tablets when due for refill.      I discussed the assessment and treatment plan with the patient. The patient was provided an opportunity to ask questions and all were answered. The patient agreed with the plan and demonstrated an understanding of the instructions. The  patient was advised to call back or seek an in-person evaluation if the symptoms worsen or if the condition fails to improve as anticipated.  I provided 8 minutes of non-face-to-face time during this encounter.  Lelon Huh, MD  [facility]

## 2018-11-20 ENCOUNTER — Other Ambulatory Visit: Payer: Self-pay | Admitting: Family Medicine

## 2018-11-20 MED ORDER — ALPRAZOLAM 2 MG PO TABS: 1.0000 mg | ORAL_TABLET | Freq: Three times a day (TID) | ORAL | 3 refills | Status: DC | PRN

## 2018-11-20 NOTE — Telephone Encounter (Signed)
Pt contacted office for refill request on the following medications:  ALPRAZolam (XANAX) 1 MG tablet  [facility] [address] [facility]  Pt stated that when he had his telephone visit with Dr. [name] his dosage was increased from 1 tablet to 2 tablets. Pt stated he has 2 days of medication left. Please advise. Thanks TNP

## 2018-11-21 NOTE — Patient Instructions (Signed)
.   Please review the attached list of medications and notify my office if there are any errors.   . Please bring all of your medications to every appointment so we can make sure that our medication list is the same as yours.   

## 2018-11-22 ENCOUNTER — Telehealth: Payer: Self-pay

## 2018-11-22 NOTE — Telephone Encounter (Signed)
Patient called with a question about his Alprazolam. He says the last prescription was written for a qty of 60. He says is he takes the max dose written as 1 tablet 3 times a day, he would run out of medication. Patient wants to know if this should have been qty of 90 tablets.

## 2018-11-22 NOTE — Telephone Encounter (Signed)
Its written for 60 since we don't expect him to take a full tablet every dose. We can increase quantity with next refill if it ends up not lasting a month, but he will be able to get refill in 20 days if he needs to.

## 2018-11-23 ENCOUNTER — Telehealth: Payer: Self-pay

## 2018-11-23 DIAGNOSIS — G8929 Other chronic pain: Secondary | ICD-10-CM

## 2018-11-23 NOTE — Telephone Encounter (Signed)
Patient advised as directed below. 

## 2018-11-23 NOTE — Telephone Encounter (Signed)
Patient requesting refill on the following medication: oxyCODONE (ROXICODONE) 15 MG immediate release tablet  Patient reports that he is aware the filled day is the 29th but since we have the 48 hours that he is just calling ahead a time.  Pharmacy: [facility]

## 2018-11-25 MED ORDER — OXYCODONE HCL 15 MG PO TABS: ORAL_TABLET | ORAL | 0 refills | Status: DC

## 2018-12-13 ENCOUNTER — Other Ambulatory Visit: Payer: Self-pay

## 2018-12-13 DIAGNOSIS — G8929 Other chronic pain: Secondary | ICD-10-CM

## 2018-12-14 MED ORDER — OXYCODONE HCL 15 MG PO TABS: ORAL_TABLET | ORAL | 0 refills | Status: DC

## 2018-12-14 NOTE — Telephone Encounter (Signed)
Patient called again checking on his refill requested for his Oxycodone.Please advise.

## 2018-12-31 ENCOUNTER — Other Ambulatory Visit: Payer: Self-pay | Admitting: Family Medicine

## 2018-12-31 DIAGNOSIS — G8929 Other chronic pain: Secondary | ICD-10-CM

## 2018-12-31 DIAGNOSIS — M2550 Pain in unspecified joint: Secondary | ICD-10-CM

## 2018-12-31 MED ORDER — OXYCODONE HCL 15 MG PO TABS: ORAL_TABLET | ORAL | 0 refills | Status: DC

## 2018-12-31 NOTE — Telephone Encounter (Signed)
Pt needing a refill:  oxyCODONE (ROXICODONE) 15 MG immediate release tablet  Please fill at:  [id number] [address], [place] [phone] (Phone) [phone] (Fax)   Thanks, [employer]

## 2019-01-15 ENCOUNTER — Ambulatory Visit (INDEPENDENT_AMBULATORY_CARE_PROVIDER_SITE_OTHER): Payer: Medicare Other | Admitting: Family Medicine

## 2019-01-15 ENCOUNTER — Other Ambulatory Visit: Payer: Self-pay

## 2019-01-15 ENCOUNTER — Ambulatory Visit: Payer: Medicare Other | Admitting: Family Medicine

## 2019-01-15 DIAGNOSIS — G629 Polyneuropathy, unspecified: Secondary | ICD-10-CM

## 2019-01-15 MED ORDER — GABAPENTIN 300 MG PO CAPS: ORAL_CAPSULE | ORAL | 3 refills | Status: AC

## 2019-01-15 NOTE — Progress Notes (Signed)
Patient: Andrew Carey Male    DOB: 1950/07/26   68 y.o.   MRN: [id number] Visit Date: 01/15/2019  Today's Provider: Lelon Huh, MD   Chief Complaint  Patient presents with  . Foot Pain   Subjective:    Virtual Visit via Video Note  I connected with [name] on 01/15/19 at 11:20 AM EDT by telephone  and verified that I am speaking with the correct person using two identifiers.   I discussed the limitations of evaluation and management by telemedicine and the availability of in person appointments. The patient expressed understanding and agreed to proceed.    Foot Pain This is a new problem. The current episode started more than 1 month ago (Pt states it started about 2 1/2 months ago. ). The problem has been gradually worsening (Especially in the last two weeks.  Right worse than left.  Unable to put weight on right foot. ). Associated symptoms include arthralgias and numbness. Pertinent negatives include no joint swelling, myalgias or neck pain. The symptoms are aggravated by walking and standing.  Started soles of feet are numb with pain going up right ankle and going around side and ankle, not so much the arch areas. Mild swelling off and on. No redness or purpora.   Allergies  Allergen Reactions  . Fluoxetine     Hallucinations  . Codeine Nausea And Vomiting and Nausea Only  . Abilify [Aripiprazole]     Anxiety and panic attacks  . Trazodone And Nefazodone     Vivid dreams     Current Outpatient Medications:  .  ALPRAZolam (XANAX) 2 MG tablet, Take 0.5-1 tablets (1-2 mg total) by mouth 3 (three) times daily as needed for anxiety., Disp: 60 tablet, Rfl: 3 .  furosemide (LASIX) 40 MG tablet, Take 1 tablet (40 mg total) by mouth daily., Disp: 60 tablet, Rfl: 5 .  lactulose (CHRONULAC) 10 GM/15ML solution, TAKE 30 MILLILITERS BY MOUTH THREE TIMES A DAY IF NEEDED, Disp: 1800 mL, Rfl: 4 .  ondansetron (ZOFRAN ODT) 4 MG disintegrating tablet, Take  0.5-1 tablets (2-4 mg total) by mouth every 8 (eight) hours as needed for nausea or vomiting., Disp: 60 tablet, Rfl: 3 .  oxyCODONE (ROXICODONE) 15 MG immediate release tablet, take 1/2 tablets by mouth two to three times a day as needed, Disp: 30 tablet, Rfl: 0 .  spironolactone (ALDACTONE) 100 MG tablet, TAKE 2 TABLETS BY MOUTH ONCE DAILY (Patient taking differently: Take 100-200 mg by mouth daily. ), Disp: 180 tablet, Rfl: 2 .  thiamine (VITAMIN B-1) 100 MG tablet, Take 100 mg by mouth daily., Disp: , Rfl: 2 .  vitamin B-12 (CYANOCOBALAMIN) 100 MCG tablet, Take 100 mcg by mouth daily., Disp: , Rfl:   Review of Systems  Musculoskeletal: Positive for arthralgias. Negative for back pain, gait problem, joint swelling, myalgias, neck pain and neck stiffness.  Neurological: Positive for numbness.    Social History   Tobacco Use  . Smoking status: Current Every Day Smoker    Packs/day: 1.00    Years: 50.00    Pack years: 50.00    Types: Cigarettes  . Smokeless tobacco: Never Used  Substance Use Topics  . Alcohol use: Yes    Frequency: Never    Comment: occasional glass of red wine      Objective:   There were no vitals taken for this visit.    Physical Exam  Awake, alert, oriented x 3. Exam limited as  visit was via telephone with no video.      Assessment & Plan    1. Neuropathy start - gabapentin (NEURONTIN) 300 MG capsule; One tablet at night for 3 nights, then one tablet twice daily for nerve pain in feet  Dispense: 60 capsule; Refill: 3  Other orders - vitamin B-12 (CYANOCOBALAMIN) 100 MCG tablet; Take 100 mcg by mouth daily.    I discussed the assessment and treatment plan with the patient. The patient was provided an opportunity to ask questions and all were answered. The patient agreed with the plan and demonstrated an understanding of the instructions.   The patient was advised to call back or seek an in-person evaluation if the symptoms worsen or if the condition  fails to improve as anticipated.  I provided 12 minutes of non-face-to-face time during this encounter.  The entirety of the information documented in the History of Present Illness, Review of Systems and Physical Exam were personally obtained by me. Portions of this information were initially documented by Ashley Royalty, CMA and reviewed by me for thoroughness and accuracy.       Lelon Huh, MD  [facility]

## 2019-01-15 NOTE — Patient Instructions (Signed)
.   Please review the attached list of medications and notify my office if there are any errors.   . Please bring all of your medications to every appointment so we can make sure that our medication list is the same as yours.   . We will have flu vaccines available after Labor Day. Please go to your pharmacy or call the office in early September to schedule you flu shot.   

## 2019-01-20 ENCOUNTER — Inpatient Hospital Stay
Admission: EM | Admit: 2019-01-20 | Discharge: 2019-01-25 | DRG: 291 | Disposition: A | Discharge status: Skilled Nursing Facility | Payer: Medicare Other | Attending: Specialist | Admitting: Specialist

## 2019-01-20 ENCOUNTER — Emergency Department: Payer: Medicare Other

## 2019-01-20 ENCOUNTER — Other Ambulatory Visit: Payer: Self-pay

## 2019-01-20 DIAGNOSIS — Z8371 Family history of colonic polyps: Secondary | ICD-10-CM | POA: Diagnosis not present

## 2019-01-20 DIAGNOSIS — M1990 Unspecified osteoarthritis, unspecified site: Secondary | ICD-10-CM | POA: Diagnosis present

## 2019-01-20 DIAGNOSIS — S0990XA Unspecified injury of head, initial encounter: Secondary | ICD-10-CM | POA: Diagnosis present

## 2019-01-20 DIAGNOSIS — I426 Alcoholic cardiomyopathy: Secondary | ICD-10-CM | POA: Diagnosis present

## 2019-01-20 DIAGNOSIS — Z20828 Contact with and (suspected) exposure to other viral communicable diseases: Secondary | ICD-10-CM | POA: Diagnosis present

## 2019-01-20 DIAGNOSIS — J449 Chronic obstructive pulmonary disease, unspecified: Secondary | ICD-10-CM | POA: Diagnosis not present

## 2019-01-20 DIAGNOSIS — R262 Difficulty in walking, not elsewhere classified: Secondary | ICD-10-CM | POA: Diagnosis not present

## 2019-01-20 DIAGNOSIS — K7031 Alcoholic cirrhosis of liver with ascites: Secondary | ICD-10-CM

## 2019-01-20 DIAGNOSIS — R0989 Other specified symptoms and signs involving the circulatory and respiratory systems: Secondary | ICD-10-CM | POA: Diagnosis not present

## 2019-01-20 DIAGNOSIS — R0902 Hypoxemia: Secondary | ICD-10-CM

## 2019-01-20 DIAGNOSIS — J9610 Chronic respiratory failure, unspecified whether with hypoxia or hypercapnia: Secondary | ICD-10-CM | POA: Diagnosis not present

## 2019-01-20 DIAGNOSIS — I493 Ventricular premature depolarization: Secondary | ICD-10-CM | POA: Diagnosis not present

## 2019-01-20 DIAGNOSIS — I5021 Acute systolic (congestive) heart failure: Principal | ICD-10-CM | POA: Diagnosis present

## 2019-01-20 DIAGNOSIS — Z66 Do not resuscitate: Secondary | ICD-10-CM | POA: Diagnosis not present

## 2019-01-20 DIAGNOSIS — F17210 Nicotine dependence, cigarettes, uncomplicated: Secondary | ICD-10-CM | POA: Diagnosis present

## 2019-01-20 DIAGNOSIS — J9602 Acute respiratory failure with hypercapnia: Secondary | ICD-10-CM | POA: Diagnosis present

## 2019-01-20 DIAGNOSIS — R220 Localized swelling, mass and lump, head: Secondary | ICD-10-CM | POA: Diagnosis not present

## 2019-01-20 DIAGNOSIS — J9601 Acute respiratory failure with hypoxia: Secondary | ICD-10-CM | POA: Diagnosis not present

## 2019-01-20 DIAGNOSIS — F419 Anxiety disorder, unspecified: Secondary | ICD-10-CM | POA: Diagnosis present

## 2019-01-20 DIAGNOSIS — Z809 Family history of malignant neoplasm, unspecified: Secondary | ICD-10-CM

## 2019-01-20 DIAGNOSIS — Z743 Need for continuous supervision: Secondary | ICD-10-CM | POA: Diagnosis not present

## 2019-01-20 DIAGNOSIS — I509 Heart failure, unspecified: Secondary | ICD-10-CM | POA: Diagnosis not present

## 2019-01-20 DIAGNOSIS — R42 Dizziness and giddiness: Secondary | ICD-10-CM | POA: Diagnosis not present

## 2019-01-20 DIAGNOSIS — Z85048 Personal history of other malignant neoplasm of rectum, rectosigmoid junction, and anus: Secondary | ICD-10-CM

## 2019-01-20 DIAGNOSIS — R279 Unspecified lack of coordination: Secondary | ICD-10-CM | POA: Diagnosis not present

## 2019-01-20 DIAGNOSIS — Z8249 Family history of ischemic heart disease and other diseases of the circulatory system: Secondary | ICD-10-CM

## 2019-01-20 DIAGNOSIS — R188 Other ascites: Secondary | ICD-10-CM | POA: Diagnosis present

## 2019-01-20 DIAGNOSIS — K7460 Unspecified cirrhosis of liver: Secondary | ICD-10-CM | POA: Diagnosis present

## 2019-01-20 DIAGNOSIS — I420 Dilated cardiomyopathy: Secondary | ICD-10-CM | POA: Diagnosis present

## 2019-01-20 DIAGNOSIS — J9621 Acute and chronic respiratory failure with hypoxia: Secondary | ICD-10-CM | POA: Diagnosis not present

## 2019-01-20 DIAGNOSIS — R531 Weakness: Secondary | ICD-10-CM | POA: Diagnosis not present

## 2019-01-20 DIAGNOSIS — F329 Major depressive disorder, single episode, unspecified: Secondary | ICD-10-CM | POA: Diagnosis present

## 2019-01-20 DIAGNOSIS — I361 Nonrheumatic tricuspid (valve) insufficiency: Secondary | ICD-10-CM | POA: Diagnosis not present

## 2019-01-20 DIAGNOSIS — J9811 Atelectasis: Secondary | ICD-10-CM | POA: Diagnosis not present

## 2019-01-20 DIAGNOSIS — I959 Hypotension, unspecified: Secondary | ICD-10-CM | POA: Diagnosis not present

## 2019-01-20 DIAGNOSIS — N179 Acute kidney failure, unspecified: Secondary | ICD-10-CM | POA: Diagnosis present

## 2019-01-20 DIAGNOSIS — M6281 Muscle weakness (generalized): Secondary | ICD-10-CM | POA: Diagnosis not present

## 2019-01-20 DIAGNOSIS — Z818 Family history of other mental and behavioral disorders: Secondary | ICD-10-CM

## 2019-01-20 DIAGNOSIS — F10239 Alcohol dependence with withdrawal, unspecified: Secondary | ICD-10-CM | POA: Diagnosis not present

## 2019-01-20 DIAGNOSIS — S299XXA Unspecified injury of thorax, initial encounter: Secondary | ICD-10-CM | POA: Diagnosis not present

## 2019-01-20 DIAGNOSIS — G629 Polyneuropathy, unspecified: Secondary | ICD-10-CM | POA: Diagnosis not present

## 2019-01-20 DIAGNOSIS — J441 Chronic obstructive pulmonary disease with (acute) exacerbation: Secondary | ICD-10-CM | POA: Diagnosis present

## 2019-01-20 DIAGNOSIS — G8929 Other chronic pain: Secondary | ICD-10-CM | POA: Diagnosis present

## 2019-01-20 DIAGNOSIS — I248 Other forms of acute ischemic heart disease: Secondary | ICD-10-CM | POA: Diagnosis present

## 2019-01-20 DIAGNOSIS — Z823 Family history of stroke: Secondary | ICD-10-CM

## 2019-01-20 DIAGNOSIS — R140 Abdominal distension (gaseous): Secondary | ICD-10-CM

## 2019-01-20 DIAGNOSIS — R5381 Other malaise: Secondary | ICD-10-CM | POA: Diagnosis not present

## 2019-01-20 DIAGNOSIS — I5020 Unspecified systolic (congestive) heart failure: Secondary | ICD-10-CM | POA: Diagnosis not present

## 2019-01-20 DIAGNOSIS — Z720 Tobacco use: Secondary | ICD-10-CM | POA: Diagnosis not present

## 2019-01-20 DIAGNOSIS — R52 Pain, unspecified: Secondary | ICD-10-CM | POA: Diagnosis not present

## 2019-01-20 LAB — BRAIN NATRIURETIC PEPTIDE: B Natriuretic Peptide: 1324 pg/mL — ABNORMAL HIGH (ref 0.0–100.0)

## 2019-01-20 LAB — BLOOD GAS, VENOUS
Acid-Base Excess: 7.4 mmol/L — ABNORMAL HIGH (ref 0.0–2.0)
Bicarbonate: 35.6 mmol/L — ABNORMAL HIGH (ref 20.0–28.0)
O2 Saturation: 75.3 %
Patient temperature: 37
pCO2, Ven: 66 mmHg — ABNORMAL HIGH (ref 44.0–60.0)
pH, Ven: 7.34 (ref 7.250–7.430)
pO2, Ven: 43 mmHg (ref 32.0–45.0)

## 2019-01-20 LAB — CBC WITH DIFFERENTIAL/PLATELET
Abs Immature Granulocytes: 0.05 10*3/uL (ref 0.00–0.07)
Basophils Absolute: 0 10*3/uL (ref 0.0–0.1)
Basophils Relative: 0 %
Eosinophils Absolute: 0 10*3/uL (ref 0.0–0.5)
Eosinophils Relative: 0 %
HCT: 46.2 % (ref 39.0–52.0)
Hemoglobin: 15.5 g/dL (ref 13.0–17.0)
Immature Granulocytes: 1 %
Lymphocytes Relative: 16 %
Lymphs Abs: 1.4 10*3/uL (ref 0.7–4.0)
MCH: 32.5 pg (ref 26.0–34.0)
MCHC: 33.5 g/dL (ref 30.0–36.0)
MCV: 96.9 fL (ref 80.0–100.0)
Monocytes Absolute: 1.2 10*3/uL — ABNORMAL HIGH (ref 0.1–1.0)
Monocytes Relative: 14 %
Neutro Abs: 6.2 10*3/uL (ref 1.7–7.7)
Neutrophils Relative %: 69 %
Platelets: 169 10*3/uL (ref 150–400)
RBC: 4.77 MIL/uL (ref 4.22–5.81)
RDW: 15.5 % (ref 11.5–15.5)
WBC: 8.9 10*3/uL (ref 4.0–10.5)
nRBC: 0.6 % — ABNORMAL HIGH (ref 0.0–0.2)

## 2019-01-20 LAB — COMPREHENSIVE METABOLIC PANEL
ALT: 26 U/L (ref 0–44)
AST: 27 U/L (ref 15–41)
Albumin: 3.4 g/dL — ABNORMAL LOW (ref 3.5–5.0)
Alkaline Phosphatase: 104 U/L (ref 38–126)
Anion gap: 10 (ref 5–15)
BUN: 26 mg/dL — ABNORMAL HIGH (ref 8–23)
CO2: 29 mmol/L (ref 22–32)
Calcium: 8.5 mg/dL — ABNORMAL LOW (ref 8.9–10.3)
Chloride: 93 mmol/L — ABNORMAL LOW (ref 98–111)
Creatinine, Ser: 1.41 mg/dL — ABNORMAL HIGH (ref 0.61–1.24)
GFR calc Af Amer: 60 mL/min — ABNORMAL LOW (ref >60)
GFR calc non Af Amer: 52 mL/min — ABNORMAL LOW (ref >60)
Glucose, Bld: 128 mg/dL — ABNORMAL HIGH (ref 70–99)
Potassium: 4 mmol/L (ref 3.5–5.1)
Sodium: 132 mmol/L — ABNORMAL LOW (ref 135–145)
Total Bilirubin: 0.9 mg/dL (ref 0.3–1.2)
Total Protein: 7.1 g/dL (ref 6.5–8.1)

## 2019-01-20 LAB — URINE DRUG SCREEN, QUALITATIVE (ARMC ONLY)
Amphetamines, Ur Screen: NOT DETECTED
Barbiturates, Ur Screen: NOT DETECTED
Benzodiazepine, Ur Scrn: POSITIVE — AB
Cannabinoid 50 Ng, Ur ~~LOC~~: NOT DETECTED
Cocaine Metabolite,Ur ~~LOC~~: NOT DETECTED
MDMA (Ecstasy)Ur Screen: NOT DETECTED
Methadone Scn, Ur: NOT DETECTED
Opiate, Ur Screen: NOT DETECTED
Phencyclidine (PCP) Ur S: NOT DETECTED
Tricyclic, Ur Screen: NOT DETECTED

## 2019-01-20 LAB — TROPONIN I (HIGH SENSITIVITY): Troponin I (High Sensitivity): 208 ng/L (ref <18)

## 2019-01-20 LAB — ETHANOL: Alcohol, Ethyl (B): <10 mg/dL (ref <10)

## 2019-01-20 LAB — AMMONIA: Ammonia: 36 umol/L — ABNORMAL HIGH (ref 9–35)

## 2019-01-20 MED ORDER — ALPRAZOLAM 0.5 MG PO TABS
1.0000 mg | ORAL_TABLET | Freq: Three times a day (TID) | ORAL | Status: DC | PRN
  Administered 2019-01-21 (×2): 1 mg via ORAL
  Filled 2019-01-20 (×2): qty 2

## 2019-01-20 MED ORDER — ONDANSETRON HCL 4 MG/2ML IJ SOLN: 4.0000 mg | Freq: Four times a day (QID) | INTRAMUSCULAR | Status: DC | PRN

## 2019-01-20 MED ORDER — LACTULOSE 10 GM/15ML PO SOLN
30.0000 g | Freq: Two times a day (BID) | ORAL | Status: DC
  Administered 2019-01-20 – 2019-01-25 (×10): 30 g via ORAL
  Filled 2019-01-20 (×10): qty 60

## 2019-01-20 MED ORDER — ACETAMINOPHEN 650 MG RE SUPP: 650.0000 mg | Freq: Four times a day (QID) | RECTAL | Status: DC | PRN

## 2019-01-20 MED ORDER — FUROSEMIDE 10 MG/ML IJ SOLN: 40.0000 mg | Freq: Two times a day (BID) | INTRAMUSCULAR | Status: DC

## 2019-01-20 MED ORDER — VITAMIN B-12 100 MCG PO TABS
100.0000 ug | ORAL_TABLET | Freq: Every day | ORAL | Status: DC
  Administered 2019-01-21 – 2019-01-25 (×5): 100 ug via ORAL
  Filled 2019-01-20 (×5): qty 1

## 2019-01-20 MED ORDER — GABAPENTIN 300 MG PO CAPS
300.0000 mg | ORAL_CAPSULE | Freq: Three times a day (TID) | ORAL | Status: DC
  Administered 2019-01-20 – 2019-01-25 (×15): 300 mg via ORAL
  Filled 2019-01-20 (×15): qty 1

## 2019-01-20 MED ORDER — ACETAMINOPHEN 325 MG PO TABS
650.0000 mg | ORAL_TABLET | Freq: Four times a day (QID) | ORAL | Status: DC | PRN
  Administered 2019-01-21 – 2019-01-22 (×2): 650 mg via ORAL
  Filled 2019-01-20 (×2): qty 2

## 2019-01-20 MED ORDER — VITAMIN B-1 100 MG PO TABS
100.0000 mg | ORAL_TABLET | Freq: Every day | ORAL | Status: DC
  Administered 2019-01-21: 100 mg via ORAL
  Filled 2019-01-20: qty 1

## 2019-01-20 MED ORDER — ENOXAPARIN SODIUM 40 MG/0.4ML ~~LOC~~ SOLN
40.0000 mg | SUBCUTANEOUS | Status: DC
  Administered 2019-01-20 – 2019-01-24 (×5): 40 mg via SUBCUTANEOUS
  Filled 2019-01-20 (×5): qty 0.4

## 2019-01-20 MED ORDER — FUROSEMIDE 10 MG/ML IJ SOLN
40.0000 mg | Freq: Two times a day (BID) | INTRAMUSCULAR | Status: DC
  Administered 2019-01-20 – 2019-01-23 (×7): 40 mg via INTRAVENOUS
  Filled 2019-01-20 (×8): qty 4

## 2019-01-20 MED ORDER — IPRATROPIUM-ALBUTEROL 0.5-2.5 (3) MG/3ML IN SOLN
3.0000 mL | Freq: Once | RESPIRATORY_TRACT | Status: AC
  Administered 2019-01-20: 11:00:00 3 mL via RESPIRATORY_TRACT
  Filled 2019-01-20: qty 3

## 2019-01-20 MED ORDER — SPIRONOLACTONE 25 MG PO TABS: 200.0000 mg | ORAL_TABLET | Freq: Every day | ORAL | Status: DC

## 2019-01-20 MED ORDER — IPRATROPIUM-ALBUTEROL 0.5-2.5 (3) MG/3ML IN SOLN
3.0000 mL | Freq: Four times a day (QID) | RESPIRATORY_TRACT | Status: DC
  Administered 2019-01-20 – 2019-01-25 (×19): 3 mL via RESPIRATORY_TRACT
  Filled 2019-01-20 (×20): qty 3

## 2019-01-20 MED ORDER — ONDANSETRON HCL 4 MG PO TABS: 4.0000 mg | ORAL_TABLET | Freq: Four times a day (QID) | ORAL | Status: DC | PRN

## 2019-01-20 MED ORDER — BUDESONIDE 0.5 MG/2ML IN SUSP
0.5000 mg | Freq: Two times a day (BID) | RESPIRATORY_TRACT | Status: DC
  Administered 2019-01-20 – 2019-01-25 (×10): 0.5 mg via RESPIRATORY_TRACT
  Filled 2019-01-20 (×10): qty 2

## 2019-01-20 MED ORDER — FUROSEMIDE 10 MG/ML IJ SOLN
40.0000 mg | Freq: Once | INTRAMUSCULAR | Status: AC
  Administered 2019-01-20: 40 mg via INTRAVENOUS
  Filled 2019-01-20: qty 4

## 2019-01-20 MED ORDER — SPIRONOLACTONE 25 MG PO TABS
100.0000 mg | ORAL_TABLET | Freq: Every day | ORAL | Status: DC
  Administered 2019-01-21 – 2019-01-25 (×5): 100 mg via ORAL
  Filled 2019-01-20 (×5): qty 4

## 2019-01-20 NOTE — H&P (Signed)
[facility] [place] NAME: Andrew Carey    MR#:  [id number]  DATE OF BIRTH:  05-16-1951  DATE OF ADMISSION:  01/20/2019  PRIMARY CARE PHYSICIAN: Birdie Sons, MD   REQUESTING/REFERRING PHYSICIAN: Dr. Lenise Arena  CHIEF COMPLAINT:   Chief Complaint  Patient presents with   Dizziness    HISTORY OF PRESENT ILLNESS:  Andrew Carey  is a 68 y.o. male with a known history of alcohol abuse, liver cirrhosis, depression, COPD with ongoing tobacco abuse who presented to the hospital due to dizziness and weakness.  Patient says he has not been feeling well for the past few weeks progressively getting more weak and gets dizzy when he attempts to stand up.  He has noticed increase in his abdominal girth and also increasing lower extremity edema and is not able to carry his activities of daily living and had a fall today and therefore came to the ER for further evaluation.  Patient was noted to be hypoxic with room air O2 sats in the low 80s and thought to have acute respiratory failure with hypoxia secondary to CHF and also underlying COPD and therefore hospitalist services were contacted for admission.  Patient denies any fevers, chills, loss of taste, any sick contacts or recent travel history.  Patient's COVID-19 test is still pending.  PAST MEDICAL HISTORY:   Past Medical History:  Diagnosis Date   Allergy    Arthritis    Cirrhosis ([place])    Colon polyps    Colorectal cancer ([place])    Depression    Diverticulosis    Liver lesion     PAST SURGICAL HISTORY:   Past Surgical History:  Procedure Laterality Date   COLON SURGERY     HERNIA REPAIR      SOCIAL HISTORY:   Social History   Tobacco Use   Smoking status: Current Every Day Smoker    Packs/day: 1.00    Years: 50.00    Pack years: 50.00    Types: Cigarettes   Smokeless tobacco: Never Used  Substance Use Topics   Alcohol use: Yes    Frequency: Never   Comment: 3-6 beer daily for the past 40-50 yrs.     FAMILY HISTORY:   Family History  Problem Relation Age of Onset   Heart failure Father    Congestive Heart Failure Father    Heart disease Father    Stroke Father    Mental illness Father    Cancer Mother    Colon polyps Mother    Colon polyps Sister    Depression Sister    Mental illness Sister    Mental illness Sister    Depression Sister    Mental illness Brother    Depression Brother    Mental illness Sister    Depression Sister     DRUG ALLERGIES:   Allergies  Allergen Reactions   Fluoxetine     Hallucinations   Codeine Nausea And Vomiting and Nausea Only   Abilify [Aripiprazole]     Anxiety and panic attacks   Trazodone And Nefazodone     Vivid dreams    REVIEW OF SYSTEMS:   Review of Systems  Constitutional: Negative for fever and weight loss.  HENT: Negative for congestion, nosebleeds and tinnitus.   Eyes: Negative for blurred vision, double vision and redness.  Respiratory: Positive for cough and shortness of breath. Negative for hemoptysis.   Cardiovascular: Positive for leg swelling. Negative for chest  pain, orthopnea and PND.  Gastrointestinal: Negative for abdominal pain, diarrhea, melena, nausea and vomiting.  Genitourinary: Negative for dysuria, hematuria and urgency.  Musculoskeletal: Positive for falls. Negative for joint pain.  Neurological: Positive for weakness (generalized weakness. ). Negative for dizziness, tingling, sensory change, focal weakness, seizures and headaches.  Endo/Heme/Allergies: Negative for polydipsia. Does not bruise/bleed easily.  Psychiatric/Behavioral: Negative for depression and memory loss. The patient is not nervous/anxious.     MEDICATIONS AT HOME:   Prior to Admission medications   Medication Sig Start Date End Date Taking? Authorizing Provider  ALPRAZolam Duanne Moron) 2 MG tablet Take 0.5-1 tablets (1-2 mg total) by mouth 3 (three) times daily  as needed for anxiety. 11/20/18   Birdie Sons, MD  furosemide (LASIX) 40 MG tablet Take 1 tablet (40 mg total) by mouth daily. 04/18/18   Birdie Sons, MD  gabapentin (NEURONTIN) 300 MG capsule One tablet at night for 3 nights, then one tablet twice daily for nerve pain in feet 01/15/19   Birdie Sons, MD  lactulose (CHRONULAC) 10 GM/15ML solution TAKE 30 MILLILITERS BY MOUTH THREE TIMES A DAY IF NEEDED 02/02/18   Birdie Sons, MD  ondansetron (ZOFRAN ODT) 4 MG disintegrating tablet Take 0.5-1 tablets (2-4 mg total) by mouth every 8 (eight) hours as needed for nausea or vomiting. 11/14/18   Birdie Sons, MD  oxyCODONE (ROXICODONE) 15 MG immediate release tablet take 1/2 tablets by mouth two to three times a day as needed 12/31/18   Birdie Sons, MD  spironolactone (ALDACTONE) 100 MG tablet TAKE 2 TABLETS BY MOUTH ONCE DAILY Patient taking differently: Take 100-200 mg by mouth daily.  01/13/18   Birdie Sons, MD  thiamine (VITAMIN B-1) 100 MG tablet Take 100 mg by mouth daily. 10/03/17   [provider]  vitamin B-12 (CYANOCOBALAMIN) 100 MCG tablet Take 100 mcg by mouth daily.    [provider]      VITAL SIGNS:  Blood pressure 117/85, pulse (!) 42, temperature 98.8 F (37.1 C), temperature source Oral, resp. rate (!) 22, height 6' (1.829 m), weight 104.3 kg, SpO2 (!) 89 %.  PHYSICAL EXAMINATION:  Physical Exam  GENERAL:  69 y.o.-year-old patient lying in the bed in mild Resp. Distress.  EYES: Pupils equal, round, reactive to light and accommodation. No scleral icterus. Extraocular muscles intact.  HEENT: Head atraumatic, normocephalic. Oropharynx and nasopharynx clear. No oropharyngeal erythema, moist oral mucosa  NECK:  Supple, no jugular venous distention. No thyroid enlargement, no tenderness.  LUNGS: Prolonged inspiratory & expiratory phase, diffuse rales at bases, occasional rhonchi occasional wheezing, negative use accessory  muscles. CARDIOVASCULAR: S1, S2 RRR. No murmurs, rubs, gallops, clicks.  ABDOMEN: Soft, nontender, distended, + fluid wave due to ascites. Bowel sounds present. No organomegaly or mass.  EXTREMITIES: +1-2 pitting edema bilaterally right greater than left, cyanosis, or clubbing. + 2 pedal & radial pulses b/l.   NEUROLOGIC: Cranial nerves II through XII are intact. No focal Motor or sensory deficits appreciated b/l. Globally weak.   PSYCHIATRIC: The patient is alert and oriented x 3.  SKIN: No obvious rash, lesion, or ulcer.   LABORATORY PANEL:   CBC Recent Labs  Lab 01/20/19 1019  WBC 8.9  HGB 15.5  HCT 46.2  PLT 169   ------------------------------------------------------------------------------------------------------------------  Chemistries  Recent Labs  Lab 01/20/19 1019  NA 132*  K 4.0  CL 93*  CO2 29  GLUCOSE 128*  BUN 26*  CREATININE 1.41*  CALCIUM 8.5*  AST 27  ALT 26  ALKPHOS 104  BILITOT 0.9   ------------------------------------------------------------------------------------------------------------------  Cardiac Enzymes No results for input(s): TROPONINI in the last 168 hours. ------------------------------------------------------------------------------------------------------------------  RADIOLOGY:  Ct Head Wo Contrast  Result Date: 01/20/2019 CLINICAL DATA:  Pt arrives via EMS from [facility] for complaint of dizziness and fall, c/o of hitting his head on the sink. History of liver cirrhosis, COPD, CHF and anxiety disorder. Reports taking oxycodone this morning. EXAM: CT HEAD WITHOUT CONTRAST TECHNIQUE: Contiguous axial images were obtained from the base of the skull through the vertex without intravenous contrast. COMPARISON:  08/21/2006 FINDINGS: Brain: There is mild central and cortical atrophy. There is no intra or extra-axial fluid collection or mass lesion. The basilar cisterns and ventricles have a normal appearance. There is no CT  evidence for acute infarction or hemorrhage. Vascular: There is atherosclerotic calcification of the internal carotid arteries. No hyperdense vessels. Skull: Normal. Negative for fracture or focal lesion. Sinuses/Orbits: No acute finding. Other: There is focal scalp edema in the LEFT posterior parietal region without underlying calvarial fracture. IMPRESSION: 1. No evidence for acute intracranial abnormality. 2. Mild atrophy. 3. LEFT posterior parietal scalp edema. Electronically Signed   By: Nolon Nations M.D.   On: 01/20/2019 11:05   Dg Chest Port 1 View  Result Date: 01/20/2019 CLINICAL DATA:  Dizziness and fall. EXAM: PORTABLE CHEST 1 VIEW COMPARISON:  06/02/2017 FINDINGS: The cardio pericardial silhouette is enlarged. There is pulmonary vascular congestion without overt pulmonary edema. Diffuse interstitial opacity likely related to edema. No substantial pleural effusion. The visualized bony structures of the thorax are intact. Old right rib fractures again noted. Telemetry leads overlie the chest. IMPRESSION: Enlargement of cardiopericardial silhouette with vascular congestion and interstitial pulmonary edema pattern. Electronically Signed   By: Misty Stanley M.D.   On: 01/20/2019 10:45     IMPRESSION AND PLAN:   68 year old male with past medical history of alcohol abuse, liver cirrhosis, anxiety, COPD with ongoing tobacco abuse who presents to the hospital due to weakness, dizziness and noted to be in acute respiratory failure with hypoxia.  1.  Acute respiratory failure with hypoxia- secondary to a combination of CHF and also underlying COPD. -Continue O2 supplementation, will treat patient's underlying COPD with scheduled duo nebs, Pulmicort nebs. - Also diurese the patient with IV Lasix, wean oxygen as tolerated.  2.  CHF- unclear if it systolic or diastolic dysfunction presently.  Will get echocardiogram. - Suspected to have possible underlying dilated cardiomyopathy secondary to  alcohol abuse.  We will diurese the patient with IV Lasix, follow I's and O's and daily weights.  3.  Elevated troponin-secondary to supply demand ischemia from hypoxemia and also underlying CHF. -Will follow serial troponins, continue diuresis for CHF as mentioned above.  Will await echocardiogram results.  If needed consider cardiology consult.  4.  COPD-mild acute exacerbation secondary to ongoing tobacco abuse.  Patient's chest x-ray does not show any evidence of pneumonia just mild CHF. - We will place the patient on scheduled duo nebs, Pulmicort nebs. -We will assess the patient for home oxygen prior to discharge.  5.  Liver cirrhosis-secondary to alcohol abuse. -No evidence of hepatic encephalopathy presently.  Patient does have abdominal distention and ascites, will get ultrasound-guided paracentesis tomorrow. - Continue diuresis with Lasix, will resume Aldactone.  6.  Neuropathy-continue gabapentin.  7.  Anxiety-continue Xanax.  8.  Chronic pain-continue oxycodone.    All the records are reviewed and case discussed with ED provider. Management  plans discussed with the patient, family and they are in agreement.  CODE STATUS: DNR  TOTAL TIME TAKING CARE OF THIS PATIENT: 40 minutes.    Henreitta Leber M.D on 01/20/2019 at 12:31 PM  Between 7am to 6pm - Pager - [phone]  After 6pm go to [email or web address] - password [email or web address] [email or web address]  Office  [phone]  CC: Primary care physician; Birdie Sons, MD

## 2019-01-20 NOTE — ED Notes (Signed)
ED TO INPATIENT HANDOFF REPORT  ED Nurse Name and Phone #: Anderson Malta [phone]  S Name/Age/Gender Andrew Carey 68 y.o. male Room/Bed: ED04A/ED04A  Code Status   Code Status: Prior  Home/SNF/Other Home Patient oriented to: self, place, time and situation Is this baseline? Yes   Triage Complete: Triage complete  Chief Complaint Dizziness  Triage Note Pt arrives via EMS from [facility] for complaint of dizziness and fall, c/o of hitting his head on the sink. History of liver cirrhosis, COPD, CHF and anxiety disorder. Reports taking oxycodone this morning. EMS reports low O2 Sat, and put pt on NRB.  80% on RA, placed on 2L [place]   Allergies Allergies  Allergen Reactions  . Fluoxetine     Hallucinations  . Codeine Nausea And Vomiting and Nausea Only  . Abilify [Aripiprazole]     Anxiety and panic attacks  . Trazodone And Nefazodone     Vivid dreams    Level of Care/Admitting Diagnosis ED Disposition    ED Disposition Condition [facility]: [place] [facility] [100120]  Level of Care: Med-Surg [16]  Covid Evaluation: Asymptomatic Screening Protocol (No Symptoms)  Diagnosis: CHF (congestive heart failure) [facility]) [id number]  Admitting Physician: Henreitta Leber [id number]  Attending Physician: Henreitta Leber [id number]  Estimated length of stay: past midnight tomorrow  Certification:: I certify this patient will need inpatient services for at least 2 midnights  PT Class (Do Not Modify): Inpatient [101]  PT Acc Code (Do Not Modify): Private [1]       B Medical/Surgery History Past Medical History:  Diagnosis Date  . Allergy   . Arthritis   . Cirrhosis ([place])   . Colon polyps   . Colorectal cancer ([place])   . Depression   . Diverticulosis   . Liver lesion    Past Surgical History:  Procedure Laterality Date  . COLON SURGERY    . HERNIA REPAIR       A IV Location/Drains/Wounds Patient Lines/Drains/Airways Status    Active Line/Drains/Airways    Name:   Placement date:   Placement time:   Site:   Days:   Peripheral IV 01/20/19 Right Antecubital   01/20/19    1057    Antecubital   less than 1          Intake/Output Last 24 hours  Intake/Output Summary (Last 24 hours) at 01/20/2019 1303 Last data filed at 01/20/2019 1203 Gross per 24 hour  Intake -  Output 250 ml  Net -250 ml    Labs/Imaging Results for orders placed or performed during the hospital encounter of 01/20/19 (from the past 48 hour(s))  CBC with Differential     Status: Abnormal   Collection Time: 01/20/19 10:19 AM  Result Value Ref Range   WBC 8.9 4.0 - 10.5 K/uL   RBC 4.77 4.22 - 5.81 MIL/uL   Hemoglobin 15.5 13.0 - 17.0 g/dL   HCT 46.2 39.0 - 52.0 %   MCV 96.9 80.0 - 100.0 fL   MCH 32.5 26.0 - 34.0 pg   MCHC 33.5 30.0 - 36.0 g/dL   RDW 15.5 11.5 - 15.5 %   Platelets 169 150 - 400 K/uL   nRBC 0.6 (H) 0.0 - 0.2 %   Neutrophils Relative % 69 %   Neutro Abs 6.2 1.7 - 7.7 K/uL   Lymphocytes Relative 16 %   Lymphs Abs 1.4 0.7 - 4.0 K/uL   Monocytes Relative 14 %  Monocytes Absolute 1.2 (H) 0.1 - 1.0 K/uL   Eosinophils Relative 0 %   Eosinophils Absolute 0.0 0.0 - 0.5 K/uL   Basophils Relative 0 %   Basophils Absolute 0.0 0.0 - 0.1 K/uL   Immature Granulocytes 1 %   Abs Immature Granulocytes 0.05 0.00 - 0.07 K/uL    Comment: Performed at [facility], [address] [place], [place] [address]  Brain natriuretic peptide     Status: Abnormal   Collection Time: 01/20/19 10:19 AM  Result Value Ref Range   B Natriuretic Peptide 1,324.0 (H) 0.0 - 100.0 pg/mL    Comment: Performed at [facility], [place], [place] [address]  Troponin I (High Sensitivity)     Status: Abnormal   Collection Time: 01/20/19 10:19 AM  Result Value Ref Range   Troponin I (High Sensitivity) 208 (HH) <18 ng/L    Comment: CRITICAL RESULT CALLED TO, READ BACK BY AND VERIFIED WITH Kahliya Fraleigh @1154  01/20/19  AKT (NOTE) Elevated high sensitivity troponin I (hsTnI) values and significant  changes across serial measurements may suggest ACS but many other  chronic and acute conditions are known to elevate hsTnI results.  Refer to the "Links" section for chest pain algorithms and additional  guidance. Performed at [facility], [address] [place], [place] [address]   Blood gas, venous     Status: Abnormal   Collection Time: 01/20/19 10:19 AM  Result Value Ref Range   pH, Ven 7.34 7.250 - 7.430   pCO2, Ven 66 (H) 44.0 - 60.0 mmHg   pO2, Ven 43.0 32.0 - 45.0 mmHg   Bicarbonate 35.6 (H) 20.0 - 28.0 mmol/L   Acid-Base Excess 7.4 (H) 0.0 - 2.0 mmol/L   O2 Saturation 75.3 %   Patient temperature 37.0    Collection site VEIN    Sample type VEIN     Comment: Performed at [facility], [address] [place], [place] [address]  Ethanol     Status: None   Collection Time: 01/20/19 10:19 AM  Result Value Ref Range   Alcohol, Ethyl (B) <10 <10 mg/dL    Comment: (NOTE) Lowest detectable limit for serum alcohol is 10 mg/dL. For medical purposes only. Performed at [facility], [address] [place], [place] [address]   Urine Drug Screen, Qualitative [facility] only)     Status: Abnormal   Collection Time: 01/20/19 10:19 AM  Result Value Ref Range   Tricyclic, Ur Screen NONE DETECTED NONE DETECTED   Amphetamines, Ur Screen NONE DETECTED NONE DETECTED   MDMA (Ecstasy)Ur Screen NONE DETECTED NONE DETECTED   Cocaine Metabolite,Ur [place] NONE DETECTED NONE DETECTED   Opiate, Ur Screen NONE DETECTED NONE DETECTED   Phencyclidine (PCP) Ur S NONE DETECTED NONE DETECTED   Cannabinoid 50 Ng, Ur [place] NONE DETECTED NONE DETECTED   Barbiturates, Ur Screen NONE DETECTED NONE DETECTED   Benzodiazepine, Ur Scrn POSITIVE (A) NONE DETECTED   Methadone Scn, Ur NONE DETECTED NONE DETECTED    Comment: (NOTE) Tricyclics + metabolites, urine    Cutoff 1000 ng/mL Amphetamines +  metabolites, urine  Cutoff 1000 ng/mL MDMA (Ecstasy), urine              Cutoff 500 ng/mL Cocaine Metabolite, urine          Cutoff 300 ng/mL Opiate + metabolites, urine        Cutoff 300 ng/mL Phencyclidine (PCP), urine         Cutoff  25 ng/mL Cannabinoid, urine                 Cutoff 50 ng/mL Barbiturates + metabolites, urine  Cutoff 200 ng/mL Benzodiazepine, urine              Cutoff 200 ng/mL Methadone, urine                   Cutoff 300 ng/mL The urine drug screen provides only a preliminary, unconfirmed analytical test result and should not be used for non-medical purposes. Clinical consideration and professional judgment should be applied to any positive drug screen result due to possible interfering substances. A more specific alternate chemical method must be used in order to obtain a confirmed analytical result. Gas chromatography / mass spectrometry (GC/MS) is the preferred confirmat ory method. Performed at [facility], [address] [place], [place] [address]   Ammonia     Status: Abnormal   Collection Time: 01/20/19 10:19 AM  Result Value Ref Range   Ammonia 36 (H) 9 - 35 umol/L    Comment: Performed at [facility], [address] [place], [place] [address]  Comprehensive metabolic panel     Status: Abnormal   Collection Time: 01/20/19 10:19 AM  Result Value Ref Range   Sodium 132 (L) 135 - 145 mmol/L   Potassium 4.0 3.5 - 5.1 mmol/L   Chloride 93 (L) 98 - 111 mmol/L   CO2 29 22 - 32 mmol/L   Glucose, Bld 128 (H) 70 - 99 mg/dL   BUN 26 (H) 8 - 23 mg/dL   Creatinine, Ser 1.41 (H) 0.61 - 1.24 mg/dL   Calcium 8.5 (L) 8.9 - 10.3 mg/dL   Total Protein 7.1 6.5 - 8.1 g/dL   Albumin 3.4 (L) 3.5 - 5.0 g/dL   AST 27 15 - 41 U/L   ALT 26 0 - 44 U/L   Alkaline Phosphatase 104 38 - 126 U/L   Total Bilirubin 0.9 0.3 - 1.2 mg/dL   GFR calc non Af Amer 52 (L) >60 mL/min   GFR calc Af Amer 60 (L) >60 mL/min   Anion gap 10 5 - 15    Comment:  Performed at [facility], [address] [place], [place] [address]   Ct Head Wo Contrast  Result Date: 01/20/2019 CLINICAL DATA:  Pt arrives via EMS from [facility] for complaint of dizziness and fall, c/o of hitting his head on the sink. History of liver cirrhosis, COPD, CHF and anxiety disorder. Reports taking oxycodone this morning. EXAM: CT HEAD WITHOUT CONTRAST TECHNIQUE: Contiguous axial images were obtained from the base of the skull through the vertex without intravenous contrast. COMPARISON:  08/21/2006 FINDINGS: Brain: There is mild central and cortical atrophy. There is no intra or extra-axial fluid collection or mass lesion. The basilar cisterns and ventricles have a normal appearance. There is no CT evidence for acute infarction or hemorrhage. Vascular: There is atherosclerotic calcification of the internal carotid arteries. No hyperdense vessels. Skull: Normal. Negative for fracture or focal lesion. Sinuses/Orbits: No acute finding. Other: There is focal scalp edema in the LEFT posterior parietal region without underlying calvarial fracture. IMPRESSION: 1. No evidence for acute intracranial abnormality. 2. Mild atrophy. 3. LEFT posterior parietal scalp edema. Electronically Signed   By: Nolon Nations M.D.   On: 01/20/2019 11:05   Dg Chest Port 1 View  Result Date: 01/20/2019 CLINICAL DATA:  Dizziness and fall. EXAM: PORTABLE CHEST 1 VIEW COMPARISON:  06/02/2017 FINDINGS: The cardio pericardial silhouette is enlarged. There is pulmonary vascular congestion without overt pulmonary edema. Diffuse interstitial opacity likely related to edema. No substantial pleural effusion. The visualized bony structures of the thorax are intact. Old right rib fractures again noted. Telemetry leads overlie the chest. IMPRESSION: Enlargement of cardiopericardial silhouette with vascular congestion and interstitial pulmonary edema pattern. Electronically Signed   By: Misty Stanley M.D.   On:  01/20/2019 10:45    Pending Labs Unresulted Labs (From admission, onward)    Start     Ordered   01/20/19 1006  Novel Coronavirus, NAA (send-out to ref lab)  (Asymptomatic/Tier 2 Patients Labs)  Once,   STAT    Question Answer Comment  Is this test for diagnosis or screening Screening   Symptomatic for COVID-19 as defined by CDC Yes   Date of Symptom Onset 01/20/2019   Hospitalized for COVID-19 No   Admitted to ICU for COVID-19 No   Previously tested for COVID-19 No   Resident in a congregate (group) care setting No   Employed in healthcare setting No      01/20/19 1006   Signed and Held  HIV antibody (Routine Testing)  Once,   R     Signed and Held   Signed and Held  Basic metabolic panel  Tomorrow morning,   R     Signed and Held   Signed and Held  CBC  Tomorrow morning,   R     Signed and Held   Signed and Held  CBC  (enoxaparin (LOVENOX)    CrCl >/= 30 ml/min)  Once,   R    Comments: Baseline for enoxaparin therapy IF NOT ALREADY DRAWN.  Notify MD if PLT < 100 K.    Signed and Held   Signed and Held  Creatinine, serum  (enoxaparin (LOVENOX)    CrCl >/= 30 ml/min)  Once,   R    Comments: Baseline for enoxaparin therapy IF NOT ALREADY DRAWN.    Signed and Held   Signed and Held  Creatinine, serum  (enoxaparin (LOVENOX)    CrCl >/= 30 ml/min)  Weekly,   R    Comments: while on enoxaparin therapy    Signed and Held          Vitals/Pain Today's Vitals   01/20/19 1126 01/20/19 1130 01/20/19 1203 01/20/19 1212  BP:   117/85   Pulse:  86 (!) 42   Resp:  (!) 26 (!) 22   Temp:      TempSrc:      SpO2:  94% (!) 89%   Weight:      Height:      PainSc: 3    0-No pain    Isolation Precautions No active isolations  Medications Medications  ipratropium-albuterol (DUONEB) 0.5-2.5 (3) MG/3ML nebulizer solution 3 mL (3 mLs Nebulization Given 01/20/19 1120)  furosemide (LASIX) injection 40 mg (40 mg Intravenous Given 01/20/19 1206)    Mobility walks Moderate fall risk    Focused Assessments Pulmonary Assessment Handoff:  Lung sounds: L Breath Sounds: Clear R Breath Sounds: Clear O2 Device: Nasal Cannula O2 Flow Rate (L/min): 4 L/min      R Recommendations: See Admitting Provider Note  Report given to:   Additional Notes:

## 2019-01-20 NOTE — ED Notes (Signed)
Pt answers questions appropriately, drifts off to sleep quickly, appears lethargic.

## 2019-01-20 NOTE — ED Provider Notes (Addendum)
[facility] Emergency Department Provider Note       Time seen: ----------------------------------------- 10:06 AM on 01/20/2019 -----------------------------------------   I have reviewed the triage vital signs and the nursing notes.  HISTORY   Chief Complaint Dizziness    HPI Andrew Carey is a 68 y.o. male with a history of allergies, arthritis, cirrhosis, colorectal cancer, depression, alcohol use disorder who presents to the ED for dizziness and a fall.  Patient does complain of hitting his head on the sink.  He cannot maintain his balance at this time.  He reports taking oxycodone this morning.  EMS reports low oxygen saturation and placed him on a nonrebreather.  His sister actually ended up calling EMS.  He was around 80% on room air.  Past Medical History:  Diagnosis Date  . Allergy   . Arthritis   . Cirrhosis ([place])   . Colon polyps   . Colorectal cancer ([place])   . Depression   . Diverticulosis   . Liver lesion     Patient Active Problem List   Diagnosis Date Noted  . Uncomplicated alcohol dependence ([place])   . Tobacco use disorder   . Severe recurrent major depression without psychotic features ([place]) 06/02/2017  . Benzodiazepine overdose 06/02/2017  . Moderate recurrent major depression ([place]) 02/17/2017  . Adjustment disorder with mixed anxiety and depressed mood 02/17/2017  . Alcohol abuse 02/17/2017  . Elevated coronary artery calcium score 12/22/2016  . GAD (generalized anxiety disorder) 06/14/2016  . Chronic joint pain 02/16/2016  . Pruritic condition 02/10/2016  . Panic disorder 02/10/2016  . Alcoholic cirrhosis of liver with ascites ([place]) 02/10/2016  . Hypotension due to drugs 01/19/2016  . Abdominal pain 01/14/2016  . Encephalopathy, hepatic ([place]) 01/14/2016    Past Surgical History:  Procedure Laterality Date  . COLON SURGERY    . HERNIA REPAIR      Allergies Fluoxetine, Codeine, Abilify [aripiprazole], and  Trazodone and nefazodone  Social History Social History   Tobacco Use  . Smoking status: Current Every Day Smoker    Packs/day: 1.00    Years: 50.00    Pack years: 50.00    Types: Cigarettes  . Smokeless tobacco: Never Used  Substance Use Topics  . Alcohol use: Yes    Frequency: Never    Comment: occasional glass of red wine  . Drug use: No   Review of Systems Constitutional: Negative for fever. Cardiovascular: Negative for chest pain. Respiratory: Positive for shortness of breath Gastrointestinal: Negative for abdominal pain, vomiting and diarrhea. Musculoskeletal: Negative for back pain. Skin: Negative for rash. Neurological: Negative for headaches, positive for weakness and balance disturbance  All systems negative/normal/unremarkable except as stated in the HPI  ____________________________________________   PHYSICAL EXAM:  VITAL SIGNS: ED Triage Vitals [01/20/19 1005]  Enc Vitals Group     BP      Pulse Rate (!) 47     Resp      Temp      Temp src      SpO2      Weight      Height      Head Circumference      Peak Flow      Pain Score      Pain Loc      Pain Edu?      Excl. in [place]?    Constitutional: Alert and oriented.  Chronically ill-appearing, mild to moderate distress Eyes: Conjunctivae are normal. Normal extraocular movements. ENT  Head: Normocephalic and atraumatic.      Nose: No congestion/rhinnorhea.      Mouth/Throat: Mucous membranes are moist.      Neck: No stridor. Cardiovascular: Normal rate, regular rhythm. No murmurs, rubs, or gallops. Respiratory: Tachypnea with clear breath sounds Gastrointestinal: Soft and nontender. Normal bowel sounds Musculoskeletal: Nontender with normal range of motion in extremities. No lower extremity tenderness nor edema. Neurologic:  Normal speech and language. No gross focal neurologic deficits are appreciated.  Skin:  Skin is warm, dry and intact.  Cyanosis is noted in the face and  lips Psychiatric: Mood and affect are normal. Speech and behavior are normal.  ____________________________________________  EKG: Interpreted by me.  Sinus rhythm with rate of 87 bpm, PVCs, wide QRS, long QT  ____________________________________________  ED COURSE:  As part of my medical decision making, I reviewed the following data within the [place] History obtained from family if available, nursing notes, old chart and ekg, as well as notes from prior ED visits. Patient presented for dizziness with recent fall, dyspnea and hypoxia, we will assess with labs and imaging as indicated at this time.   Procedures  Andrew Carey was evaluated in Emergency Department on 01/20/2019 for the symptoms described in the history of present illness. He was evaluated in the context of the global COVID-19 pandemic, which necessitated consideration that the patient might be at risk for infection with the SARS-CoV-2 virus that causes COVID-19. Institutional protocols and algorithms that pertain to the evaluation of patients at risk for COVID-19 are in a state of rapid change based on information released by regulatory bodies including the CDC and federal and state organizations. These policies and algorithms were followed during the patient's care in the ED.  ____________________________________________   LABS (pertinent positives/negatives)  Labs Reviewed  CBC WITH DIFFERENTIAL/PLATELET - Abnormal; Notable for the following components:      Result Value   nRBC 0.6 (*)    Monocytes Absolute 1.2 (*)    All other components within normal limits  BRAIN NATRIURETIC PEPTIDE - Abnormal; Notable for the following components:   B Natriuretic Peptide 1,324.0 (*)    All other components within normal limits  BLOOD GAS, VENOUS - Abnormal; Notable for the following components:   pCO2, Ven 66 (*)    Bicarbonate 35.6 (*)    Acid-Base Excess 7.4 (*)    All other components within normal  limits  AMMONIA - Abnormal; Notable for the following components:   Ammonia 36 (*)    All other components within normal limits  COMPREHENSIVE METABOLIC PANEL - Abnormal; Notable for the following components:   Sodium 132 (*)    Chloride 93 (*)    Glucose, Bld 128 (*)    BUN 26 (*)    Creatinine, Ser 1.41 (*)    Calcium 8.5 (*)    Albumin 3.4 (*)    GFR calc non Af Amer 52 (*)    GFR calc Af Amer 60 (*)    All other components within normal limits  NOVEL CORONAVIRUS, NAA (HOSPITAL ORDER, SEND-OUT TO REF LAB)  ETHANOL  URINE DRUG SCREEN, QUALITATIVE ([facility] ONLY)  TROPONIN I (HIGH SENSITIVITY)   CRITICAL CARE Performed by: Laurence Aly   Total critical care time: 30 minutes  Critical care time was exclusive of separately billable procedures and treating other patients.  Critical care was necessary to treat or prevent imminent or life-threatening deterioration.  Critical care was time spent personally by me on  the following activities: development of treatment plan with patient and/or surrogate as well as nursing, discussions with consultants, evaluation of patient's response to treatment, examination of patient, obtaining history from patient or surrogate, ordering and performing treatments and interventions, ordering and review of laboratory studies, ordering and review of radiographic studies, pulse oximetry and re-evaluation of patient's condition.  RADIOLOGY Images were viewed by me  CT head, chest x-ray IMPRESSION:  Enlargement of cardiopericardial silhouette with vascular congestion  and interstitial pulmonary edema pattern.  IMPRESSION:  1. No evidence for acute intracranial abnormality.  2. Mild atrophy.  3. LEFT posterior parietal scalp edema.  ____________________________________________   DIFFERENTIAL DIAGNOSIS   Acute hypercarbic respiratory failure, CHF, COPD, intoxication, pneumonia, COVID-19, head injury  FINAL ASSESSMENT AND PLAN  Dizziness,  hypoxia, AKI, CHF   Plan: The patient had presented for dizziness with fall and noted hypoxia on arrival. Patient's labs did reveal some acute kidney injury with some acute on chronic CO2 retention for which she was given duo nebs. Patient's imaging reveals vascular congestion and enlargement of the cardiopericardial silhouette.  This appears to be acute congestive heart failure, will receive IV Lasix.  I will discuss with the hospitalist for admission.   Laurence Aly, MD    Note: This note was generated in part or whole with voice recognition software. Voice recognition is usually quite accurate but there are transcription errors that can and very often do occur. I apologize for any typographical errors that were not detected and corrected.     Earleen Newport, MD 01/20/19 1134    Earleen Newport, MD 01/20/19 [phone]

## 2019-01-20 NOTE — ED Notes (Addendum)
Pt O2 sat dropped to 85%- O2 increased to 6L - will continue to monitor

## 2019-01-20 NOTE — ED Triage Notes (Addendum)
Pt arrives via EMS from [facility] for complaint of dizziness and fall, c/o of hitting his head on the sink. History of liver cirrhosis, COPD, CHF and anxiety disorder. Reports taking oxycodone this morning. EMS reports low O2 Sat, and put pt on NRB.  80% on RA, placed on 2L [place]

## 2019-01-20 NOTE — ED Notes (Signed)
Date and time results received: 01/20/19 1155  Test: Troponin Critical Value: 208  Name of Provider Notified: Dr. Jimmye Norman

## 2019-01-20 NOTE — Progress Notes (Signed)
   [place] [facility] Day: 0 days Andrew Carey is a 68 y.o. male with a known history of alcohol abuse, liver cirrhosis, depression, COPD with ongoing tobacco abuse who presented to the hospital due to dizziness and weakness.  Given patient's multiple comorbidities and reversible conditions including COPD and liver cirrhosis discussed CODE STATUS with the patient and patient's sister at bedside.  Explained to them the difference between DNR and a full code.  Patient does not want any heroic measures to save his life does not want any cardiopulmonary resuscitation or intubation.  Advance care planning discussed with patient  with additional Family at bedside. All questions in regards to overall condition and expected prognosis answered. The decision was made to continue current code status  CODE STATUS: dnr Time spent: 16 minutes

## 2019-01-21 ENCOUNTER — Inpatient Hospital Stay: Payer: Medicare Other

## 2019-01-21 ENCOUNTER — Inpatient Hospital Stay (HOSPITAL_COMMUNITY)
Admit: 2019-01-21 | Discharge: 2019-01-21 | Disposition: A | Discharge status: Home / Self Care | Payer: Medicare Other | Attending: Specialist | Admitting: Specialist

## 2019-01-21 DIAGNOSIS — I361 Nonrheumatic tricuspid (valve) insufficiency: Secondary | ICD-10-CM

## 2019-01-21 LAB — BASIC METABOLIC PANEL
Anion gap: 9 (ref 5–15)
BUN: 22 mg/dL (ref 8–23)
CO2: 31 mmol/L (ref 22–32)
Calcium: 8.3 mg/dL — ABNORMAL LOW (ref 8.9–10.3)
Chloride: 98 mmol/L (ref 98–111)
Creatinine, Ser: 1 mg/dL (ref 0.61–1.24)
GFR calc Af Amer: >60 mL/min (ref >60)
GFR calc non Af Amer: >60 mL/min (ref >60)
Glucose, Bld: 129 mg/dL — ABNORMAL HIGH (ref 70–99)
Potassium: 4 mmol/L (ref 3.5–5.1)
Sodium: 138 mmol/L (ref 135–145)

## 2019-01-21 LAB — TROPONIN I (HIGH SENSITIVITY)
Troponin I (High Sensitivity): 176 ng/L (ref <18)
Troponin I (High Sensitivity): 184 ng/L (ref <18)

## 2019-01-21 LAB — CBC
HCT: 46.3 % (ref 39.0–52.0)
Hemoglobin: 15.1 g/dL (ref 13.0–17.0)
MCH: 32.3 pg (ref 26.0–34.0)
MCHC: 32.6 g/dL (ref 30.0–36.0)
MCV: 98.9 fL (ref 80.0–100.0)
Platelets: 159 10*3/uL (ref 150–400)
RBC: 4.68 MIL/uL (ref 4.22–5.81)
RDW: 15.5 % (ref 11.5–15.5)
WBC: 9.1 10*3/uL (ref 4.0–10.5)
nRBC: 0 % (ref 0.0–0.2)

## 2019-01-21 LAB — ECHOCARDIOGRAM COMPLETE
Height: 72 in
Weight: 4323.2 oz

## 2019-01-21 MED ORDER — LORAZEPAM 2 MG/ML IJ SOLN
0.0000 mg | Freq: Two times a day (BID) | INTRAMUSCULAR | Status: DC
  Administered 2019-01-24: 2 mg via INTRAVENOUS
  Filled 2019-01-21: qty 1

## 2019-01-21 MED ORDER — ENSURE ENLIVE PO LIQD
237.0000 mL | Freq: Two times a day (BID) | ORAL | Status: DC
  Administered 2019-01-21 – 2019-01-25 (×6): 237 mL via ORAL

## 2019-01-21 MED ORDER — THIAMINE HCL 100 MG/ML IJ SOLN
100.0000 mg | Freq: Every day | INTRAMUSCULAR | Status: DC
  Filled 2019-01-21 (×2): qty 2

## 2019-01-21 MED ORDER — SALINE SPRAY 0.65 % NA SOLN
1.0000 | NASAL | Status: DC | PRN
  Filled 2019-01-21: qty 44

## 2019-01-21 MED ORDER — LORAZEPAM 1 MG PO TABS
1.0000 mg | ORAL_TABLET | Freq: Four times a day (QID) | ORAL | Status: AC | PRN
  Administered 2019-01-22 – 2019-01-24 (×3): 1 mg via ORAL
  Filled 2019-01-21 (×3): qty 1

## 2019-01-21 MED ORDER — ADULT MULTIVITAMIN W/MINERALS CH: 1.0000 | ORAL_TABLET | Freq: Every day | ORAL | Status: DC

## 2019-01-21 MED ORDER — LORAZEPAM 2 MG/ML IJ SOLN
1.0000 mg | Freq: Four times a day (QID) | INTRAMUSCULAR | Status: AC | PRN
  Administered 2019-01-22 (×2): 1 mg via INTRAVENOUS
  Filled 2019-01-21 (×2): qty 1

## 2019-01-21 MED ORDER — OXYCODONE HCL 5 MG PO TABS
5.0000 mg | ORAL_TABLET | ORAL | Status: DC | PRN
  Administered 2019-01-21 – 2019-01-22 (×4): 5 mg via ORAL
  Filled 2019-01-21 (×4): qty 1

## 2019-01-21 MED ORDER — FOLIC ACID 1 MG PO TABS
1.0000 mg | ORAL_TABLET | Freq: Every day | ORAL | Status: DC
  Administered 2019-01-22 – 2019-01-25 (×4): 1 mg via ORAL
  Filled 2019-01-21 (×4): qty 1

## 2019-01-21 MED ORDER — LORAZEPAM 2 MG/ML IJ SOLN
0.0000 mg | Freq: Four times a day (QID) | INTRAMUSCULAR | Status: AC
  Administered 2019-01-21: 2 mg via INTRAVENOUS
  Administered 2019-01-22: 1 mg via INTRAVENOUS
  Administered 2019-01-23: 2 mg via INTRAVENOUS
  Filled 2019-01-21 (×3): qty 1

## 2019-01-21 MED ORDER — ADULT MULTIVITAMIN W/MINERALS CH
1.0000 | ORAL_TABLET | Freq: Every day | ORAL | Status: DC
  Administered 2019-01-22 – 2019-01-25 (×4): 1 via ORAL
  Filled 2019-01-21 (×5): qty 1

## 2019-01-21 MED ORDER — NICOTINE 21 MG/24HR TD PT24
21.0000 mg | MEDICATED_PATCH | Freq: Every day | TRANSDERMAL | Status: DC
  Administered 2019-01-21 – 2019-01-25 (×5): 21 mg via TRANSDERMAL
  Filled 2019-01-21 (×5): qty 1

## 2019-01-21 MED ORDER — VITAMIN B-1 100 MG PO TABS
100.0000 mg | ORAL_TABLET | Freq: Every day | ORAL | Status: DC
  Administered 2019-01-22 – 2019-01-25 (×4): 100 mg via ORAL
  Filled 2019-01-21 (×4): qty 1

## 2019-01-21 NOTE — Evaluation (Signed)
Physical Therapy Evaluation Patient Details Name: Andrew Carey MRN: [id number] DOB: 03-11-1951 Today's Date: 01/21/2019   History of Present Illness  From MD H&P: Pt is a 68 y.o. male with a known history of alcohol abuse, liver cirrhosis, depression, COPD with ongoing tobacco abuse who presented to the hospital due to dizziness and weakness.  He has noticed increase in his abdominal girth and also increasing lower extremity edema and is not able to carry his activities of daily living and had a fall today and therefore came to the ER for further evaluation.  Patient was noted to be hypoxic with room air O2 sats in the low 80s and thought to have acute respiratory failure with hypoxia secondary to CHF and also underlying COPD and therefore hospitalist services were contacted for admission.  MD assessment includes: Acute respiratory failure with hypoxia, CHF, elevated troponin secondary to demand ischemia, COPD exacerbation, liver cirrhosis, and neuropathy.    Clinical Impression  Pt presents with deficits in strength, transfers, mobility, gait, balance, and activity tolerance.  Pt required min A with bed mobility tasks and required the EOB to be elevated in order to come to standing.  Pt was only able to take several small steps near the EOB for safety with heavy lean on the RW for support.  Pt's SpO2 dropped on 2LO2/min to the mid 80s after amb but increased back to >/= 90% after returning to sitting with cues for PLB.  Pt will benefit from PT services in a SNF setting upon discharge to safely address above deficits for decreased caregiver assistance and eventual return to PLOF.      Follow Up Recommendations SNF;Supervision for mobility/OOB    Equipment Recommendations  Other (comment);Rolling walker with 5" wheels(TBD at next venue of care if discharges to a SNF)    Recommendations for Other Services       Precautions / Restrictions Precautions Precautions:  Fall Restrictions Weight Bearing Restrictions: No      Mobility  Bed Mobility Overal bed mobility: Needs Assistance Bed Mobility: Supine to Sit     Supine to sit: Min assist     General bed mobility comments: Min A for BLEs out of bed and for trunk to full upright position  Transfers Overall transfer level: Needs assistance Equipment used: Rolling walker (2 wheeled) Transfers: Sit to/from Stand Sit to Stand: Min assist;From elevated surface         General transfer comment: Min A for stability with pt requiring the EOB to be elevated in order to stand  Ambulation/Gait Ambulation/Gait assistance: Min guard Gait Distance (Feet): 3 Feet Assistive device: Rolling walker (2 wheeled) Gait Pattern/deviations: Step-to pattern;Trunk flexed;Decreased step length - left;Decreased step length - right Gait velocity: decreased   General Gait Details: Slow, effortful steps near the EOB for safety with heavy lean on the RW for support  Stairs            Wheelchair Mobility    Modified Rankin (Stroke Patients Only)       Balance Overall balance assessment: Needs assistance Sitting-balance support: Bilateral upper extremity supported;Feet supported Sitting balance-Leahy Scale: Good     Standing balance support: Bilateral upper extremity supported Standing balance-Leahy Scale: Fair Standing balance comment: Heavy lean on the RW for support                             Pertinent Vitals/Pain Pain Assessment: 0-10 Pain Score: 4  Pain Location: R  knee and back, chronic per patient Pain Descriptors / Indicators: Sore Pain Intervention(s): Premedicated before session;Monitored during session    Home Living Family/patient expects to be discharged to:: Other (Comment)                 Additional Comments: Pt is living in a motel; has siblings that can help intermittently    Prior Function Level of Independence: Independent         Comments: Ind  amb limited community distances without an AD, 4 falls in the last 6 months secondary to LOB, Ind with ADLs     Hand Dominance        Extremity/Trunk Assessment   Upper Extremity Assessment Upper Extremity Assessment: Generalized weakness    Lower Extremity Assessment Lower Extremity Assessment: Generalized weakness       Communication   Communication: No difficulties  Cognition Arousal/Alertness: Lethargic Behavior During Therapy: WFL for tasks assessed/performed Overall Cognitive Status: Within Functional Limits for tasks assessed                                        General Comments      Exercises Total Joint Exercises Ankle Circles/Pumps: Strengthening;Both;5 reps;10 reps Quad Sets: Strengthening;Both;5 reps;10 reps Gluteal Sets: Strengthening;Both;5 reps;10 reps Heel Slides: AAROM;Both;5 reps Hip ABduction/ADduction: AAROM;Both;5 reps Straight Leg Raises: AAROM;5 reps;Both Long Arc Quad: AROM;10 reps;5 reps;Both Knee Flexion: AROM;Both;5 reps;10 reps Marching in Standing: AROM;Both;5 reps;Standing Other Exercises Other Exercises: HEP education/review for BLE APs, QS, GS, and LAQs x 10 each 5-6x/day   Assessment/Plan    PT Assessment Patient needs continued PT services  PT Problem List Decreased strength;Decreased balance;Decreased activity tolerance;Decreased mobility;Decreased knowledge of use of DME       PT Treatment Interventions DME instruction;Gait training;Functional mobility training;Therapeutic activities;Therapeutic exercise;Balance training;Patient/family education    PT Goals (Current goals can be found in the Care Plan section)  Acute Rehab PT Goals Patient Stated Goal: To get stronger PT Goal Formulation: With patient Time For Goal Achievement: 02/03/19 Potential to Achieve Goals: Good    Frequency Min 2X/week   Barriers to discharge Decreased caregiver support      Co-evaluation               AM-PAC PT "6  Clicks" Mobility  Outcome Measure Help needed turning from your back to your side while in a flat bed without using bedrails?: A Little Help needed moving from lying on your back to sitting on the side of a flat bed without using bedrails?: A Little Help needed moving to and from a bed to a chair (including a wheelchair)?: A Little Help needed standing up from a chair using your arms (e.g., wheelchair or bedside chair)?: A Little Help needed to walk in hospital room?: A Lot Help needed climbing 3-5 steps with a railing? : Total 6 Click Score: 15    End of Session Equipment Utilized During Treatment: Gait belt;Oxygen Activity Tolerance: Patient tolerated treatment well Patient left: in chair;with call bell/phone within reach;with chair alarm set Nurse Communication: Mobility status PT Visit Diagnosis: History of falling (Z91.81);Difficulty in walking, not elsewhere classified (R26.2);Muscle weakness (generalized) (M62.81)    Time: [id number] PT Time Calculation (min) (ACUTE ONLY): 29 min   Charges:   PT Evaluation $PT Eval Moderate Complexity: 1 Mod PT Treatments $Therapeutic Exercise: 8-22 mins        D. Royetta Asal PT, DPT  01/21/19, 1:00 PM

## 2019-01-21 NOTE — Plan of Care (Signed)
Nutrition Education Note  RD consulted for nutrition education regarding CHF.  68 year old male with past medical history of alcohol abuse, liver cirrhosis, anxiety, COPD with ongoing tobacco abuse who presents to the hospital due to weakness, dizziness and noted to be in acute respiratory failure with hypoxia.  RD provided "Low Sodium Nutrition Therapy" handout from the Academy [employer]. Reviewed patient's dietary recall. Provided examples on ways to decrease sodium intake in diet. Discouraged intake of processed foods and use of salt shaker. Encouraged fresh fruits and vegetables as well as whole grain sources of carbohydrates to maximize fiber intake.   RD discussed why it is important for patient to adhere to diet recommendations, and emphasized the role of fluids, foods to avoid, and importance of weighing self daily. Teach back method used.  Expect poor compliance.  Body mass index is 36.65 kg/m. Pt meets criteria for overweight based on current BMI.  Current diet order is HH, patient is consuming approximately 100% of meals at this time. Labs and medications reviewed.   RD will add Ensure Enlive po BID and MVI daily in the setting of etoh abuse and cirrhosis.   No further nutrition interventions warranted at this time. RD contact information provided. If additional nutrition issues arise, please re-consult RD.   Koleen Distance MS, RD, LDN Pager #- [phone] Office#- [phone] After Hours Pager: [phone]

## 2019-01-21 NOTE — Progress Notes (Addendum)
[facility] [place] NAME: Andrew Carey    MR#:  [id number]  DATE OF BIRTH:  [birth year]  SUBJECTIVE:   Patient admitted to the hospital yesterday due to dizziness, shortness of breath and noted to be in acute respiratory failure with hypoxia secondary to COPD and CHF.  Diuresing well with IV Lasix and feels better.  Still has some generalized weakness and swelling.  REVIEW OF SYSTEMS:    Review of Systems  Constitutional: Negative for chills and fever.  HENT: Negative for congestion and tinnitus.   Eyes: Negative for blurred vision and double vision.  Respiratory: Positive for shortness of breath. Negative for cough and wheezing.   Cardiovascular: Positive for leg swelling. Negative for chest pain, orthopnea and PND.  Gastrointestinal: Negative for abdominal pain, diarrhea, nausea and vomiting.  Genitourinary: Negative for dysuria and hematuria.  Neurological: Positive for weakness (generalized). Negative for dizziness, sensory change and focal weakness.  All other systems reviewed and are negative.   Nutrition: 2 gram sodium.  Tolerating Diet: Yes Tolerating PT: Eval noted.   DRUG ALLERGIES:   Allergies  Allergen Reactions  . Fluoxetine     Hallucinations  . Codeine Nausea And Vomiting and Nausea Only  . Abilify [Aripiprazole]     Anxiety and panic attacks  . Trazodone And Nefazodone     Vivid dreams    VITALS:  Blood pressure 116/79, pulse 86, temperature 97.7 F (36.5 C), temperature source Oral, resp. rate 18, height 6' (1.829 m), weight 122.6 kg, SpO2 92 %.  PHYSICAL EXAMINATION:   Physical Exam  GENERAL:  68 y.o.-year-old patient sitting up in chair in no acute distress.  EYES: Pupils equal, round, reactive to light and accommodation. No scleral icterus. Extraocular muscles intact.  HEENT: Head atraumatic, normocephalic. Oropharynx and nasopharynx clear.  NECK:  Supple, no jugular venous distention. No thyroid  enlargement, no tenderness.  LUNGS: Good A/E b/l,  Minimal end-exp. Wheezing b/l, bibasilar rales, No rhonchi. No use of accessory muscles of respiration.  CARDIOVASCULAR: S1, S2 normal. No murmurs, rubs, or gallops.  ABDOMEN: Soft, nontender, nondistended. Bowel sounds present. No organomegaly or mass.  EXTREMITIES: No cyanosis, clubbing, + 1-2 edema b/l.  NEUROLOGIC: Cranial nerves II through XII are intact. No focal Motor or sensory deficits b/l. Globally weak.    PSYCHIATRIC: The patient is alert and oriented x 3.  SKIN: No obvious rash, lesion, or ulcer.    LABORATORY PANEL:   CBC Recent Labs  Lab 01/21/19 0607  WBC 9.1  HGB 15.1  HCT 46.3  PLT 159   ------------------------------------------------------------------------------------------------------------------  Chemistries  Recent Labs  Lab 01/20/19 1019 01/21/19 0607  NA 132* 138  K 4.0 4.0  CL 93* 98  CO2 29 31  GLUCOSE 128* 129*  BUN 26* 22  CREATININE 1.41* 1.00  CALCIUM 8.5* 8.3*  AST 27  --   ALT 26  --   ALKPHOS 104  --   BILITOT 0.9  --    ------------------------------------------------------------------------------------------------------------------  Cardiac Enzymes No results for input(s): TROPONINI in the last 168 hours. ------------------------------------------------------------------------------------------------------------------  RADIOLOGY:  Ct Head Wo Contrast  Result Date: 01/20/2019 CLINICAL DATA:  Pt arrives via EMS from [facility] for complaint of dizziness and fall, c/o of hitting his head on the sink. History of liver cirrhosis, COPD, CHF and anxiety disorder. Reports taking oxycodone this morning. EXAM: CT HEAD WITHOUT CONTRAST TECHNIQUE: Contiguous axial images were obtained from the base of the skull  through the vertex without intravenous contrast. COMPARISON:  08/21/2006 FINDINGS: Brain: There is mild central and cortical atrophy. There is no intra or extra-axial fluid  collection or mass lesion. The basilar cisterns and ventricles have a normal appearance. There is no CT evidence for acute infarction or hemorrhage. Vascular: There is atherosclerotic calcification of the internal carotid arteries. No hyperdense vessels. Skull: Normal. Negative for fracture or focal lesion. Sinuses/Orbits: No acute finding. Other: There is focal scalp edema in the LEFT posterior parietal region without underlying calvarial fracture. IMPRESSION: 1. No evidence for acute intracranial abnormality. 2. Mild atrophy. 3. LEFT posterior parietal scalp edema. Electronically Signed   By: Nolon Nations M.D.   On: 01/20/2019 11:05   US Abdomen Limited  Result Date: 01/21/2019 CLINICAL DATA:  Abdominal distention.  Evaluate for ascites. EXAM: LIMITED ABDOMEN ULTRASOUND FOR ASCITES TECHNIQUE: Limited ultrasound survey for ascites was performed in all four abdominal quadrants. COMPARISON:  01/11/2016 FINDINGS: No ascites.  Fluid in the urinary bladder. IMPRESSION: Negative for ascites. Electronically Signed   By: Markus Daft M.D.   On: 01/21/2019 14:54   Dg Chest Port 1 View  Result Date: 01/20/2019 CLINICAL DATA:  Dizziness and fall. EXAM: PORTABLE CHEST 1 VIEW COMPARISON:  06/02/2017 FINDINGS: The cardio pericardial silhouette is enlarged. There is pulmonary vascular congestion without overt pulmonary edema. Diffuse interstitial opacity likely related to edema. No substantial pleural effusion. The visualized bony structures of the thorax are intact. Old right rib fractures again noted. Telemetry leads overlie the chest. IMPRESSION: Enlargement of cardiopericardial silhouette with vascular congestion and interstitial pulmonary edema pattern. Electronically Signed   By: Misty Stanley M.D.   On: 01/20/2019 10:45     ASSESSMENT AND PLAN:   68 year old male with past medical history of alcohol abuse, liver cirrhosis, anxiety, COPD with ongoing tobacco abuse who presents to the hospital due to  weakness, dizziness and noted to be in acute respiratory failure with hypoxia.  1.  Acute respiratory failure with hypoxia- secondary to a combination of CHF and also underlying COPD. -Continue O2 supplementation, will treat patient's underlying COPD with scheduled duo nebs, Pulmicort nebs.  - cont. IV lasix. Wean off O2 as tolerated.    2.  CHF- Acute systolic CHF.  Echo showing EF of 30-35%.    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 -Continue diuresis with IV Lasix, patient's about 2 L negative since admission.  Responding well. - cont. Aldactone.  - follow I's and O's and daily weights.  3.  Elevated troponin-secondary to supply demand ischemia from hypoxemia and also underlying CHF. - Trend Serial Trop.  Echocardiogram showing EF of 35 to 40%.  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  4.  COPD-mild acute exacerbation secondary to ongoing tobacco abuse.  Patient's chest x-ray does not show any evidence of pneumonia just mild CHF. - cont. Duonebs, Pulmicort nebs.   -We will assess the patient for home oxygen prior to discharge.  5.  Liver cirrhosis-secondary to alcohol abuse. -No evidence of hepatic encephalopathy presently and cont. Lactulose.   - US showing no ascites today and therefore no need for Paracentesis.  - Continue diuresis with Lasix, cont. Aldactone.  6.  Neuropathy-continue gabapentin.  7.  Anxiety-continue Xanax.  8.  Chronic pain-continue oxycodone.  9. Tobacco abuse - cont. Nicotine patch.   PT evaluation noted and patient will need short-term rehab upon discharge and patient is  in agreement.   All the records are reviewed and case discussed with Care Management/Social Worker. Management plans discussed with the patient, family and they are in agreement.  CODE STATUS: DNR  DVT Prophylaxis: Lovenox  TOTAL TIME TAKING CARE  OF THIS PATIENT: 30 minutes.   POSSIBLE D/C IN 1-2 DAYS, DEPENDING ON CLINICAL CONDITION.   Henreitta Leber M.D on 01/21/2019 at 3:04 PM  Between 7am to 6pm - Pager - [phone]  After 6pm go to [email or web address] - Proofreader  [facility]  Office  [phone]  CC: Primary care physician; Birdie Sons, MD

## 2019-01-21 NOTE — Progress Notes (Signed)
*  PRELIMINARY RESULTS* Echocardiogram 2D Echocardiogram has been performed.  Andrew Carey  01/21/2019, 9:58 AM

## 2019-01-22 LAB — BASIC METABOLIC PANEL
Anion gap: 10 (ref 5–15)
BUN: 19 mg/dL (ref 8–23)
CO2: 32 mmol/L (ref 22–32)
Calcium: 8.6 mg/dL — ABNORMAL LOW (ref 8.9–10.3)
Chloride: 96 mmol/L — ABNORMAL LOW (ref 98–111)
Creatinine, Ser: 0.72 mg/dL (ref 0.61–1.24)
GFR calc Af Amer: >60 mL/min (ref >60)
GFR calc non Af Amer: >60 mL/min (ref >60)
Glucose, Bld: 129 mg/dL — ABNORMAL HIGH (ref 70–99)
Potassium: 3.9 mmol/L (ref 3.5–5.1)
Sodium: 138 mmol/L (ref 135–145)

## 2019-01-22 LAB — BLOOD GAS, ARTERIAL
Acid-Base Excess: 16.1 mmol/L — ABNORMAL HIGH (ref 0.0–2.0)
Bicarbonate: 44.4 mmol/L — ABNORMAL HIGH (ref 20.0–28.0)
FIO2: 40
O2 Saturation: 89.5 %
Patient temperature: 37
pCO2 arterial: 70 mmHg (ref 32.0–48.0)
pH, Arterial: 7.41 (ref 7.350–7.450)
pO2, Arterial: 57 mmHg — ABNORMAL LOW (ref 83.0–108.0)

## 2019-01-22 LAB — HIV ANTIBODY (ROUTINE TESTING W REFLEX): HIV Screen 4th Generation wRfx: NONREACTIVE

## 2019-01-22 LAB — NOVEL CORONAVIRUS, NAA (HOSP ORDER, SEND-OUT TO REF LAB; TAT 18-24 HRS): SARS-CoV-2, NAA: NOT DETECTED

## 2019-01-22 MED ORDER — METHYLPREDNISOLONE SODIUM SUCC 40 MG IJ SOLR
40.0000 mg | Freq: Two times a day (BID) | INTRAMUSCULAR | Status: DC
  Administered 2019-01-22 – 2019-01-23 (×3): 40 mg via INTRAVENOUS
  Filled 2019-01-22 (×3): qty 1

## 2019-01-22 MED ORDER — FLUTICASONE PROPIONATE 50 MCG/ACT NA SUSP
1.0000 | Freq: Every day | NASAL | Status: DC
  Administered 2019-01-22 – 2019-01-25 (×5): 1 via NASAL
  Filled 2019-01-22: qty 16

## 2019-01-22 NOTE — Progress Notes (Signed)
Cardiovascular and Pulmonary Nurse Navigator Note:    Attempted to see patient to provide CHF education. Patient lying on right side sound asleep.  Oxygen via [place] in use.  Respirations even and non-labored.   Will round on patient at a later time.    CHF Checklist:  Patient on I/O, Daily Weights ordered; Patient on 2 gm NA diet.  Dietitian Consultation completed for Diet Education; no specific fluid restriction ordered - therefore fluid restriction of 64 ounces per day.  EF 30 - 35% per echo.  CHF patient education pharmacy monitoring consult ordered by this RN.  PT is recommending SNF.  New patient appointment in the [facility] on 01/28/2019 at 9 a.m.    Roanna Epley, RN, BSN, [facility]  Cardiovascular & Pulmonary Nurse Navigator  Direct Line: [phone]  Department Phone #: [phone] Fax: [phone]  Email Address: [email or web address]

## 2019-01-22 NOTE — Progress Notes (Signed)
Cardiovascular and Pulmonary Nurse Navigator Note:    68 year old male with known hx of alcohol abuse, liver cirrhosis, anxiety, depression, COPD with ongoing tobacco abuse who presented to the hospital with c/o dizziness and weakness.  Patient of abdominal swelling and lower extremity edema.  Patient noted to have low oxygen sats on RA in the low 80s.  Patient admitted with dx of acute respiratory failure with hypoxia secondary to CHF and COPD.  Patient is now on 3 liters of oxygen sating 91%.  BNP 1,324.  CXR:  IMPRESSION: Enlargement of cardiopericardial silhouette with vascular congestion and interstitial pulmonary edema pattern.  CHF Education: Patient's sister, Florentina Jenny is at bedside.  Patient informed this RN that his sister is his HCPOA and financial POA.  Patient gave this RN verbal permission to speak about his healthcare in front of his sister.    Provided patient with "Living Better with Heart Failure" packet. Briefly reviewed definition of heart failure and signs and symptoms of an exacerbation.?Discussed potential causes of CHF.  Explained to patient that HF is a chronic illness which requires self-assessment / self-management along with help from the cardiologist/PCP.?  *Reviewed importance of and reason behind checking weight daily in the AM, after using the bathroom, but before getting dressed. Patient has functioning scale.    *Reviewed with patient the following information: *Discussed when to call the Dr= weight gain of >2-3lb overnight of 5lb in a week, *Discussed yellow zone= call MD: weight gain of >2-3lb overnight of 5lb in a week, increased swelling, increased SOB when lying down, chest discomfort, dizziness, increased fatigue *Red Zone= call 911: struggle to breath, fainting or near fainting, significant chest pain ?  *Heart Failure Zone Magnet given and reviewed with patient.   *Diet - Patient currently ordered 2 gm sodium diet.  Referral for Dietitian Consultation for diet  education has been ordered and was completed yesterday. Instructed patient to follow a low sodium diet of 2000 mg or less.  Recommended foods for low sodium heart healthy diet.    *Discussed fluid intake with patient as well. Patient not currently on a fluid restriction, but advised no more than 64 ounces of fluid per day.?Demonstrated this volume to patient using the bedside water pitcher.   *Instructed patient to take medications as prescribed for heart failure. Explained briefly why patient is on the medications (either make you feel better, live longer or keep you out of the hospital) and discussed monitoring and side effects.  *Discussed activity - Physical Therapy evaluation recommending SNF.  TOC team aware.   ? *Smoking Cessation- Patient is a CURRENT smoker. "Thinking about Quitting - Yes, You Can" handout given to patient and sister.   ? *[facility] - Explained the purpose of the HF Clinic. ?Explained to patient the HF Clinic does not replace PCP nor Cardiologist, but is an additional resource to helping patient manage heart failure at home. New patient appointment scheduled for January 28, 2019 at 9:00 a.m.   ? Patient's sister wanting an update from patient's doctor.   Sister also asking to speak with [name] with Hospice.  Dr. Verdell Carmine sent a secure chat informing him that Florentina Jenny, patient's sister, would like an update from him.  I also informed Dr. Verdell Carmine that Florentina Jenny would also like to speak with [name] as well.    Roanna Epley, RN, BSN, [facility]  Cardiovascular & Pulmonary Nurse Navigator  Direct Line: [phone]  Department Phone #: [phone] Fax: [phone]  Email Address: [email or web address]

## 2019-01-22 NOTE — Progress Notes (Signed)
[facility] [place] NAME: Andrew Carey    MR#:  [id number]  DATE OF BIRTH:  11-Mar-1951  SUBJECTIVE:   Patient somewhat lethargic and subdued but received some Ativan for alcohol withdrawal overnight.  Still follows simple commands.  Respiratory status is stable.  REVIEW OF SYSTEMS:    Review of Systems  Constitutional: Negative for chills and fever.  HENT: Negative for congestion and tinnitus.   Eyes: Negative for blurred vision and double vision.  Respiratory: Positive for shortness of breath. Negative for cough and wheezing.   Cardiovascular: Positive for leg swelling. Negative for chest pain, orthopnea and PND.  Gastrointestinal: Negative for abdominal pain, diarrhea, nausea and vomiting.  Genitourinary: Negative for dysuria and hematuria.  Neurological: Positive for weakness (generalized). Negative for dizziness, sensory change and focal weakness.  All other systems reviewed and are negative.   Nutrition: 2 gram sodium.  Tolerating Diet: Yes Tolerating PT: Eval noted.   DRUG ALLERGIES:   Allergies  Allergen Reactions  . Fluoxetine     Hallucinations  . Codeine Nausea And Vomiting and Nausea Only  . Abilify [Aripiprazole]     Anxiety and panic attacks  . Trazodone And Nefazodone     Vivid dreams    VITALS:  Blood pressure 109/76, pulse 81, temperature 98.5 F (36.9 C), temperature source Oral, resp. rate 20, height 6' (1.829 m), weight 122.5 kg, SpO2 91 %.  PHYSICAL EXAMINATION:   Physical Exam  GENERAL:  68 y.o.-year-old patient lying in bed lethargic but follows commands.  EYES: Pupils equal, round, reactive to light and accommodation. No scleral icterus. Extraocular muscles intact.  HEENT: Head atraumatic, normocephalic. Oropharynx and nasopharynx clear.  NECK:  Supple, no jugular venous distention. No thyroid enlargement, no tenderness.  LUNGS: Poor Resp. effort,  Minimal end-exp. Wheezing b/l, bibasilar rales,  No rhonchi. No use of accessory muscles of respiration.  CARDIOVASCULAR: S1, S2 normal. No murmurs, rubs, or gallops.  ABDOMEN: Soft, nontender, nondistended. Bowel sounds present. No organomegaly or mass.  EXTREMITIES: No cyanosis, clubbing, + 1-2 edema b/l.  NEUROLOGIC: Cranial nerves II through XII are intact. No focal Motor or sensory deficits b/l. Globally weak.    PSYCHIATRIC: The patient is alert and oriented x 3.  SKIN: No obvious rash, lesion, or ulcer.    LABORATORY PANEL:   CBC Recent Labs  Lab 01/21/19 0607  WBC 9.1  HGB 15.1  HCT 46.3  PLT 159   ------------------------------------------------------------------------------------------------------------------  Chemistries  Recent Labs  Lab 01/20/19 1019  01/22/19 0527  NA 132*   < > 138  K 4.0   < > 3.9  CL 93*   < > 96*  CO2 29   < > 32  GLUCOSE 128*   < > 129*  BUN 26*   < > 19  CREATININE 1.41*   < > 0.72  CALCIUM 8.5*   < > 8.6*  AST 27  --   --   ALT 26  --   --   ALKPHOS 104  --   --   BILITOT 0.9  --   --    < > = values in this interval not displayed.   ------------------------------------------------------------------------------------------------------------------  Cardiac Enzymes No results for input(s): TROPONINI in the last 168 hours. ------------------------------------------------------------------------------------------------------------------  RADIOLOGY:  US Abdomen Limited  Result Date: 01/21/2019 CLINICAL DATA:  Abdominal distention.  Evaluate for ascites. EXAM: LIMITED ABDOMEN ULTRASOUND FOR ASCITES TECHNIQUE: Limited ultrasound survey for ascites was performed  in all four abdominal quadrants. COMPARISON:  01/11/2016 FINDINGS: No ascites.  Fluid in the urinary bladder. IMPRESSION: Negative for ascites. Electronically Signed   By: Markus Daft M.D.   On: 01/21/2019 14:54     ASSESSMENT AND PLAN:   68 year old male with past medical history of alcohol abuse, liver cirrhosis,  anxiety, COPD with ongoing tobacco abuse who presents to the hospital due to weakness, dizziness and noted to be in acute respiratory failure with hypoxia.  1.  Acute respiratory failure with hypoxia- secondary to a combination of CHF and also underlying COPD. -Continue O2 supplementation  -Continue IV steroids, scheduled duo nebs, Pulmicort nebs for the COPD.  Continue diuresis with IV Lasix.  Wean oxygen as tolerated.  2.  CHF- Acute systolic CHF.  Echo showing EF of 30-35%.     - due to alcoholic related dilated cardiomyopathy. -Continue diuresis with IV Lasix, patient's about 3 L negative since admission.  Responding well. - cont. Aldactone.  - follow I's and O's and daily weights.  3.  Elevated troponin-secondary to supply demand ischemia from hypoxemia and also underlying CHF. - Trend Serial Trop.  Echocardiogram showing EF of 35 to 40%.  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  4.  COPD-mild acute exacerbation secondary to ongoing tobacco abuse.  Patient's chest x-ray does not show any evidence of pneumonia just mild CHF. -  Started on some IV steroids, cont. Duonebs, Pulmicort nebs.   -We will assess the patient for home oxygen prior to discharge. -ABG showing some hypercapnia but pH is normal.  We will continue to monitor.  If needed would consider BiPAP.  5.  Liver cirrhosis-secondary to alcohol abuse. -No evidence of hepatic encephalopathy presently and cont. Lactulose.   - US showing no ascites yesterday and therefore no need for Paracentesis.  - Continue diuresis with Lasix, cont. Aldactone.  6.  Neuropathy-continue gabapentin.  7.  Anxiety-continue Xanax.  8.  Chronic pain-continue oxycodone.  9. Tobacco abuse - cont. Nicotine patch.   PT eval noted and patient will likely need short-term rehab upon discharge.  Patient is in agreement.  Discussed plan of care  with patient's sister who is the POA over the phone and she is in agreement.  Social work looking into [facility] for rehab.   All the records are reviewed and case discussed with Care Management/Social Worker. Management plans discussed with the patient, family and they are in agreement.  CODE STATUS: DNR  DVT Prophylaxis: Lovenox  TOTAL TIME TAKING CARE OF THIS PATIENT: 35 minutes.   POSSIBLE D/C IN 1-2 DAYS, DEPENDING ON CLINICAL CONDITION.   Henreitta Leber M.D on 01/22/2019 at 2:39 PM  Between 7am to 6pm - Pager - [phone]  After 6pm go to [email or web address] - Proofreader  [facility]  Office  [phone]  CC: Primary care physician; Birdie Sons, MD

## 2019-01-22 NOTE — NC FL2 (Signed)
[place] LEVEL OF CARE SCREENING TOOL     IDENTIFICATION  Patient Name: Andrew Carey Birthdate: 1950/07/21 Sex: male Admission Date (Current Location): 01/20/2019  [place] and [place] Number:  Engineering geologist and Address:  [facility], [address], [place], [place] [address]      Provider Number: [id number]  Attending Physician Name and Address:  Henreitta Leber, MD  Relative Name and Phone Number:  Nolene Bernheim [phone] or Karl Ito Sister   [phone]    Current Level of Care: Hospital Recommended Level of Care: [place] Prior Approval Number:    Date Approved/Denied:   PASRR Number: Pending  Discharge Plan: SNF    Current Diagnoses: Patient Active Problem List   Diagnosis Date Noted  . CHF (congestive heart failure) ([place]) 01/20/2019  . Uncomplicated alcohol dependence ([place])   . Tobacco use disorder   . Severe recurrent major depression without psychotic features ([place]) 06/02/2017  . Benzodiazepine overdose 06/02/2017  . Moderate recurrent major depression ([place]) 02/17/2017  . Adjustment disorder with mixed anxiety and depressed mood 02/17/2017  . Alcohol abuse 02/17/2017  . Elevated coronary artery calcium score 12/22/2016  . GAD (generalized anxiety disorder) 06/14/2016  . Chronic joint pain 02/16/2016  . Pruritic condition 02/10/2016  . Panic disorder 02/10/2016  . Alcoholic cirrhosis of liver with ascites ([place]) 02/10/2016  . Hypotension due to drugs 01/19/2016  . Abdominal pain 01/14/2016  . Encephalopathy, hepatic ([place]) 01/14/2016    Orientation RESPIRATION BLADDER Height & Weight     Self, Time, Situation, Place  O2(3L) Continent Weight: 270 lb 1 oz (122.5 kg) Height:  6' (182.9 cm)  BEHAVIORAL SYMPTOMS/MOOD NEUROLOGICAL BOWEL NUTRITION STATUS      Continent Diet(2g sodium diet)  AMBULATORY STATUS COMMUNICATION OF NEEDS Skin   Limited Assist Verbally                        Personal Care Assistance Level of Assistance  Bathing, Dressing, Feeding Bathing Assistance: Limited assistance Feeding assistance: Independent Dressing Assistance: Limited assistance     Functional Limitations Info  Sight, Speech, Hearing Sight Info: Adequate Hearing Info: Adequate Speech Info: Adequate    SPECIAL CARE FACTORS FREQUENCY  PT (By licensed PT), OT (By licensed OT)     PT Frequency: Minimum 5x a week OT Frequency: Minimum 5x a week            Contractures Contractures Info: Not present    Additional Factors Info  Code Status, Allergies Code Status Info: DNR Allergies Info: Fluoxetine Codeine Abilify,Trazodone And Nefazodone           Current Medications (01/22/2019):  This is the current hospital active medication list Current Facility-Administered Medications  Medication Dose Route Frequency Provider Last Rate Last Dose  . acetaminophen (TYLENOL) tablet 650 mg  650 mg Oral Q6H PRN Henreitta Leber, MD   650 mg at 01/22/19 0148   Or  . acetaminophen (TYLENOL) suppository 650 mg  650 mg Rectal Q6H PRN Henreitta Leber, MD      . budesonide (PULMICORT) nebulizer solution 0.5 mg  0.5 mg Nebulization BID Henreitta Leber, MD   0.5 mg at 01/22/19 0814  . enoxaparin (LOVENOX) injection 40 mg  40 mg Subcutaneous Q24H Henreitta Leber, MD   40 mg at 01/21/19 1537  . feeding supplement (ENSURE ENLIVE) (ENSURE ENLIVE) liquid 237 mL  237 mL Oral BID BM Sainani, Belia Heman, MD  237 mL at 01/22/19 0922  . fluticasone (FLONASE) 50 MCG/ACT nasal spray 1 spray  1 spray Each Nare Daily Lance Coon, MD   1 spray at [id number]  . folic acid (FOLVITE) tablet 1 mg  1 mg Oral Daily Lance Coon, MD   1 mg at 01/22/19 0920  . furosemide (LASIX) injection 40 mg  40 mg Intravenous Q12H Henreitta Leber, MD   40 mg at 01/22/19 1231  . gabapentin (NEURONTIN) capsule 300 mg  300 mg Oral TID Henreitta Leber, MD   300 mg at 01/22/19 0919  . ipratropium-albuterol  (DUONEB) 0.5-2.5 (3) MG/3ML nebulizer solution 3 mL  3 mL Nebulization Q6H Henreitta Leber, MD   3 mL at 01/22/19 0814  . lactulose (CHRONULAC) 10 GM/15ML solution 30 g  30 g Oral BID Henreitta Leber, MD   30 g at 01/22/19 [id number]  . LORazepam (ATIVAN) injection 0-4 mg  0-4 mg Intravenous Myrle Sheng, MD   2 mg at 01/21/19 2212   Followed by  . [START ON 01/23/2019] LORazepam (ATIVAN) injection 0-4 mg  0-4 mg Intravenous Q12H Lance Coon, MD      . LORazepam (ATIVAN) tablet 1 mg  1 mg Oral Q6H PRN Lance Coon, MD       Or  . LORazepam (ATIVAN) injection 1 mg  1 mg Intravenous Q6H PRN Lance Coon, MD   1 mg at 01/22/19 1232  . methylPREDNISolone sodium succinate (SOLU-MEDROL) 40 mg/mL injection 40 mg  40 mg Intravenous Q12H Henreitta Leber, MD   40 mg at 01/22/19 1233  . multivitamin with minerals tablet 1 tablet  1 tablet Oral Daily Lance Coon, MD   1 tablet at 01/22/19 1232  . nicotine (NICODERM CQ - dosed in mg/24 hours) patch 21 mg  21 mg Transdermal Daily Henreitta Leber, MD   21 mg at 01/22/19 [id number]  . ondansetron (ZOFRAN) tablet 4 mg  4 mg Oral Q6H PRN Henreitta Leber, MD       Or  . ondansetron (ZOFRAN) injection 4 mg  4 mg Intravenous Q6H PRN Henreitta Leber, MD      . oxyCODONE (Oxy IR/ROXICODONE) immediate release tablet 5 mg  5 mg Oral Q4H PRN Henreitta Leber, MD   5 mg at 01/22/19 0918  . sodium chloride (OCEAN) 0.65 % nasal spray 1 spray  1 spray Each Nare PRN Henreitta Leber, MD      . spironolactone (ALDACTONE) tablet 100 mg  100 mg Oral Daily Henreitta Leber, MD   100 mg at 01/22/19 0919  . thiamine (VITAMIN B-1) tablet 100 mg  100 mg Oral Daily Lance Coon, MD   100 mg at 01/22/19 0920   Or  . thiamine (B-1) injection 100 mg  100 mg Intravenous Daily Lance Coon, MD      . vitamin B-12 (CYANOCOBALAMIN) tablet 100 mcg  100 mcg Oral Daily Henreitta Leber, MD   100 mcg at 01/22/19 0920     Discharge Medications: Please see discharge summary for a list of  discharge medications.  Relevant Imaging Results:  Relevant Lab Results:   Additional Information SSN [id number]  Ross Ludwig, LCSW

## 2019-01-22 NOTE — TOC Initial Note (Signed)
Transition of Care [facility]) - Initial/Assessment Note    Patient Details  Name: Andrew Carey MRN: [id number] Date of Birth: 04-May-1951  Transition of Care [facility]) CM/SW Contact:    Ross Ludwig, LCSW Phone Number: 01/22/2019, 4:54 PM  Clinical Narrative:                  Patient is a 68 year old male who is alert and oriented x4.  Patient lives alone at an extended stay hotel.  Patient was explained role of CSW and process for finding SNF placement.  Patient states he was in rehab a couple years ago while he was living in [place].  Patient was explained how insurance will pay for his stay at SNF.  CSW reminded what to expect while at SNF for rehab, CSW provided bed offers and [facility] offered a bed pending insurance authorization.  CSW spoke to [facility] care and they can accept patient pending insurance approval.  Patient can discharge tomorrow if he is medically ready for discharge without another Covid screen per [facility].  Patient did not have any other questions or concerns, CSW to continue to follow patient's progress throughout discharge planning.                                                                                                                                                                                                                                                                                   Expected Discharge Plan: Skilled Nursing Facility Barriers to Discharge: Continued Medical Work up   Patient Goals and CMS Choice Patient states their goals for this hospitalization and ongoing recovery are:: To go to SNF for short term rehab, then return either back home or at his sister's house. CMS Medicare.gov Compare Post Acute Care list provided to:: Patient Choice offered to / list presented to : Patient  Expected Discharge Plan and Services Expected Discharge Plan: [place] In-house Referral: Clinical Social  Work   Post Acute Care Choice: [place] Living arrangements for the past 2 months: Hotel/Motel  DME Arranged: N/A DME Agency: NA     Representative spoke with at DME Agency: na   [place]: NA     Representative spoke with at [place]: na  Prior Living Arrangements/Services Living arrangements for the past 2 months: Hotel/Motel Lives with:: Self Patient language and need for interpreter reviewed:: Yes Do you feel safe going back to the place where you live?: No   Patient feels he needs some short term rehab, before he is able to return back home or to his sister's house.  Need for Family Participation in Patient Care: No (Comment) Care giver support system in place?: No (comment) Current home services: (na) Criminal Activity/Legal Involvement Pertinent to Current Situation/Hospitalization: No - Comment as needed  Activities of Daily Living Home Assistive Devices/Equipment: Shower chair with back, Environmental consultant (specify type), Crutches, Eyeglasses ADL Screening (condition at time of admission) Patient's cognitive ability adequate to safely complete daily activities?: Yes Is the patient deaf or have difficulty hearing?: No Does the patient have difficulty seeing, even when wearing glasses/contacts?: No Does the patient have difficulty concentrating, remembering, or making decisions?: Yes Patient able to express need for assistance with ADLs?: Yes Does the patient have difficulty dressing or bathing?: Yes Independently performs ADLs?: Yes (appropriate for developmental age) Does the patient have difficulty walking or climbing stairs?: Yes Weakness of Legs: None Weakness of Arms/Hands: None  Permission Sought/Granted Permission sought to share information with : Facility Sport and exercise psychologist, Family Supports Permission granted to share information with : Yes, Verbal Permission Granted  Share Information with NAME: Andrew Carey [phone]   or Andrew Carey   [phone]  Permission granted to share info w AGENCY: SNF admissions        Emotional Assessment Appearance:: Appears older than stated age Attitude/Demeanor/Rapport: Engaged Affect (typically observed): Accepting, Adaptable, Appropriate, Stable, Pleasant Orientation: : Oriented to Self, Oriented to Place, Oriented to Situation, Oriented to  Time Alcohol / Substance Use: Alcohol Use Psych Involvement: No (comment)  Admission diagnosis:  Dizziness [R42] Hypoxia [id number] Acute systolic congestive heart failure (HCC) [I50.21] AKI (acute kidney injury) ([place]) [N17.9] Patient Active Problem List   Diagnosis Date Noted  . CHF (congestive heart failure) ([place]) 01/20/2019  . Uncomplicated alcohol dependence ([place])   . Tobacco use disorder   . Severe recurrent major depression without psychotic features ([place]) 06/02/2017  . Benzodiazepine overdose 06/02/2017  . Moderate recurrent major depression ([place]) 02/17/2017  . Adjustment disorder with mixed anxiety and depressed mood 02/17/2017  . Alcohol abuse 02/17/2017  . Elevated coronary artery calcium score 12/22/2016  . GAD (generalized anxiety disorder) 06/14/2016  . Chronic joint pain 02/16/2016  . Pruritic condition 02/10/2016  . Panic disorder 02/10/2016  . Alcoholic cirrhosis of liver with ascites ([place]) 02/10/2016  . Hypotension due to drugs 01/19/2016  . Abdominal pain 01/14/2016  . Encephalopathy, hepatic ([place]) 01/14/2016   PCP:  Birdie Sons, MD Pharmacy:   [id number] [address], [place] [address] Phone: [phone] Fax: [phone]     Social Determinants of Health (SDOH) Interventions    Readmission Risk Interventions No flowsheet data found.

## 2019-01-23 ENCOUNTER — Inpatient Hospital Stay: Payer: Medicare Other

## 2019-01-23 LAB — COMPREHENSIVE METABOLIC PANEL
ALT: 21 U/L (ref 0–44)
AST: 19 U/L (ref 15–41)
Albumin: 3.3 g/dL — ABNORMAL LOW (ref 3.5–5.0)
Alkaline Phosphatase: 86 U/L (ref 38–126)
Anion gap: 12 (ref 5–15)
BUN: 16 mg/dL (ref 8–23)
CO2: 34 mmol/L — ABNORMAL HIGH (ref 22–32)
Calcium: 9 mg/dL (ref 8.9–10.3)
Chloride: 93 mmol/L — ABNORMAL LOW (ref 98–111)
Creatinine, Ser: 0.67 mg/dL (ref 0.61–1.24)
GFR calc Af Amer: >60 mL/min (ref >60)
GFR calc non Af Amer: >60 mL/min (ref >60)
Glucose, Bld: 223 mg/dL — ABNORMAL HIGH (ref 70–99)
Potassium: 3.6 mmol/L (ref 3.5–5.1)
Sodium: 139 mmol/L (ref 135–145)
Total Bilirubin: 1 mg/dL (ref 0.3–1.2)
Total Protein: 7.4 g/dL (ref 6.5–8.1)

## 2019-01-23 MED ORDER — METHYLPREDNISOLONE SODIUM SUCC 40 MG IJ SOLR
40.0000 mg | Freq: Three times a day (TID) | INTRAMUSCULAR | Status: DC
  Administered 2019-01-23 – 2019-01-24 (×2): 40 mg via INTRAVENOUS
  Filled 2019-01-23 (×2): qty 1

## 2019-01-23 NOTE — Progress Notes (Signed)
[facility] [place] NAME: Andrew Carey    MR#:  [id number]  DATE OF BIRTH:  May 20, 1951  SUBJECTIVE:   Patient is more awake and alert today is complaining of some worsening shortness of breath.  Remains on 5 L nasal cannula.  No other acute events overnight.  Denies any chest pain, nausea vomiting abdominal pain or any other associated symptoms.  REVIEW OF SYSTEMS:    Review of Systems  Constitutional: Negative for chills and fever.  HENT: Negative for congestion and tinnitus.   Eyes: Negative for blurred vision and double vision.  Respiratory: Positive for shortness of breath. Negative for cough and wheezing.   Cardiovascular: Positive for leg swelling. Negative for chest pain, orthopnea and PND.  Gastrointestinal: Negative for abdominal pain, diarrhea, nausea and vomiting.  Genitourinary: Negative for dysuria and hematuria.  Neurological: Positive for weakness (generalized). Negative for dizziness, sensory change and focal weakness.  All other systems reviewed and are negative.   Nutrition: 2 gram sodium.  Tolerating Diet: Yes Tolerating PT: Eval noted.   DRUG ALLERGIES:   Allergies  Allergen Reactions  . Fluoxetine     Hallucinations  . Codeine Nausea And Vomiting and Nausea Only  . Abilify [Aripiprazole]     Anxiety and panic attacks  . Trazodone And Nefazodone     Vivid dreams    VITALS:  Blood pressure 110/78, pulse 95, temperature 98.3 F (36.8 C), temperature source Oral, resp. rate 20, height 6' (1.829 m), weight 121.6 kg, SpO2 90 %.  PHYSICAL EXAMINATION:   Physical Exam  GENERAL:  68 y.o.-year-old patient lying in bed in NAD  EYES: Pupils equal, round, reactive to light and accommodation. No scleral icterus. Extraocular muscles intact.  HEENT: Head atraumatic, normocephalic. Oropharynx and nasopharynx clear.  NECK:  Supple, no jugular venous distention. No thyroid enlargement, no tenderness.  LUNGS: Good  a/e b/l,   Minimal end-exp. Wheezing b/l, bibasilar rales, No rhonchi. No use of accessory muscles of respiration.  CARDIOVASCULAR: S1, S2 normal. No murmurs, rubs, or gallops.  ABDOMEN: Soft, nontender, nondistended. Bowel sounds present. No organomegaly or mass.  EXTREMITIES: No cyanosis, clubbing, + 1-2 edema b/l.  NEUROLOGIC: Cranial nerves II through XII are intact. No focal Motor or sensory deficits b/l. Globally weak.    PSYCHIATRIC: The patient is alert and oriented x 3.  SKIN: No obvious rash, lesion, or ulcer.    LABORATORY PANEL:   CBC Recent Labs  Lab 01/21/19 0607  WBC 9.1  HGB 15.1  HCT 46.3  PLT 159   ------------------------------------------------------------------------------------------------------------------  Chemistries  Recent Labs  Lab 01/23/19 0442  NA 139  K 3.6  CL 93*  CO2 34*  GLUCOSE 223*  BUN 16  CREATININE 0.67  CALCIUM 9.0  AST 19  ALT 21  ALKPHOS 86  BILITOT 1.0   ------------------------------------------------------------------------------------------------------------------  Cardiac Enzymes No results for input(s): TROPONINI in the last 168 hours. ------------------------------------------------------------------------------------------------------------------  RADIOLOGY:  Dg Chest Port 1 View  Result Date: 01/23/2019 CLINICAL DATA:  Hypoxia. EXAM: PORTABLE CHEST 1 VIEW COMPARISON:  Radiograph of January 20, 2019. FINDINGS: Stable cardiomegaly. No pneumothorax is noted. Central pulmonary vascular congestion is noted. Small right pleural effusion may be present. Mild right basilar atelectasis may be present. Bony thorax is unremarkable. IMPRESSION: Stable cardiomegaly with central pulmonary vascular congestion. Mild right basilar subsegmental atelectasis may be present with small right pleural effusion. Electronically Signed   By: Marijo Conception M.D.   On:  01/23/2019 13:49     ASSESSMENT AND PLAN:   68 year old male with past  medical history of alcohol abuse, liver cirrhosis, anxiety, COPD with ongoing tobacco abuse who presents to the hospital due to weakness, dizziness and noted to be in acute respiratory failure with hypoxia.  1.  Acute respiratory failure with hypoxia- secondary to a combination of CHF and also underlying COPD. -Continue O2 supplementation and will try to wean O2 today.  Try to tolerate O2 sats 90% or higher. -Continue treatment for underlying COPD and CHF as mentioned below.  2.  CHF- Acute systolic CHF.  Echo showing EF of 30-35%.     - due to alcoholic related dilated cardiomyopathy. -Continue diuresis with IV Lasix, patient's about 4 L negative since admission.  Responding well. - cont. Aldactone.  - follow I's and O's and daily weights.  3.  Elevated troponin-secondary to supply demand ischemia from hypoxemia and also underlying CHF. - Echocardiogram showing EF of 35 to 40%.  No acute wall motion abnormality and suspect this is all related to demand ischemia from underlying dilated cardiomyopathy and CHF.   4.  COPD-mild acute exacerbation secondary to ongoing tobacco abuse.  Patient's chest x-ray does not show any evidence of pneumonia just mild CHF.  Repeat chest x-ray still showing some mild pulmonary vascular congestion. Continue treatment for CHF as mentioned above, continue IV steroids which I will advance further due to worsening hypoxemia and shortness of breath, continue duo nebs, Pulmicort nebs and pulmonary toileting.  ABG yesterday showed hypercapnia but patient's pH is normal. -Patient will benefit from outpatient sleep study.  5.  Liver cirrhosis-secondary to alcohol abuse. -No evidence of hepatic encephalopathy presently and cont. Lactulose.   - US showing no ascites yesterday and therefore no need for Paracentesis.  - Continue diuresis with Lasix, cont. Aldactone.  6.  Neuropathy-continue gabapentin.  7.  Anxiety-continue Xanax.  8.  Chronic pain-continue  oxycodone.  9. Tobacco abuse - cont. Nicotine patch.   PT evaluation noted and patient will likely need short-term rehab upon discharge.   If able to be weaned off 2 to 3 L nasal cannula overnight will discharge to skilled nursing facility tomorrow.   All the records are reviewed and case discussed with Care Management/Social Worker. Management plans discussed with the patient, family and they are in agreement.  CODE STATUS: DNR  DVT Prophylaxis: Lovenox  TOTAL TIME TAKING CARE OF THIS PATIENT: 30 minutes.   POSSIBLE D/C IN 1-2 DAYS, DEPENDING ON CLINICAL CONDITION.   Henreitta Leber M.D on 01/23/2019 at 4:45 PM  Between 7am to 6pm - Pager - [phone]  After 6pm go to [email or web address] - Proofreader  [facility]  Office  [phone]  CC: Primary care physician; Birdie Sons, MD

## 2019-01-23 NOTE — TOC Progression Note (Signed)
Transition of Care [facility]) - Progression Note    Patient Details  Name: Andrew Carey MRN: [id number] Date of Birth: 23-Jun-1950  Transition of Care [facility]) CM/SW Contact  Ross Ludwig,  Phone Number: 01/23/2019, 5:30 PM  Clinical Narrative:     CSW received phone call from [facility], they have received insurance auth for patient to go to SNF pending new covid negative test and once patient is medically ready for discharge.                                             Expected Discharge Plan: [place] Barriers to Discharge: Continued Medical Work up  Expected Discharge Plan and Services Expected Discharge Plan: [place] In-house Referral: Clinical Social Work   Post Acute Care Choice: [place] Living arrangements for the past 2 months: Hotel/Motel                 DME Arranged: N/A DME Agency: NA     Representative spoke with at DME Agency: na   [place]: NA     Representative spoke with at [place]: na   Social Determinants of Health ([place]) Interventions    Readmission Risk Interventions No flowsheet data found.

## 2019-01-23 NOTE — Plan of Care (Signed)
  Problem: Activity: Goal: Capacity to carry out activities will improve Outcome: Not Progressing   

## 2019-01-23 NOTE — Progress Notes (Signed)
Physical Therapy Treatment Patient Details Name: Andrew Carey MRN: [id number] DOB: 1951/02/06 Today's Date: 01/23/2019    History of Present Illness From MD H&P: Pt is a 68 y.o. male with a known history of alcohol abuse, liver cirrhosis, depression, COPD with ongoing tobacco abuse who presented to the hospital due to dizziness and weakness.  He has noticed increase in his abdominal girth and also increasing lower extremity edema and is not able to carry his activities of daily living and had a fall today and therefore came to the ER for further evaluation.  Patient was noted to be hypoxic with room air O2 sats in the low 80s and thought to have acute respiratory failure with hypoxia secondary to CHF and also underlying COPD and therefore hospitalist services were contacted for admission.  MD assessment includes: Acute respiratory failure with hypoxia, CHF, elevated troponin secondary to demand ischemia, COPD exacerbation, liver cirrhosis, and neuropathy.    PT Comments    Pt presents with deficits in strength, transfers, mobility, gait, balance, and activity tolerance.  Session limited secondary to pt needing to have a BM.  Pt required the [facility] to be elevated along with extra time and effort during practicing multiple sup to/from sits.  Pt with min-mod SOB with bed mobility effort with SpO2 in the mid 80s.  SpO2 improved back to high >/= 90% with cues for PLB.  Pt was able to stand from an elevated EOB with CGA with extra effort but was steady without LOB upon initial stand.  Pt was able to amb 8' with CGA and heavy lean on the RW taking slow, effortful steps.  Pt will benefit from PT services in a SNF setting upon discharge to safely address above deficits for decreased caregiver assistance and eventual return to PLOF.     Follow Up Recommendations  SNF;Supervision for mobility/OOB     Equipment Recommendations  Other (comment);Rolling walker with 5" wheels(TBD at next venue of care upon  discharge to SNF)    Recommendations for Other Services       Precautions / Restrictions Precautions Precautions: Fall Restrictions Weight Bearing Restrictions: No    Mobility  Bed Mobility Overal bed mobility: Needs Assistance Bed Mobility: Supine to Sit;Sit to Supine     Supine to sit: Supervision;HOB elevated Sit to supine: Supervision;HOB elevated   General bed mobility comments: Pt required HOB elevated with extra time and effort during sup to/from sit  Transfers Overall transfer level: Needs assistance Equipment used: Rolling walker (2 wheeled) Transfers: Sit to/from Stand Sit to Stand: From elevated surface;Min guard         General transfer comment: CGA during sup to/from sit with pt requiring the EOB to be elevated in order to stand  Ambulation/Gait Ambulation/Gait assistance: Min guard Gait Distance (Feet): 8 Feet Assistive device: Rolling walker (2 wheeled) Gait Pattern/deviations: Step-to pattern;Trunk flexed;Decreased step length - left;Decreased step length - right Gait velocity: decreased   General Gait Details: Slow, effortful steps with heavy lean on the RW for support   Stairs             Wheelchair Mobility    Modified Rankin (Stroke Patients Only)       Balance Overall balance assessment: Needs assistance Sitting-balance support: Bilateral upper extremity supported;Feet supported Sitting balance-Leahy Scale: Good     Standing balance support: Bilateral upper extremity supported Standing balance-Leahy Scale: Fair Standing balance comment: Heavy lean on the RW for support  Cognition Arousal/Alertness: Awake/alert Behavior During Therapy: WFL for tasks assessed/performed Overall Cognitive Status: Within Functional Limits for tasks assessed                                        Exercises Total Joint Exercises Ankle Circles/Pumps: Strengthening;Both;10 reps Quad Sets:  Strengthening;Both;10 reps Gluteal Sets: Strengthening;Both;10 reps Hip ABduction/ADduction: Both;5 reps;AROM Long Arc Quad: AROM;10 reps;Both Knee Flexion: AROM;Both;10 reps    General Comments        Pertinent Vitals/Pain Pain Assessment: No/denies pain    Home Living                      Prior Function            PT Goals (current goals can now be found in the care plan section) Progress towards PT goals: Progressing toward goals    Frequency    Min 2X/week      PT Plan Current plan remains appropriate    Co-evaluation              AM-PAC PT "6 Clicks" Mobility   Outcome Measure  Help needed turning from your back to your side while in a flat bed without using bedrails?: A Little Help needed moving from lying on your back to sitting on the side of a flat bed without using bedrails?: A Little Help needed moving to and from a bed to a chair (including a wheelchair)?: A Little Help needed standing up from a chair using your arms (e.g., wheelchair or bedside chair)?: A Little Help needed to walk in hospital room?: A Little Help needed climbing 3-5 steps with a railing? : A Lot 6 Click Score: 17    End of Session Equipment Utilized During Treatment: Gait belt;Oxygen Activity Tolerance: Patient tolerated treatment well Patient left: Other (comment);with call bell/phone within reach(Pt in BR attempting BM, nursing notified; pt verbalized that would use call bell when ready for nsg assistance) Nurse Communication: Mobility status PT Visit Diagnosis: History of falling (Z91.81);Difficulty in walking, not elsewhere classified (R26.2);Muscle weakness (generalized) (M62.81)     Time: 1335-1350 PT Time Calculation (min) (ACUTE ONLY): 15 min  Charges:  $Therapeutic Exercise: 8-22 mins                     D. Scott Zira Helinski PT, DPT 01/23/19, 2:02 PM

## 2019-01-23 NOTE — Care Management Important Message (Signed)
Important Message  Patient Details  Name: Saber Petricca MRN: [id number] Date of Birth: 1950-06-08   Medicare Important Message Given:  Yes     Dannette Barbara 01/23/2019, 11:15 AM

## 2019-01-23 NOTE — Progress Notes (Signed)
Patient ambulated to bathroom and back to bed O2 saturations fall to 80% on 4 L O2 via [place]. Patient taking approximately 3-4 minutes to recover O2 saturations on 6 L Nasal Cannula with coached breathing. Able to maintain saturations at 90% on 4 L O2 [address] after recovery.

## 2019-01-24 ENCOUNTER — Other Ambulatory Visit: Payer: Self-pay

## 2019-01-24 DIAGNOSIS — G8929 Other chronic pain: Secondary | ICD-10-CM

## 2019-01-24 DIAGNOSIS — M2550 Pain in unspecified joint: Secondary | ICD-10-CM

## 2019-01-24 LAB — BASIC METABOLIC PANEL
Anion gap: 13 (ref 5–15)
BUN: 21 mg/dL (ref 8–23)
CO2: 36 mmol/L — ABNORMAL HIGH (ref 22–32)
Calcium: 9.1 mg/dL (ref 8.9–10.3)
Chloride: 92 mmol/L — ABNORMAL LOW (ref 98–111)
Creatinine, Ser: 0.69 mg/dL (ref 0.61–1.24)
GFR calc Af Amer: >60 mL/min (ref >60)
GFR calc non Af Amer: >60 mL/min (ref >60)
Glucose, Bld: 120 mg/dL — ABNORMAL HIGH (ref 70–99)
Potassium: 3.3 mmol/L — ABNORMAL LOW (ref 3.5–5.1)
Sodium: 141 mmol/L (ref 135–145)

## 2019-01-24 LAB — SARS CORONAVIRUS 2 BY RT PCR (HOSPITAL ORDER, PERFORMED IN ~~LOC~~ HOSPITAL LAB): SARS Coronavirus 2: NEGATIVE

## 2019-01-24 MED ORDER — OXYCODONE HCL 15 MG PO TABS: ORAL_TABLET | ORAL | 0 refills | Status: DC

## 2019-01-24 MED ORDER — METHYLPREDNISOLONE SODIUM SUCC 125 MG IJ SOLR
60.0000 mg | Freq: Four times a day (QID) | INTRAMUSCULAR | Status: DC
  Administered 2019-01-24 – 2019-01-25 (×4): 60 mg via INTRAVENOUS
  Filled 2019-01-24 (×4): qty 2

## 2019-01-24 MED ORDER — POTASSIUM CHLORIDE CRYS ER 20 MEQ PO TBCR
40.0000 meq | EXTENDED_RELEASE_TABLET | Freq: Once | ORAL | Status: AC
  Administered 2019-01-24: 40 meq via ORAL
  Filled 2019-01-24: qty 2

## 2019-01-24 MED ORDER — FUROSEMIDE 10 MG/ML IJ SOLN
40.0000 mg | Freq: Every day | INTRAMUSCULAR | Status: DC
  Administered 2019-01-24 – 2019-01-25 (×2): 40 mg via INTRAVENOUS
  Filled 2019-01-24 (×2): qty 4

## 2019-01-24 MED ORDER — METOPROLOL SUCCINATE ER 25 MG PO TB24
12.5000 mg | ORAL_TABLET | Freq: Every day | ORAL | Status: DC
  Administered 2019-01-24 – 2019-01-25 (×2): 12.5 mg via ORAL
  Filled 2019-01-24 (×2): qty 1

## 2019-01-24 NOTE — Progress Notes (Signed)
Spoke with patient regarding the outpatient HF Clinic and an appointment was scheduled. Should he remain hospitalized over the weekend, this appointment can be R/S. Thank you.

## 2019-01-24 NOTE — Plan of Care (Signed)

## 2019-01-24 NOTE — Progress Notes (Signed)
Physical Therapy Treatment Patient Details Name: Andrew Carey MRN: [id number] DOB: 1951/01/31 Today's Date: 01/24/2019    History of Present Illness 68 y.o. male with a known history of alcohol abuse, liver cirrhosis, depression, COPD with ongoing tobacco abuse who presented to the hospital due to dizziness and weakness.  He has noticed increase in his abdominal girth and also increasing lower extremity edema and is not able to carry his activities of daily living and had a fall today and therefore came to the ER for further evaluation.  Patient was noted to be hypoxic with room air O2 sats in the low 80s and thought to have acute respiratory failure with hypoxia secondary to CHF and also underlying COPD and therefore hospitalist services were contacted for admission.  MD assessment includes: Acute respiratory failure with hypoxia, CHF, elevated troponin secondary to demand ischemia, COPD exacerbation, liver cirrhosis, and neuropathy.    PT Comments    Pt pleasant and willing to do some light activity (ultimately did much more mobility than he has since hospitalization) and agreed to do some in-room ambulation.  Pt on 4L O2 t/o the session with sats generally 88-92%.  He was reliant on the walker but was able to ambulate with frequent but small scale stagger steps that he was able to self arrest.  Pt was able to ambulate ~45 ft and then another 20 ft after seated rest break.     Follow Up Recommendations  SNF;Supervision for mobility/OOB     Equipment Recommendations  Rolling walker with 5" wheels    Recommendations for Other Services       Precautions / Restrictions Precautions Precautions: Fall Restrictions Weight Bearing Restrictions: No    Mobility  Bed Mobility Overal bed mobility: Modified Independent Bed Mobility: Supine to Sit     Supine to sit: Supervision     General bed mobility comments: Pt with some minimal increased effort to attain sitting EOB, but  ultimatley did not need direct assist  Transfers Overall transfer level: Modified independent Equipment used: Rolling walker (2 wheeled) Transfers: Sit to/from Stand Sit to Stand: Min guard         General transfer comment: Cues to use UEs making transition to standing easier, pt with some initial unsteadiness but no LOBs  Ambulation/Gait Ambulation/Gait assistance: Min guard Gait Distance (Feet): 45 Feet Assistive device: Rolling walker (2 wheeled)       General Gait Details: Pt continues to rely heavily on the walker, but with faster and more consistent cadence today.  After multiple minute seated rest break he did an additional 20 ft.  Pt with multiple small scale stagger steps/partial knee buckling that he is able to self arrest.  Pt with some fatigue (on 4L t/o the effort) with sats generally staying 88-92% t/o the session   Stairs             Wheelchair Mobility    Modified Rankin (Stroke Patients Only)       Balance Overall balance assessment: Needs assistance Sitting-balance support: Bilateral upper extremity supported;Feet supported Sitting balance-Leahy Scale: Good Sitting balance - Comments: Pt able to maintain standing tolerance w/o issue   Standing balance support: Bilateral upper extremity supported Standing balance-Leahy Scale: Fair Standing balance comment: Reliant on walker in standing, some stagger stepping during ambulation                            Cognition Arousal/Alertness: Awake/alert Behavior During Therapy: [facility]  for tasks assessed/performed Overall Cognitive Status: Within Functional Limits for tasks assessed                                        Exercises      General Comments        Pertinent Vitals/Pain Pain Assessment: (chronic joint and back pain, no acute pain pre/post amb)    Home Living Family/patient expects to be discharged to:: Skilled nursing facility                    Prior  Function            PT Goals (current goals can now be found in the care plan section) Progress towards PT goals: Progressing toward goals    Frequency    Min 2X/week      PT Plan Current plan remains appropriate    Co-evaluation              AM-PAC PT "6 Clicks" Mobility   Outcome Measure  Help needed turning from your back to your side while in a flat bed without using bedrails?: None Help needed moving from lying on your back to sitting on the side of a flat bed without using bedrails?: None Help needed moving to and from a bed to a chair (including a wheelchair)?: A Little Help needed standing up from a chair using your arms (e.g., wheelchair or bedside chair)?: A Little Help needed to walk in hospital room?: A Little Help needed climbing 3-5 steps with a railing? : A Lot 6 Click Score: 19    End of Session Equipment Utilized During Treatment: Gait belt;Oxygen Activity Tolerance: Patient tolerated treatment well Patient left: Other (comment);with call bell/phone within reach Nurse Communication: Mobility status PT Visit Diagnosis: History of falling (Z91.81);Difficulty in walking, not elsewhere classified (R26.2);Muscle weakness (generalized) (M62.81)     Time: [id number] PT Time Calculation (min) (ACUTE ONLY): 14 min  Charges:  $Gait Training: 8-22 mins                     Kreg Shropshire, DPT 01/24/2019, 5:17 PM

## 2019-01-24 NOTE — TOC Progression Note (Signed)
Transition of Care [facility]) - Progression Note    Patient Details  Name: Andrew Carey MRN: [id number] Date of Birth: [birth year]  Transition of Care [facility]) CM/SW Contact  Shade Flood, LCSW Phone Number: 01/24/2019, 11:00 AM  Clinical Narrative:     TOC following. Per MD, pt not stable for dc today. MD hopeful for dc tomorrow. New COVID test ordered for SNF placement. Retia Passe at [facility]. They will accept pt tomorrow if COVID negative and pt stable.  Assigned TOC will follow.  Expected Discharge Plan: [place] Barriers to Discharge: Continued Medical Work up  Expected Discharge Plan and Services Expected Discharge Plan: [place] In-house Referral: Clinical Social Work   Post Acute Care Choice: [place] Living arrangements for the past 2 months: Hotel/Motel                 DME Arranged: N/A DME Agency: NA     Representative spoke with at DME Agency: na   [place]: NA     Representative spoke with at [place]: na   Social Determinants of Health ([place]) Interventions    Readmission Risk Interventions Readmission Risk Prevention Plan 01/24/2019  Medication Screening Complete  Transportation Screening Complete  Some recent data might be hidden

## 2019-01-24 NOTE — Progress Notes (Signed)
[facility] [place] NAME: Andrew Carey    MR#:  [id number]  DATE OF BIRTH:  [birth year]  SUBJECTIVE:   Shortness of breath much improved since admission.  Still requires 4 to 5 L of nasal cannula oxygen and gets hypoxic on minimal exertion.  Still having some wheezing and bronchospasm today.  REVIEW OF SYSTEMS:    Review of Systems  Constitutional: Negative for chills and fever.  HENT: Negative for congestion and tinnitus.   Eyes: Negative for blurred vision and double vision.  Respiratory: Positive for shortness of breath and wheezing. Negative for cough.   Cardiovascular: Positive for leg swelling. Negative for chest pain, orthopnea and PND.  Gastrointestinal: Negative for abdominal pain, diarrhea, nausea and vomiting.  Genitourinary: Negative for dysuria and hematuria.  Neurological: Positive for weakness (generalized). Negative for dizziness, sensory change and focal weakness.  All other systems reviewed and are negative.   Nutrition: 2 gram sodium.  Tolerating Diet: Yes Tolerating PT: Eval noted.   DRUG ALLERGIES:   Allergies  Allergen Reactions  . Fluoxetine     Hallucinations  . Codeine Nausea And Vomiting and Nausea Only  . Abilify [Aripiprazole]     Anxiety and panic attacks  . Trazodone And Nefazodone     Vivid dreams    VITALS:  Blood pressure 111/77, pulse 92, temperature 97.6 F (36.4 C), temperature source Oral, resp. rate 20, height 6' (1.829 m), weight 119 kg, SpO2 91 %.  PHYSICAL EXAMINATION:   Physical Exam  GENERAL:  68 y.o.-year-old patient lying in bed in NAD  EYES: Pupils equal, round, reactive to light and accommodation. No scleral icterus. Extraocular muscles intact.  HEENT: Head atraumatic, normocephalic. Oropharynx and nasopharynx clear.  NECK:  Supple, no jugular venous distention. No thyroid enlargement, no tenderness.  LUNGS: Good air entry bilaterally, diffuse wheezing bilaterally, no  rales, rhonchi.  Negative use of accessory muscles.  CARDIOVASCULAR: S1, S2 normal. No murmurs, rubs, or gallops.  ABDOMEN: Soft, nontender, nondistended. Bowel sounds present. No organomegaly or mass.  EXTREMITIES: No cyanosis, clubbing, + 1-2 edema b/l.  NEUROLOGIC: Cranial nerves II through XII are intact. No focal Motor or sensory deficits b/l. Globally weak.    PSYCHIATRIC: The patient is alert and oriented x 3.  SKIN: No obvious rash, lesion, or ulcer.    LABORATORY PANEL:   CBC Recent Labs  Lab 01/21/19 0607  WBC 9.1  HGB 15.1  HCT 46.3  PLT 159   ------------------------------------------------------------------------------------------------------------------  Chemistries  Recent Labs  Lab 01/23/19 0442 01/24/19 0416  NA 139 141  K 3.6 3.3*  CL 93* 92*  CO2 34* 36*  GLUCOSE 223* 120*  BUN 16 21  CREATININE 0.67 0.69  CALCIUM 9.0 9.1  AST 19  --   ALT 21  --   ALKPHOS 86  --   BILITOT 1.0  --    ------------------------------------------------------------------------------------------------------------------  Cardiac Enzymes No results for input(s): TROPONINI in the last 168 hours. ------------------------------------------------------------------------------------------------------------------  RADIOLOGY:  Dg Chest Port 1 View  Result Date: 01/23/2019 CLINICAL DATA:  Hypoxia. EXAM: PORTABLE CHEST 1 VIEW COMPARISON:  Radiograph of January 20, 2019. FINDINGS: Stable cardiomegaly. No pneumothorax is noted. Central pulmonary vascular congestion is noted. Small right pleural effusion may be present. Mild right basilar atelectasis may be present. Bony thorax is unremarkable. IMPRESSION: Stable cardiomegaly with central pulmonary vascular congestion. Mild right basilar subsegmental atelectasis may be present with small right pleural effusion. Electronically Signed   By:  Marijo Conception M.D.   On: 01/23/2019 13:49     ASSESSMENT AND PLAN:   68 year old male with  past medical history of alcohol abuse, liver cirrhosis, anxiety, COPD with ongoing tobacco abuse who presents to the hospital due to weakness, dizziness and noted to be in acute respiratory failure with hypoxia.  1.  Acute respiratory failure with hypoxia- secondary to a combination of CHF and also underlying COPD. -Still having some significant wheezing and bronchospasm today. -Continue O2 supplementation and attempt to wean as tolerated.  Continue treatment for underlying COPD and CHF as mentioned below.  2.  CHF- Acute systolic CHF.  Echo showing EF of 30-35%.     - due to alcoholic related dilated cardiomyopathy. -Continue diuresis with IV Lasix and about 5 L negative since admission.  - cont. Toprol, Aldactone.  - follow I's and O's and daily weights.  3.  Elevated troponin-secondary to supply demand ischemia from hypoxemia and also underlying CHF. - Echocardiogram showing EF of 35 to 40%.  No acute wall motion abnormality and suspect this is all related to demand ischemia from underlying dilated cardiomyopathy and CHF.   4.  COPD-mild acute exacerbation secondary to ongoing tobacco abuse.   Repeat chest x-ray yesterday showing some mild pulmonary vascular congestion.  Patient having some wheezing and bronchospasm today. -We will increase IV steroids, scheduled duo nebs Pulmicort nebs. -Patient will benefit from outpatient sleep study.  5.  Liver cirrhosis-secondary to alcohol abuse. -No evidence of hepatic encephalopathy presently and cont. Lactulose.   - US showing no ascites yesterday and therefore no need for Paracentesis.  - Continue diuresis with Lasix, cont. Aldactone.  6.  Neuropathy-continue gabapentin.  7.  Anxiety-continue Xanax.  8.  Chronic pain-continue oxycodone.  9. Tobacco abuse - cont. Nicotine patch.   Discharge to short-term rehab tomorrow.  Repeat COVID-19 test has been ordered.   All the records are reviewed and case discussed with Care  Management/Social Worker. Management plans discussed with the patient, family and they are in agreement.  CODE STATUS: DNR  DVT Prophylaxis: Lovenox  TOTAL TIME TAKING CARE OF THIS PATIENT: 30 minutes.   POSSIBLE D/C IN 1-2 DAYS, DEPENDING ON CLINICAL CONDITION.   Henreitta Leber M.D on 01/24/2019 at 3:19 PM  Between 7am to 6pm - Pager - [phone]  After 6pm go to [email or web address] - Proofreader  [facility]  Office  [phone]  CC: Primary care physician; Birdie Sons, MD

## 2019-01-24 NOTE — Telephone Encounter (Signed)
Patient called requesting refill

## 2019-01-25 DIAGNOSIS — J9610 Chronic respiratory failure, unspecified whether with hypoxia or hypercapnia: Secondary | ICD-10-CM | POA: Diagnosis not present

## 2019-01-25 DIAGNOSIS — M6281 Muscle weakness (generalized): Secondary | ICD-10-CM | POA: Diagnosis not present

## 2019-01-25 DIAGNOSIS — I5021 Acute systolic (congestive) heart failure: Secondary | ICD-10-CM | POA: Diagnosis not present

## 2019-01-25 DIAGNOSIS — K7460 Unspecified cirrhosis of liver: Secondary | ICD-10-CM | POA: Diagnosis not present

## 2019-01-25 DIAGNOSIS — J9621 Acute and chronic respiratory failure with hypoxia: Secondary | ICD-10-CM | POA: Diagnosis not present

## 2019-01-25 DIAGNOSIS — R140 Abdominal distension (gaseous): Secondary | ICD-10-CM | POA: Diagnosis not present

## 2019-01-25 DIAGNOSIS — R0902 Hypoxemia: Secondary | ICD-10-CM | POA: Diagnosis not present

## 2019-01-25 DIAGNOSIS — R279 Unspecified lack of coordination: Secondary | ICD-10-CM | POA: Diagnosis not present

## 2019-01-25 DIAGNOSIS — I5020 Unspecified systolic (congestive) heart failure: Secondary | ICD-10-CM | POA: Diagnosis not present

## 2019-01-25 DIAGNOSIS — Z743 Need for continuous supervision: Secondary | ICD-10-CM | POA: Diagnosis not present

## 2019-01-25 DIAGNOSIS — R5381 Other malaise: Secondary | ICD-10-CM | POA: Diagnosis not present

## 2019-01-25 DIAGNOSIS — R262 Difficulty in walking, not elsewhere classified: Secondary | ICD-10-CM | POA: Diagnosis not present

## 2019-01-25 DIAGNOSIS — I509 Heart failure, unspecified: Secondary | ICD-10-CM | POA: Diagnosis not present

## 2019-01-25 DIAGNOSIS — R42 Dizziness and giddiness: Secondary | ICD-10-CM | POA: Diagnosis not present

## 2019-01-25 DIAGNOSIS — I959 Hypotension, unspecified: Secondary | ICD-10-CM | POA: Diagnosis not present

## 2019-01-25 DIAGNOSIS — J441 Chronic obstructive pulmonary disease with (acute) exacerbation: Secondary | ICD-10-CM | POA: Diagnosis not present

## 2019-01-25 DIAGNOSIS — J9601 Acute respiratory failure with hypoxia: Secondary | ICD-10-CM | POA: Diagnosis not present

## 2019-01-25 DIAGNOSIS — J449 Chronic obstructive pulmonary disease, unspecified: Secondary | ICD-10-CM | POA: Diagnosis not present

## 2019-01-25 DIAGNOSIS — Z720 Tobacco use: Secondary | ICD-10-CM | POA: Diagnosis not present

## 2019-01-25 DIAGNOSIS — G8929 Other chronic pain: Secondary | ICD-10-CM | POA: Diagnosis not present

## 2019-01-25 LAB — BASIC METABOLIC PANEL
Anion gap: 10 (ref 5–15)
BUN: 27 mg/dL — ABNORMAL HIGH (ref 8–23)
CO2: 34 mmol/L — ABNORMAL HIGH (ref 22–32)
Calcium: 9.3 mg/dL (ref 8.9–10.3)
Chloride: 97 mmol/L — ABNORMAL LOW (ref 98–111)
Creatinine, Ser: 0.86 mg/dL (ref 0.61–1.24)
GFR calc Af Amer: >60 mL/min (ref >60)
GFR calc non Af Amer: >60 mL/min (ref >60)
Glucose, Bld: 162 mg/dL — ABNORMAL HIGH (ref 70–99)
Potassium: 3.7 mmol/L (ref 3.5–5.1)
Sodium: 141 mmol/L (ref 135–145)

## 2019-01-25 MED ORDER — LACTULOSE 10 GM/15ML PO SOLN: 30.0000 g | Freq: Two times a day (BID) | ORAL | 4 refills | Status: AC

## 2019-01-25 MED ORDER — SPIRONOLACTONE 100 MG PO TABS: 100.0000 mg | ORAL_TABLET | Freq: Every day | ORAL | 2 refills | Status: AC

## 2019-01-25 MED ORDER — IPRATROPIUM-ALBUTEROL 0.5-2.5 (3) MG/3ML IN SOLN: 3.0000 mL | Freq: Four times a day (QID) | RESPIRATORY_TRACT | Status: AC | PRN

## 2019-01-25 MED ORDER — BUDESONIDE-FORMOTEROL FUMARATE 160-4.5 MCG/ACT IN AERO: 2.0000 | INHALATION_SPRAY | Freq: Two times a day (BID) | RESPIRATORY_TRACT | 12 refills | Status: DC

## 2019-01-25 MED ORDER — METOPROLOL SUCCINATE ER 25 MG PO TB24: 12.5000 mg | ORAL_TABLET | Freq: Every day | ORAL | 0 refills | Status: AC

## 2019-01-25 MED ORDER — PREDNISONE 10 MG PO TABS: ORAL_TABLET | ORAL | Status: AC

## 2019-01-25 MED ORDER — ALBUTEROL SULFATE HFA 108 (90 BASE) MCG/ACT IN AERS: 2.0000 | INHALATION_SPRAY | Freq: Four times a day (QID) | RESPIRATORY_TRACT | Status: AC | PRN

## 2019-01-25 MED ORDER — OXYCODONE HCL 15 MG PO TABS: ORAL_TABLET | ORAL | 0 refills | Status: DC

## 2019-01-25 MED ORDER — ALPRAZOLAM 2 MG PO TABS: 1.0000 mg | ORAL_TABLET | Freq: Three times a day (TID) | ORAL | 0 refills | Status: DC | PRN

## 2019-01-25 NOTE — TOC Transition Note (Signed)
Transition of Care [facility]) - CM/SW Discharge Note   Patient Details  Name: Andrew Carey MRN: [id number] Date of Birth: 05-Jan-1951  Transition of Care [facility] CM/SW Contact:  Ross Ludwig, LCSW Phone Number: 01/25/2019, 6:55 PM   Clinical Narrative:     CSW spoke with patient and his sister [name] to clarify how short term rehab works and how his insurance pays for it.  CSW explained the difference between hospice services and palliative services.  CSW informed patient and family the goal is short term rehab, and depending on how he does will determine how long he is there.  CSW explained how insurance pays for the stay, and what to expect at SNF.  CSW discussed with patient and his sister how insurance will determine it will pay for stay.  Patient and his sister were appreciative of information given and did not have any other questions or concerns.  Passar number was received back [id number] F expiration 02/24/2019.  Patient to be d/c'ed today to [facility].  Patient and family agreeable to plans will transport via ems RN to call report.      Final next level of care: Skilled Nursing Facility Barriers to Discharge: Barriers Resolved   Patient Goals and CMS Choice Patient states their goals for this hospitalization and ongoing recovery are:: To get some short term rehab, then move in with his sister. CMS Medicare.gov Compare Post Acute Care list provided to:: Patient Choice offered to / list presented to : Patient  Discharge Placement PASRR number recieved: 01/25/19            Patient chooses bed at: [facility] Patient to be transferred to facility by: [facility] Name of family member notified: Sister Toribio Harbour Patient and family notified of of transfer: 01/25/19  Discharge Plan and Services In-house Referral: Clinical Social Work   Post Acute Care Choice: [place]          DME Arranged: N/A DME Agency: NA      Representative spoke with at DME Agency: na   [place]: NA     Representative spoke with at [place]: na  Social Determinants of Health ([place]) Interventions     Readmission Risk Interventions Readmission Risk Prevention Plan 01/24/2019  Medication Screening Complete  Transportation Screening Complete  Some recent data might be hidden

## 2019-01-25 NOTE — Care Management Important Message (Signed)
Important Message  Patient Details  Name: Andrew Carey MRN: [id number] Date of Birth: 02-07-1951   Medicare Important Message Given:  Yes     Dannette Barbara 01/25/2019, 1:03 PM

## 2019-01-25 NOTE — Discharge Summary (Addendum)
[facility] NAME: Andrew Carey    MR#:  [id number]  DATE OF BIRTH:  1951-01-23  DATE OF ADMISSION:  01/20/2019 ADMITTING PHYSICIAN: Henreitta Leber, MD  DATE OF DISCHARGE: 01/25/2019  PRIMARY CARE PHYSICIAN: Birdie Sons, MD    ADMISSION DIAGNOSIS:  Dizziness [R42] Hypoxia 123456 Acute systolic congestive heart failure ([place]) [I50.21] AKI (acute kidney injury) ([place]) [N17.9]  DISCHARGE DIAGNOSIS:  Active Problems:   CHF (congestive heart failure) ([place])   SECONDARY DIAGNOSIS:   Past Medical History:  Diagnosis Date  . Allergy   . Arthritis   . Cirrhosis ([place])   . Colon polyps   . Colorectal cancer ([place])   . Depression   . Diverticulosis   . Liver lesion     HOSPITAL COURSE:   68 year old male with past medical history of alcohol abuse, liver cirrhosis, anxiety, COPD with ongoing tobacco abuse who presents to the hospital due to weakness, dizziness and noted to be in acute respiratory failure with hypoxia.  1. Acute respiratory failure with hypoxia-this was secondary to a combination of CHF and also underlying COPD exacerbation.  Patient was diuresed aggressively with IV Lasix and also given IV steroids scheduled duo nebs and Pulmicort nebs for his COPD. -Clinically patient has improved but still requires oxygen on minimal exertion.  Patient will be discharged on oxygen at 3 to 4 L nasal cannula continuously.  2. CHF-Acute systolic CHF.  Echo showing EF of 30-35%.   -Patient was diuresed with IV Lasix and responded well to it.  He is about 5 L negative since admission.  Volume status is significantly improved. - Patient will be discharged on oral Lasix daily along with Aldactone and low-dose beta-blocker.   he should adhere to a low-sodium diet. -Patient will benefit from outpatient sleep study as he likely has undiagnosed sleep apnea..  3. Elevated troponin-secondary to supply demand ischemia from hypoxemia  and also underlying CHF. -Patient's echocardiogram as mentioned above showed EF of 35 to 40% but no acute wall motion abnormalities.  This was likely secondary to demand ischemia.  4. COPD-mild acute exacerbation secondary to ongoing tobacco abuse.  -Patient had chest x-rays done during hospitalization which showed no evidence of acute pneumonia.  Patient was treated for the underlying CHF with IV diuretics and also treated for underlying COPD with IV steroids scheduled duo nebs, Pulmicort nebs.  Patient has improved still has some minimal wheezing on expiration and some exertional dyspnea which likely should improve over the next few weeks. -At present patient will be discharged a long prednisone taper, Symbicort, albuterol inhaler as needed and also duo nebs as needed.  Patient was strongly advised to abstain from smoking which will help him long-term.    5. Liver cirrhosis-secondary to alcohol abuse. -Initially when patient presented to the hospital his abdomen was distended and he was thought to have some ascites.  Ultrasound-guided paracentesis was ordered but the limited ultrasound did not show any evidence of ascites.  Patient was maintained on his diuretics as mentioned above and has clinically improved.  He will continue his Lasix and Aldactone -Patient had no clinical evidence of hepatic encephalopathy will continue his lactulose.  6. Neuropathy- he will continue gabapentin.  7. Anxiety- he will continue Xanax.  8. Chronic pain- he will continue oxycodone.  Due to patient's multiple comorbidities and significant deconditioning a physical therapy consult was obtained to assess his mobility.  After their evaluation they recommended short-term rehab which  the patient and patient's sister have now agreed to.  Patient's recent repeat COVID-19 test was negative.  He is being discharged with skilled nursing facility today.  Due to multiple comorbid conditions pt. Should have  Palliative care follow pt. At the facility.    DISCHARGE CONDITIONS:   Stable.   CONSULTS OBTAINED:    DRUG ALLERGIES:   Allergies  Allergen Reactions  . Fluoxetine     Hallucinations  . Codeine Nausea And Vomiting and Nausea Only  . Abilify [Aripiprazole]     Anxiety and panic attacks  . Trazodone And Nefazodone     Vivid dreams    DISCHARGE MEDICATIONS:   Allergies as of 01/25/2019      Reactions   Fluoxetine    Hallucinations   Codeine Nausea And Vomiting, Nausea Only   Abilify [aripiprazole]    Anxiety and panic attacks   Trazodone And Nefazodone    Vivid dreams      Medication List    STOP taking these medications   ondansetron 4 MG disintegrating tablet Commonly known as: Zofran ODT     TAKE these medications   albuterol 108 (90 Base) MCG/ACT inhaler Commonly known as: VENTOLIN HFA Inhale 2 puffs into the lungs every 6 (six) hours as needed for wheezing or shortness of breath.   alprazolam 2 MG tablet Commonly known as: XANAX Take 0.5-1 tablets (1-2 mg total) by mouth 3 (three) times daily as needed for anxiety.   budesonide-formoterol 160-4.5 MCG/ACT inhaler Commonly known as: SYMBICORT Inhale 2 puffs into the lungs 2 (two) times daily.   furosemide 40 MG tablet Commonly known as: LASIX Take 1 tablet (40 mg total) by mouth daily.   gabapentin 300 MG capsule Commonly known as: NEURONTIN One tablet at night for 3 nights, then one tablet twice daily for nerve pain in feet   ipratropium-albuterol 0.5-2.5 (3) MG/3ML Soln Commonly known as: DUONEB Take 3 mLs by nebulization every 6 (six) hours as needed.   lactulose 10 GM/15ML solution Commonly known as: CHRONULAC Take 45 mLs (30 g total) by mouth 2 (two) times daily. What changed: See the new instructions.   metoprolol succinate 25 MG 24 hr tablet Commonly known as: TOPROL-XL Take 0.5 tablets (12.5 mg total) by mouth daily.   oxyCODONE 15 MG immediate release tablet Commonly known as:  ROXICODONE take 1/2 tablets by mouth two to three times a day as needed   predniSONE 10 MG tablet Commonly known as: DELTASONE Label  & dispense according to the schedule below. 5 Pills PO for 2 days then, 4 Pills PO for 2 days, 3 Pills PO for 2 days, 2 Pills PO for 2 days, 1 Pill PO for 2 days then STOP.   spironolactone 100 MG tablet Commonly known as: ALDACTONE Take 1 tablet (100 mg total) by mouth daily. TAKE 2 TABLETS BY MOUTH ONCE DAILY What changed:   how much to take  how to take this  when to take this   thiamine 100 MG tablet Commonly known as: VITAMIN B-1 Take 100 mg by mouth daily.   vitamin B-12 100 MCG tablet Commonly known as: CYANOCOBALAMIN Take 100 mcg by mouth daily.         DISCHARGE INSTRUCTIONS:   DIET:  Cardiac diet  DISCHARGE CONDITION:  Stable  ACTIVITY:  Activity as tolerated  OXYGEN:  Home Oxygen: Yes.     Oxygen Delivery: 3 liters/min via Patient connected to nasal cannula oxygen  DISCHARGE LOCATION:  nursing home  If you experience worsening of your admission symptoms, develop shortness of breath, life threatening emergency, suicidal or homicidal thoughts you must seek medical attention immediately by calling 911 or calling your MD immediately  if symptoms less severe.  You Must read complete instructions/literature along with all the possible adverse reactions/side effects for all the Medicines you take and that have been prescribed to you. Take any new Medicines after you have completely understood and accpet all the possible adverse reactions/side effects.   Please note  You were cared for by a hospitalist during your hospital stay. If you have any questions about your discharge medications or the care you received while you were in the hospital after you are discharged, you can call the unit and asked to speak with the hospitalist on call if the hospitalist that took care of you is not available. Once you are discharged, your  primary care physician will handle any further medical issues. Please note that NO REFILLS for any discharge medications will be authorized once you are discharged, as it is imperative that you return to your primary care physician (or establish a relationship with a primary care physician if you do not have one) for your aftercare needs so that they can reassess your need for medications and monitor your lab values.     Today   Shortness of breath has significantly improved since admission.  Patient says this morning he feels the best he is been since he came to the hospital.  Still remains hypoxic on minimal exertion.  Will discharge to skilled nursing facility today.  VITAL SIGNS:  Blood pressure 134/89, pulse (!) 102, temperature (!) 97.5 F (36.4 C), temperature source Oral, resp. rate 20, height 6' (1.829 m), weight 118.2 kg, SpO2 (!) 89 %.  I/O:    Intake/Output Summary (Last 24 hours) at 01/25/2019 0908 Last data filed at 01/25/2019 0214 Gross per 24 hour  Intake 240 ml  Output 1000 ml  Net -760 ml    PHYSICAL EXAMINATION:   GENERAL:  68 y.o.-year-old patient lying in bed in NAD  EYES: Pupils equal, round, reactive to light and accommodation. No scleral icterus. Extraocular muscles intact.  HEENT: Head atraumatic, normocephalic. Oropharynx and nasopharynx clear.  NECK:  Supple, no jugular venous distention. No thyroid enlargement, no tenderness.  LUNGS: Good air entry bilaterally, End- exp. wheezing bilaterally, no rales, rhonchi.  Negative use of accessory muscles.  CARDIOVASCULAR: S1, S2 normal. No murmurs, rubs, or gallops.  ABDOMEN: Soft, nontender, nondistended. Bowel sounds present. No organomegaly or mass.  EXTREMITIES: No cyanosis, clubbing, + 1-2 edema b/l.  NEUROLOGIC: Cranial nerves II through XII are intact. No focal Motor or sensory deficits b/l. Globally weak.    PSYCHIATRIC: The patient is alert and oriented x 3.  SKIN: No obvious rash, lesion, or ulcer.     DATA REVIEW:   CBC Recent Labs  Lab 01/21/19 0607  WBC 9.1  HGB 15.1  HCT 46.3  PLT 159    Chemistries  Recent Labs  Lab 01/23/19 0442  01/25/19 0432  NA 139   < > 141  K 3.6   < > 3.7  CL 93*   < > 97*  CO2 34*   < > 34*  GLUCOSE 223*   < > 162*  BUN 16   < > 27*  CREATININE 0.67   < > 0.86  CALCIUM 9.0   < > 9.3  AST 19  --   --   ALT 21  --   --  ALKPHOS 86  --   --   BILITOT 1.0  --   --    < > = values in this interval not displayed.    Cardiac Enzymes No results for input(s): TROPONINI in the last 168 hours.  Microbiology Results  Results for orders placed or performed during the hospital encounter of 01/20/19  Novel Coronavirus, NAA (send-out to ref lab)     Status: None   Collection Time: 01/20/19 10:19 AM   Specimen: Nasopharyngeal Swab; Respiratory  Result Value Ref Range Status   SARS-CoV-2, NAA NOT DETECTED NOT DETECTED Final    Comment: (NOTE) This test was developed and its performance characteristics determined by [employer]. This test has not been FDA cleared or approved. This test has been authorized by FDA under an Emergency Use Authorization (EUA). This test is only authorized for the duration of time the declaration that circumstances exist justifying the authorization of the emergency use of in vitro diagnostic tests for detection of SARS-CoV-2 virus and/or diagnosis of COVID-19 infection under section 564(b)(1) of the Act, 21 U.S.C. KA:123727), unless the authorization is terminated or revoked sooner. When diagnostic testing is negative, the possibility of a false negative result should be considered in the context of a patient's recent exposures and the presence of clinical signs and symptoms consistent with COVID-19. An individual without symptoms of COVID-19 and who is not shedding SARS-CoV-2 virus would expect to have a negative (not detected) result in this assay. Performed  At: [facility] [address] [place], [place] [id number] Rush Farmer MD [id number]    [place]  Final    Comment: Performed at [facility], [address] [place], [place] [address]  SARS Coronavirus 2 [facility] order, Performed in [place] hospital lab)     Status: None   Collection Time: 01/24/19 11:02 AM  Result Value Ref Range Status   SARS Coronavirus 2 NEGATIVE NEGATIVE Final    Comment: (NOTE) If result is NEGATIVE SARS-CoV-2 target nucleic acids are NOT DETECTED. The SARS-CoV-2 RNA is generally detectable in upper and lower  respiratory specimens during the acute phase of infection. The lowest  concentration of SARS-CoV-2 viral copies this assay can detect is 250  copies / mL. A negative result does not preclude SARS-CoV-2 infection  and should not be used as the sole basis for treatment or other  patient management decisions.  A negative result may occur with  improper specimen collection / handling, submission of specimen other  than nasopharyngeal swab, presence of viral mutation(s) within the  areas targeted by this assay, and inadequate number of viral copies  (<250 copies / mL). A negative result must be combined with clinical  observations, patient history, and epidemiological information. If result is POSITIVE SARS-CoV-2 target nucleic acids are DETECTED. The SARS-CoV-2 RNA is generally detectable in upper and lower  respiratory specimens dur ing the acute phase of infection.  Positive  results are indicative of active infection with SARS-CoV-2.  Clinical  correlation with patient history and other diagnostic information is  necessary to determine patient infection status.  Positive results do  not rule out bacterial infection or co-infection with other viruses. If result is PRESUMPTIVE POSTIVE SARS-CoV-2 nucleic acids MAY BE PRESENT.   A presumptive positive result was obtained on the submitted specimen  and confirmed on repeat testing.  While  2019 novel coronavirus  (SARS-CoV-2) nucleic acids may be present in the submitted sample  additional confirmatory testing may be necessary for  epidemiological  and / or clinical management purposes  to differentiate between  SARS-CoV-2 and other Sarbecovirus currently known to infect humans.  If clinically indicated additional testing with an alternate test  methodology [phone]) is advised. The SARS-CoV-2 RNA is generally  detectable in upper and lower respiratory sp ecimens during the acute  phase of infection. The expected result is Negative. Fact Sheet for Patients:  StrictlyIdeas.no Fact Sheet for Healthcare Providers: BankingDealers.co.za This test is not yet approved or cleared by the [place] FDA and has been authorized for detection and/or diagnosis of SARS-CoV-2 by FDA under an Emergency Use Authorization (EUA).  This EUA will remain in effect (meaning this test can be used) for the duration of the COVID-19 declaration under Section 564(b)(1) of the Act, 21 U.S.C. section 360bbb-3(b)(1), unless the authorization is terminated or revoked sooner. Performed at [facility], [address] [place], [place] [address]     RADIOLOGY:  Dg Chest Port 1 View  Result Date: 01/23/2019 CLINICAL DATA:  Hypoxia. EXAM: PORTABLE CHEST 1 VIEW COMPARISON:  Radiograph of January 20, 2019. FINDINGS: Stable cardiomegaly. No pneumothorax is noted. Central pulmonary vascular congestion is noted. Small right pleural effusion may be present. Mild right basilar atelectasis may be present. Bony thorax is unremarkable. IMPRESSION: Stable cardiomegaly with central pulmonary vascular congestion. Mild right basilar subsegmental atelectasis may be present with small right pleural effusion. Electronically Signed   By: Marijo Conception M.D.   On: 01/23/2019 13:49      Management plans discussed with the patient, family and they are in  agreement.  CODE STATUS:     Code Status Orders  (From admission, onward)         Start     Ordered   01/20/19 1501  Do not attempt resuscitation (DNR)  Continuous    Question Answer Comment  In the event of cardiac or respiratory ARREST Do not call a "code blue"   In the event of cardiac or respiratory ARREST Do not perform Intubation, CPR, defibrillation or ACLS   In the event of cardiac or respiratory ARREST Use medication by any route, position, wound care, and other measures to relive pain and suffering. May use oxygen, suction and manual treatment of airway obstruction as needed for comfort.      01/20/19 1500        TOTAL TIME TAKING CARE OF THIS PATIENT: 45 minutes.    Henreitta Leber M.D on 01/25/2019 at 9:08 AM  Between 7am to 6pm - Pager - [phone]  After 6pm go to [email or web address] - Proofreader  [facility]  Office  [phone]  CC: Primary care physician; Birdie Sons, MD

## 2019-01-25 NOTE — Progress Notes (Signed)
IV and tele removed from patient. Discharge instructions given to patient and placed in packet for receiving facility. Verbalized understanding. Report called to [facility]. EMS called for transportation. Patient is awaiting. No acute distress at this time.

## 2019-01-27 NOTE — Progress Notes (Deleted)
   Patient ID: Andrew Carey, male    DOB: 10/06/1950, 68 y.o.   MRN: [id number]  HPI  Andrew Carey is a 68 y/o male with a history of  Echo report from 01/21/2019 reviewed and showed an EF of 30-35% along with severe AS.   Admitted 01/20/2019 due to HF/ COPD exacerbation. Given IV lasix/ solumedrol and then transitioned to oral medications. Unable to get patient off of oxygen via nasal cannula. Elevated troponin thought to be due to demand ischemia. Discharged to SNF after 5 days.   He presents today for his initial visit with a chief complaint of   Review of Systems    Physical Exam    Assessment & Plan:  1: Chronic heart failure with reduced ejection fraction- - NYHA class - BNP 01/20/2019 was 1324.0  2: COPD- - BMP 01/25/2019 reviewed and showed sodium 141, potassium 3.7, creatinine 0.86 and GFR >60  3: Liver cirrohsis- - saw PCP Andrew Carey) 04/27/18

## 2019-01-28 ENCOUNTER — Ambulatory Visit: Payer: Medicare Other | Admitting: Family

## 2019-01-28 ENCOUNTER — Telehealth: Payer: Self-pay | Admitting: Family

## 2019-01-28 NOTE — Telephone Encounter (Signed)
Patient did not show for his [facility] appointment on 01/28/2019. Will attempt to reschedule.

## 2019-01-29 ENCOUNTER — Telehealth: Payer: Self-pay

## 2019-01-29 DIAGNOSIS — J9610 Chronic respiratory failure, unspecified whether with hypoxia or hypercapnia: Secondary | ICD-10-CM | POA: Diagnosis not present

## 2019-01-29 DIAGNOSIS — I5020 Unspecified systolic (congestive) heart failure: Secondary | ICD-10-CM | POA: Diagnosis not present

## 2019-01-29 DIAGNOSIS — J449 Chronic obstructive pulmonary disease, unspecified: Secondary | ICD-10-CM | POA: Diagnosis not present

## 2019-01-29 DIAGNOSIS — R5381 Other malaise: Secondary | ICD-10-CM | POA: Diagnosis not present

## 2019-01-29 NOTE — Telephone Encounter (Signed)
FYI: Patient was on the schedule tomorrow 01/30/2019 for hospital follow up. I called patient to do pre screening, and patient advised me that he was still in rehabilitation at the skilled nursing facility. Patient will not be able to come to appointment tomorrow, so I cancelled it. Patient plans to call back to reschedule appointment after he is discharged.

## 2019-01-30 ENCOUNTER — Inpatient Hospital Stay: Payer: Self-pay | Admitting: Family Medicine

## 2019-02-05 DIAGNOSIS — M1990 Unspecified osteoarthritis, unspecified site: Secondary | ICD-10-CM | POA: Diagnosis not present

## 2019-02-05 DIAGNOSIS — G8929 Other chronic pain: Secondary | ICD-10-CM | POA: Diagnosis not present

## 2019-02-05 DIAGNOSIS — J9621 Acute and chronic respiratory failure with hypoxia: Secondary | ICD-10-CM | POA: Diagnosis not present

## 2019-02-05 DIAGNOSIS — I5020 Unspecified systolic (congestive) heart failure: Secondary | ICD-10-CM | POA: Diagnosis not present

## 2019-02-05 DIAGNOSIS — J441 Chronic obstructive pulmonary disease with (acute) exacerbation: Secondary | ICD-10-CM | POA: Diagnosis not present

## 2019-02-05 DIAGNOSIS — K5730 Diverticulosis of large intestine without perforation or abscess without bleeding: Secondary | ICD-10-CM | POA: Diagnosis not present

## 2019-02-08 ENCOUNTER — Telehealth: Payer: Self-pay

## 2019-02-08 DIAGNOSIS — G8929 Other chronic pain: Secondary | ICD-10-CM | POA: Diagnosis not present

## 2019-02-08 DIAGNOSIS — K5730 Diverticulosis of large intestine without perforation or abscess without bleeding: Secondary | ICD-10-CM | POA: Diagnosis not present

## 2019-02-08 DIAGNOSIS — J441 Chronic obstructive pulmonary disease with (acute) exacerbation: Secondary | ICD-10-CM | POA: Diagnosis not present

## 2019-02-08 DIAGNOSIS — M1990 Unspecified osteoarthritis, unspecified site: Secondary | ICD-10-CM | POA: Diagnosis not present

## 2019-02-08 DIAGNOSIS — I5020 Unspecified systolic (congestive) heart failure: Secondary | ICD-10-CM | POA: Diagnosis not present

## 2019-02-08 DIAGNOSIS — J9621 Acute and chronic respiratory failure with hypoxia: Secondary | ICD-10-CM | POA: Diagnosis not present

## 2019-02-08 NOTE — Telephone Encounter (Signed)
Carlota Raspberry, RN @ [facility] needs verbal orders for nursing care twice a week for one week, then one time a week for three weeks. [phone].

## 2019-02-08 NOTE — Telephone Encounter (Signed)
Please review. Thanks!  

## 2019-02-08 NOTE — Telephone Encounter (Signed)
Left message to call back  

## 2019-02-08 NOTE — Telephone Encounter (Signed)
That's fine

## 2019-02-11 ENCOUNTER — Telehealth: Payer: Self-pay | Admitting: Family Medicine

## 2019-02-11 NOTE — Telephone Encounter (Signed)
Okay to schedule? KW

## 2019-02-11 NOTE — Telephone Encounter (Signed)
That's fine, but he talks a long time. Should either be a 40 minutes slot, or the last slot in the morning or afternoon.

## 2019-02-11 NOTE — Telephone Encounter (Signed)
Pt has had to cancel his hospital f/u appt due to his mother passing.  Having a hard time gettingout of the home to come to a visit.  Wanting to have a telephone visit if ok with Dr. [name].  Please call pt back to let him know if this will be ok.  Thanks, [employer]

## 2019-02-11 NOTE — Telephone Encounter (Signed)
Unable to reach patient at this time voicemailbox is not set up yet. KW

## 2019-02-11 NOTE — Telephone Encounter (Signed)
Verbal orders given  

## 2019-02-12 NOTE — Telephone Encounter (Signed)
Telephone Appointment scheduled Friday 02/15/2019 ar 3pm.

## 2019-02-13 DIAGNOSIS — M1990 Unspecified osteoarthritis, unspecified site: Secondary | ICD-10-CM | POA: Diagnosis not present

## 2019-02-13 DIAGNOSIS — K5730 Diverticulosis of large intestine without perforation or abscess without bleeding: Secondary | ICD-10-CM | POA: Diagnosis not present

## 2019-02-13 DIAGNOSIS — J9621 Acute and chronic respiratory failure with hypoxia: Secondary | ICD-10-CM | POA: Diagnosis not present

## 2019-02-13 DIAGNOSIS — I5020 Unspecified systolic (congestive) heart failure: Secondary | ICD-10-CM | POA: Diagnosis not present

## 2019-02-13 DIAGNOSIS — J441 Chronic obstructive pulmonary disease with (acute) exacerbation: Secondary | ICD-10-CM | POA: Diagnosis not present

## 2019-02-13 DIAGNOSIS — G8929 Other chronic pain: Secondary | ICD-10-CM | POA: Diagnosis not present

## 2019-02-14 ENCOUNTER — Telehealth: Payer: Self-pay

## 2019-02-14 NOTE — Telephone Encounter (Signed)
[facility] called requesting verbal order for a social worker to help patient out with drinking and smoking cessation. Patient would like help this these things. Please call Larena Glassman back at [phone].

## 2019-02-14 NOTE — Telephone Encounter (Signed)
Larena Glassman advised  Thanks,   -Mickel Baas

## 2019-02-14 NOTE — Telephone Encounter (Signed)
That's fine

## 2019-02-15 ENCOUNTER — Other Ambulatory Visit: Payer: Self-pay

## 2019-02-15 ENCOUNTER — Ambulatory Visit (INDEPENDENT_AMBULATORY_CARE_PROVIDER_SITE_OTHER): Payer: Medicare Other | Admitting: Family Medicine

## 2019-02-15 DIAGNOSIS — G8929 Other chronic pain: Secondary | ICD-10-CM | POA: Diagnosis not present

## 2019-02-15 DIAGNOSIS — J9621 Acute and chronic respiratory failure with hypoxia: Secondary | ICD-10-CM | POA: Diagnosis not present

## 2019-02-15 DIAGNOSIS — J441 Chronic obstructive pulmonary disease with (acute) exacerbation: Secondary | ICD-10-CM

## 2019-02-15 DIAGNOSIS — F4323 Adjustment disorder with mixed anxiety and depressed mood: Secondary | ICD-10-CM | POA: Diagnosis not present

## 2019-02-15 DIAGNOSIS — I509 Heart failure, unspecified: Secondary | ICD-10-CM

## 2019-02-15 DIAGNOSIS — M1990 Unspecified osteoarthritis, unspecified site: Secondary | ICD-10-CM | POA: Diagnosis not present

## 2019-02-15 DIAGNOSIS — I5020 Unspecified systolic (congestive) heart failure: Secondary | ICD-10-CM | POA: Diagnosis not present

## 2019-02-15 DIAGNOSIS — K5730 Diverticulosis of large intestine without perforation or abscess without bleeding: Secondary | ICD-10-CM | POA: Diagnosis not present

## 2019-02-15 MED ORDER — ALPRAZOLAM 1 MG PO TABS: 1.0000 mg | ORAL_TABLET | Freq: Three times a day (TID) | ORAL | 3 refills | Status: AC | PRN

## 2019-02-15 MED ORDER — ADVAIR HFA 115-21 MCG/ACT IN AERO: 2.0000 | INHALATION_SPRAY | Freq: Two times a day (BID) | RESPIRATORY_TRACT | 12 refills | Status: AC

## 2019-02-15 NOTE — Progress Notes (Signed)
Patient: Andrew Carey Male    DOB: 1950-07-03   68 y.o.   MRN: [id number] Visit Date: 02/15/2019  Today's Provider: Lelon Huh, MD    Subjective:  Virtual Visit via Telephone Note  I connected with Andrew Carey on 02/15/19 at  3:00 PM EDT by telephone and verified that I am speaking with the correct person using two identifiers.     History of Present Illness:   Follow up Hospitalization  Patient was admitted to [facility] on 01/20/2019 and discharged on 01/25/2019. He was treated for Dizziness, AKI, and CHF. Treatment for this included VI diuresis, IV steroids, prednisone and metoprolol 1/2 tablet daily (12.5mg  daily) furosemide and 200mg  spironolactone daily.  He reports good compliance with treatment. He reports this condition is Improved  Was also discharged on prednisone taper and Symbicort inhaler which had $140 copy for COPD exacerbation.   Right foot is still moderately swollen.      Allergies  Allergen Reactions  . Fluoxetine     Hallucinations  . Codeine Nausea And Vomiting and Nausea Only  . Abilify [Aripiprazole]     Anxiety and panic attacks  . Trazodone And Nefazodone     Vivid dreams     Current Outpatient Medications:  .  albuterol (VENTOLIN HFA) 108 (90 Base) MCG/ACT inhaler, Inhale 2 puffs into the lungs every 6 (six) hours as needed for wheezing or shortness of breath., Disp: , Rfl:  .  alprazolam (XANAX) 2 MG tablet, Take 0.5-1 tablets (1-2 mg total) by mouth 3 (three) times daily as needed for anxiety., Disp: 30 tablet, Rfl: 0 .  budesonide-formoterol (SYMBICORT) 160-4.5 MCG/ACT inhaler, Inhale 2 puffs into the lungs 2 (two) times daily., Disp: 1 Inhaler, Rfl: 12 .  furosemide (LASIX) 40 MG tablet, Take 1 tablet (40 mg total) by mouth daily., Disp: 60 tablet, Rfl: 5 .  gabapentin (NEURONTIN) 300 MG capsule, One tablet at night for 3 nights, then one tablet twice daily for nerve pain in feet, Disp: 60 capsule, Rfl: 3 .   ipratropium-albuterol (DUONEB) 0.5-2.5 (3) MG/3ML SOLN, Take 3 mLs by nebulization every 6 (six) hours as needed., Disp: 360 mL, Rfl:  .  lactulose (CHRONULAC) 10 GM/15ML solution, Take 45 mLs (30 g total) by mouth 2 (two) times daily., Disp: 1800 mL, Rfl: 4 .  metoprolol succinate (TOPROL-XL) 25 MG 24 hr tablet, Take 0.5 tablets (12.5 mg total) by mouth daily., Disp: 15 tablet, Rfl: 0 .  oxyCODONE (ROXICODONE) 15 MG immediate release tablet, take 1/2 tablets by mouth two to three times a day as needed, Disp: 20 tablet, Rfl: 0 .  predniSONE (DELTASONE) 10 MG tablet, Label  & dispense according to the schedule below. 5 Pills PO for 2 days then, 4 Pills PO for 2 days, 3 Pills PO for 2 days, 2 Pills PO for 2 days, 1 Pill PO for 2 days then STOP., Disp: , Rfl:  .  spironolactone (ALDACTONE) 100 MG tablet, Take 1 tablet (100 mg total) by mouth daily. TAKE 2 TABLETS BY MOUTH ONCE DAILY, Disp: 180 tablet, Rfl: 2 .  thiamine (VITAMIN B-1) 100 MG tablet, Take 100 mg by mouth daily., Disp: , Rfl: 2 .  vitamin B-12 (CYANOCOBALAMIN) 100 MCG tablet, Take 100 mcg by mouth daily., Disp: , Rfl:   Review of Systems  Constitutional: Negative for appetite change, chills and fever.  Respiratory: Negative for chest tightness, shortness of breath and wheezing.   Cardiovascular: Negative for chest pain  and palpitations.  Gastrointestinal: Negative for abdominal pain, nausea and vomiting.    Social History   Tobacco Use  . Smoking status: Current Every Day Smoker    Packs/day: 1.00    Years: 50.00    Pack years: 50.00    Types: Cigarettes  . Smokeless tobacco: Never Used  Substance Use Topics  . Alcohol use: Yes    Alcohol/week: 28.0 standard drinks    Types: 28 Cans of beer per week    Frequency: Never    Comment: 3-6 beer daily for the past 40-50 yrs.       Objective:   There were no vitals taken for this visit. There were no vitals filed for this visit.There is no height or weight on file to calculate  BMI.   Physical Exam  Awake, alert, oriented x 3. In no distress.  No results found for any visits on 02/15/19.     Assessment & Plan     1. Congestive heart failure, unspecified HF chronicity, unspecified heart failure type ([place]) Greatly improved since discharge. May reduce spironolactone to 100mg  daily considering history of severe hypotension on this medication in the past. Can increase to 200 if he notices any increase abdominal or LE edema.   2. COPD exacerbation (HCC) Change Symbicort to- fluticasone-salmeterol (ADVAIR HFA) 115-21 MCG/ACT inhaler; Inhale 2 puffs into the lungs 2 (two) times daily.  Dispense: 1 Inhaler; Refill: 12 Due to formulary coverage  3. Adjustment disorder with mixed anxiety and depressed mood Refill  - ALPRAZolam (XANAX) 1 MG tablet; Take 1 tablet (1 mg total) by mouth 3 (three) times daily as needed for anxiety.  Dispense: 60 tablet; Refill: 3   Follow Up Instructions:    I discussed the assessment and treatment plan with the patient. The patient was provided an opportunity to ask questions and all were answered. The patient agreed with the plan and demonstrated an understanding of the instructions.   The patient was advised to call back or seek an in-person evaluation if the symptoms worsen or if the condition fails to improve as anticipated.  I provided  20 minutes of non-face-to-face time during this encounter.       Lelon Huh, MD  [facility]

## 2019-02-15 NOTE — Patient Instructions (Signed)
.   Please review the attached list of medications and notify my office if there are any errors.   . Please bring all of your medications to every appointment so we can make sure that our medication list is the same as yours.   . It is especially important to get the annual flu vaccine this year. If you haven't had it already, please go to your pharmacy or call the office as soon as possible to schedule you flu shot.  

## 2019-02-19 ENCOUNTER — Other Ambulatory Visit: Payer: Self-pay | Admitting: Family Medicine

## 2019-02-19 DIAGNOSIS — J9621 Acute and chronic respiratory failure with hypoxia: Secondary | ICD-10-CM | POA: Diagnosis not present

## 2019-02-19 DIAGNOSIS — M1990 Unspecified osteoarthritis, unspecified site: Secondary | ICD-10-CM | POA: Diagnosis not present

## 2019-02-19 DIAGNOSIS — G8929 Other chronic pain: Secondary | ICD-10-CM

## 2019-02-19 DIAGNOSIS — K5730 Diverticulosis of large intestine without perforation or abscess without bleeding: Secondary | ICD-10-CM | POA: Diagnosis not present

## 2019-02-19 DIAGNOSIS — J441 Chronic obstructive pulmonary disease with (acute) exacerbation: Secondary | ICD-10-CM | POA: Diagnosis not present

## 2019-02-19 DIAGNOSIS — I5020 Unspecified systolic (congestive) heart failure: Secondary | ICD-10-CM | POA: Diagnosis not present

## 2019-02-19 MED ORDER — OXYCODONE HCL 15 MG PO TABS: 15.0000 mg | ORAL_TABLET | Freq: Three times a day (TID) | ORAL | 0 refills | Status: AC | PRN

## 2019-02-19 NOTE — Telephone Encounter (Signed)
Pt called needing refill on Oxycodone 15 mg  [facility]

## 2019-02-28 DEATH — deceased

## 2019-03-05 ENCOUNTER — Other Ambulatory Visit: Payer: Self-pay | Admitting: Family Medicine

## 2020-06-01 IMAGING — CT CT HEAD WITHOUT CONTRAST
4 of 8 series · 13 of 47 positions shown, 15 images · non-contrast
Comparison: 08/21/2006

CLINICAL DATA: Pt arrives via EMS from [facility] for
complaint of dizziness and fall, c/o of hitting his head on the
sink. History of liver cirrhosis, COPD, CHF and anxiety disorder.
Reports taking oxycodone this morning.

EXAM:
CT HEAD WITHOUT CONTRAST
TECHNIQUE: Contiguous axial images were obtained from the base of the skull
through the vertex without intravenous contrast.

[Series 3: head wo · axial · 0.46mm/px · z∈[-128,-8]mm · 6 of 34 slices shown, 8 images]
[im 5/34  brain]
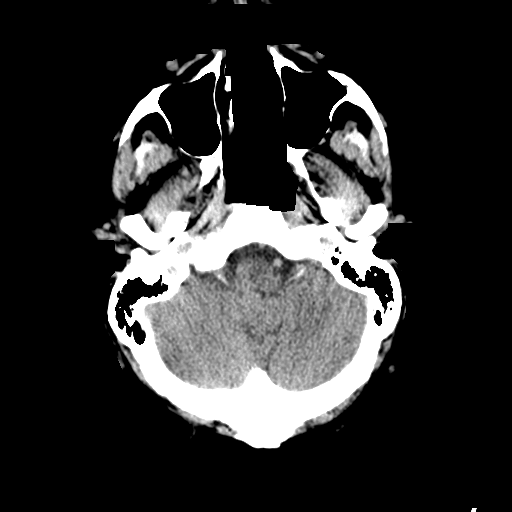
[im 5/34  bone]
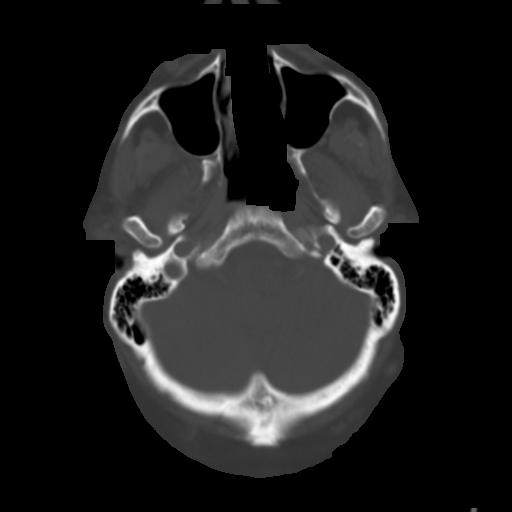
[im 10/34  brain]
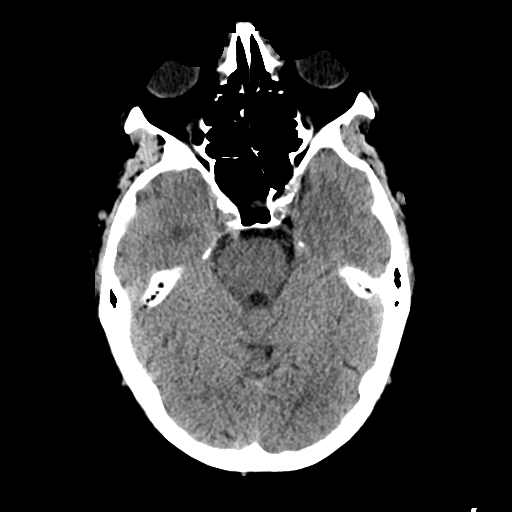
[im 15/34  brain]
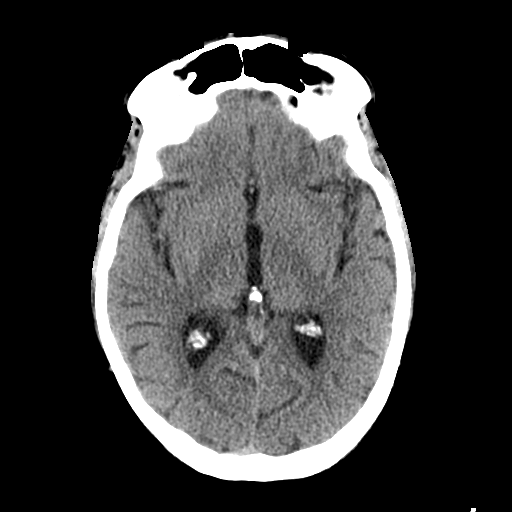
[im 19/34  brain]
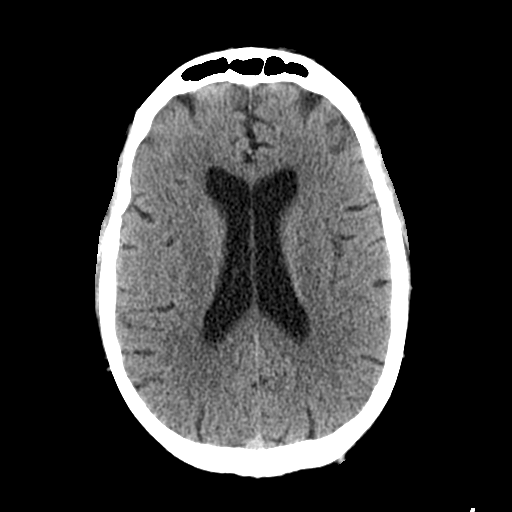
[im 24/34  brain]
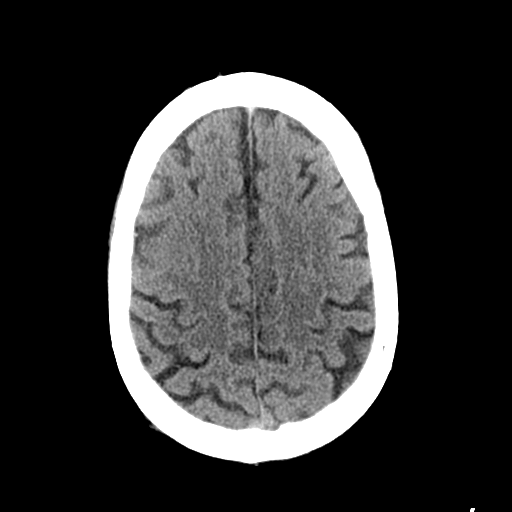
[im 24/34  bone]
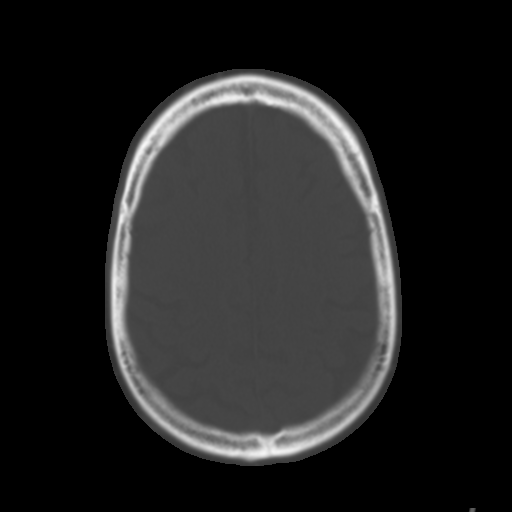
[im 29/34  brain]
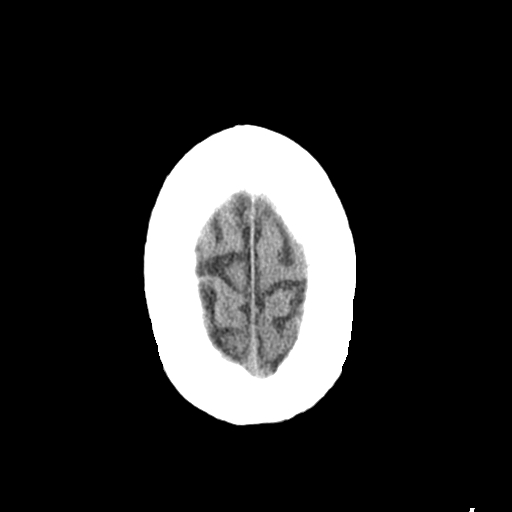

[Series 4: coronal soft tissue · coronal · 0.33mm/px · 3 of 71 slices shown]
[im 22/71  brain]
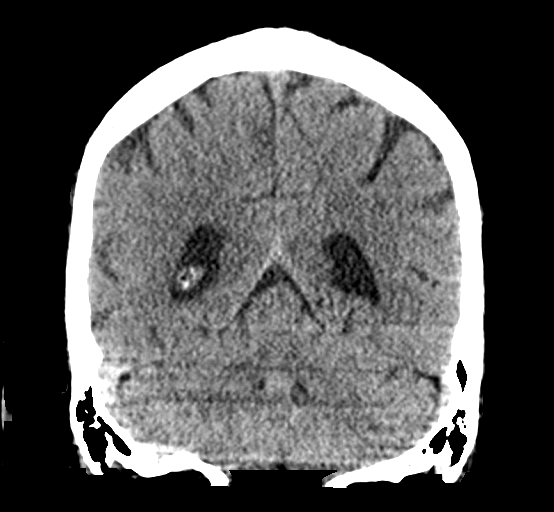
[im 29/71  brain]
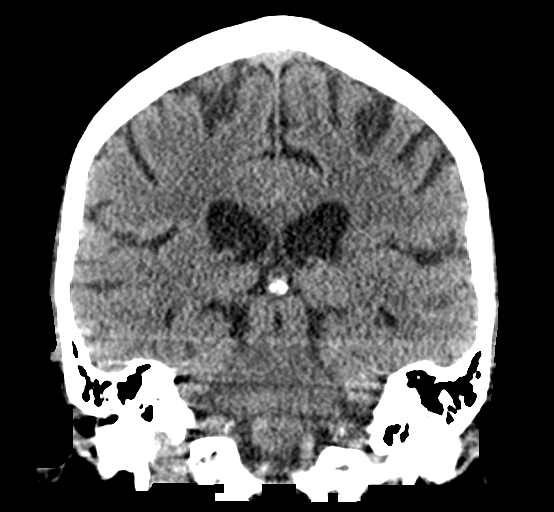
[im 36/71  brain]
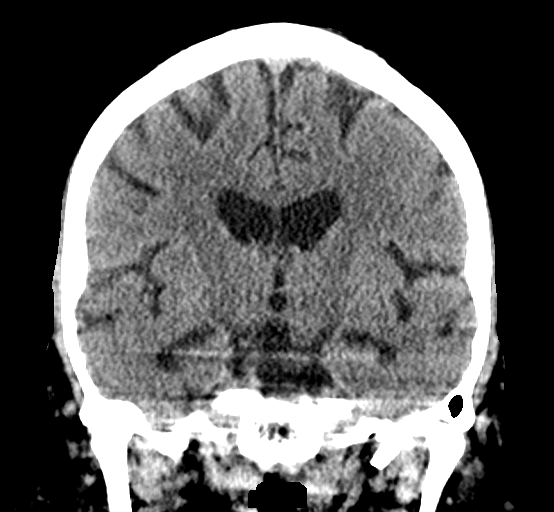

[Series 6: ax head wo · axial · 0.36mm/px · z∈[-120,-95]mm · 2 of 17 slices shown]
[im 6/17  brain]
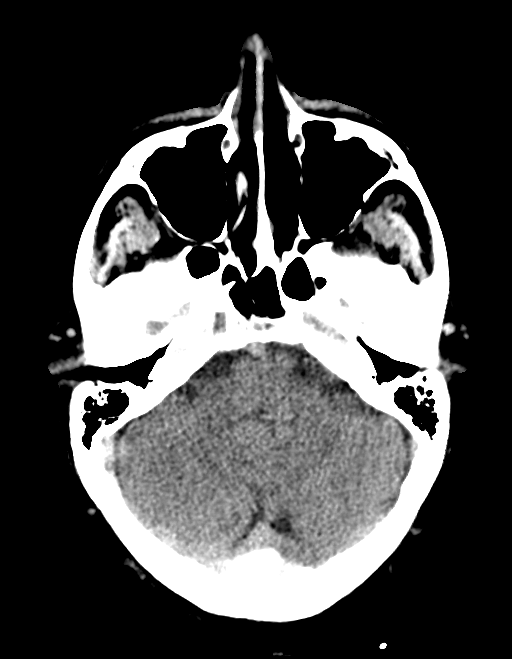
[im 11/17  brain]
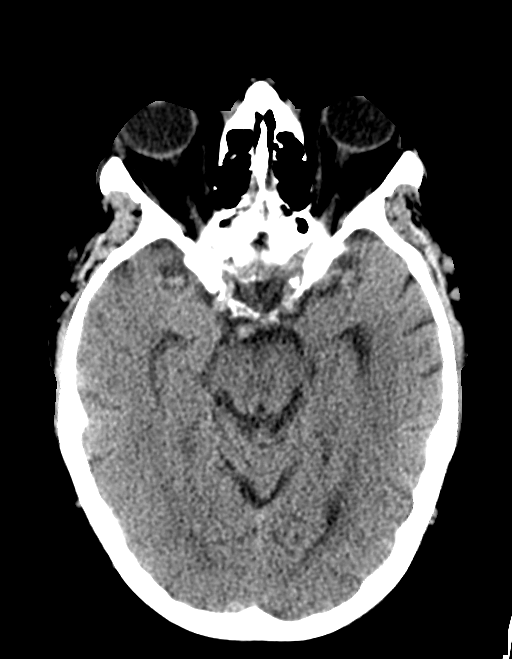

[Series 8: sagittal soft tissue · sagittal · 0.20mm/px · 2 of 56 slices shown]
[im 19/56  brain]
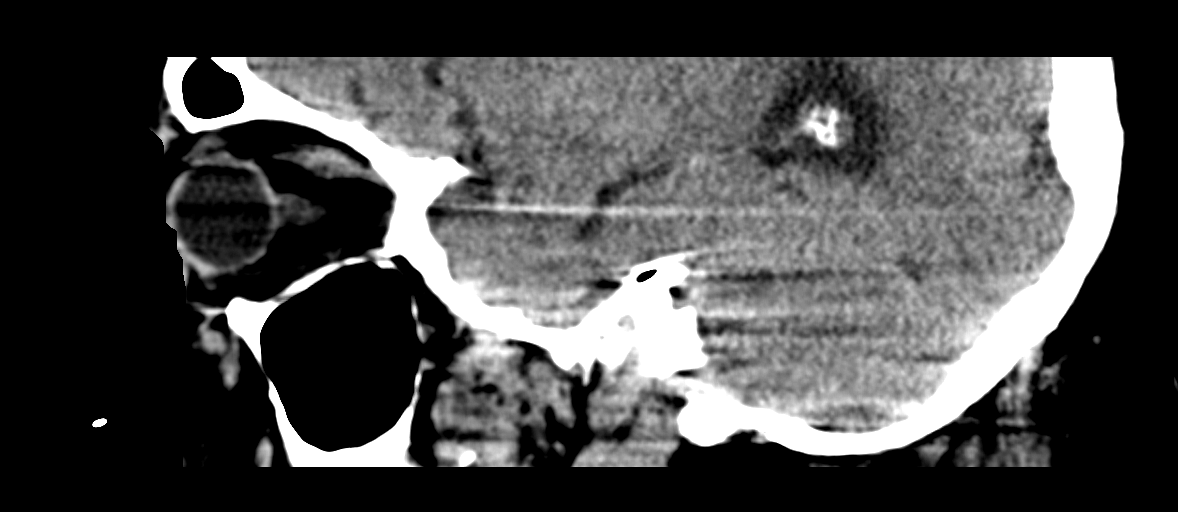
[im 37/56  brain]
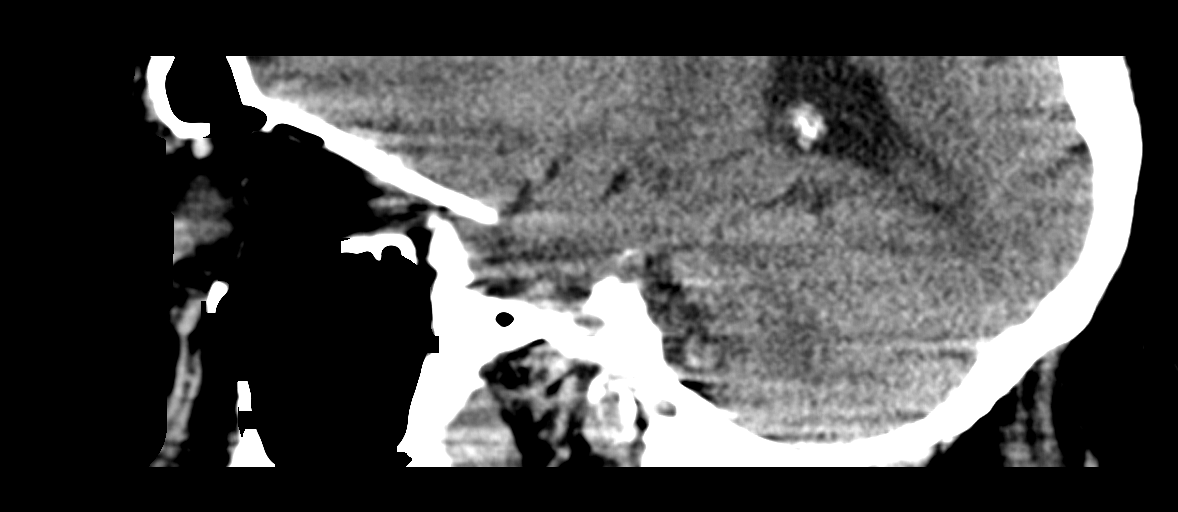

[13 of 47 positions shown; findings below may reference images not displayed]

FINDINGS: Brain: There is mild central and cortical atrophy. There is no intra
or extra-axial fluid collection or mass lesion. The basilar cisterns
and ventricles have a normal appearance. There is no CT evidence for
acute infarction or hemorrhage.

Vascular: There is atherosclerotic calcification of the internal
carotid arteries. No hyperdense vessels.

Skull: Normal. Negative for fracture or focal lesion.

Sinuses/Orbits: No acute finding.

Other: There is focal scalp edema in the LEFT posterior parietal
region without underlying calvarial fracture.
IMPRESSION: 1. No evidence for acute intracranial abnormality.
2. Mild atrophy.
3. LEFT posterior parietal scalp edema.

## 2020-06-02 IMAGING — US ULTRASOUND ABDOMEN LIMITED
1 series · 6 of 6 positions shown · non-contrast
Comparison: 01/11/2016

CLINICAL DATA: Abdominal distention.  Evaluate for ascites.

EXAM:
LIMITED ABDOMEN ULTRASOUND FOR ASCITES
TECHNIQUE: Limited ultrasound survey for ascites was performed in all four
abdominal quadrants.

[Series 1: ultrasound abdomen limited · 0.30mm/px · 6 of 6 slices shown]
[im 1/6]
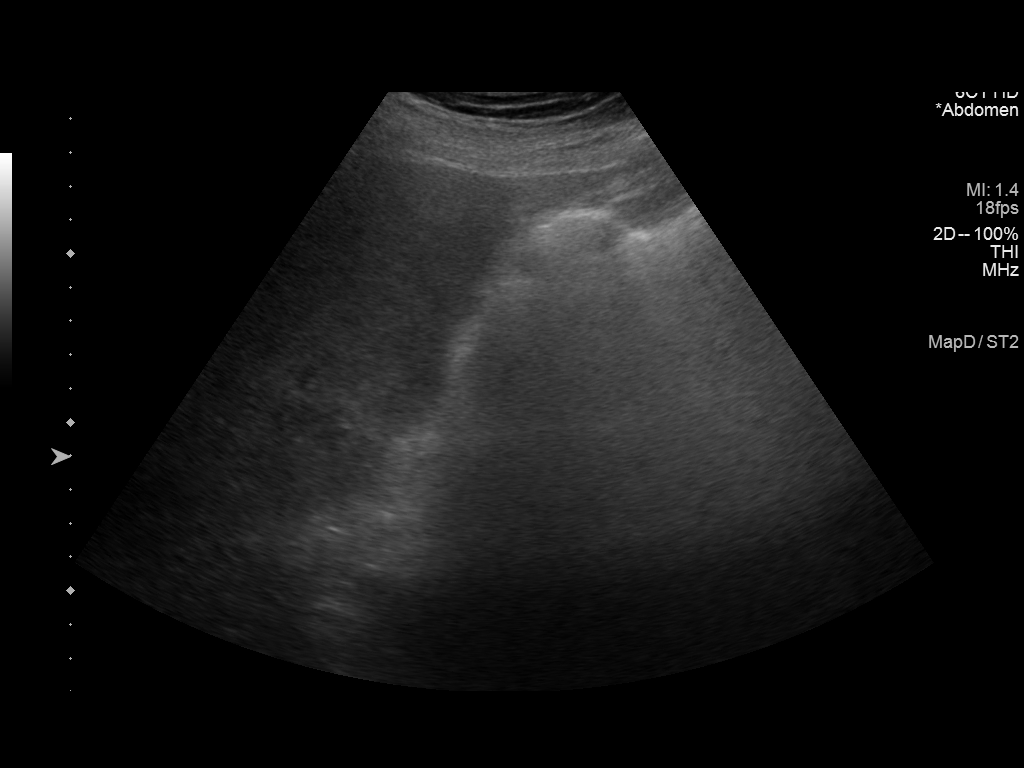
[im 2/6]
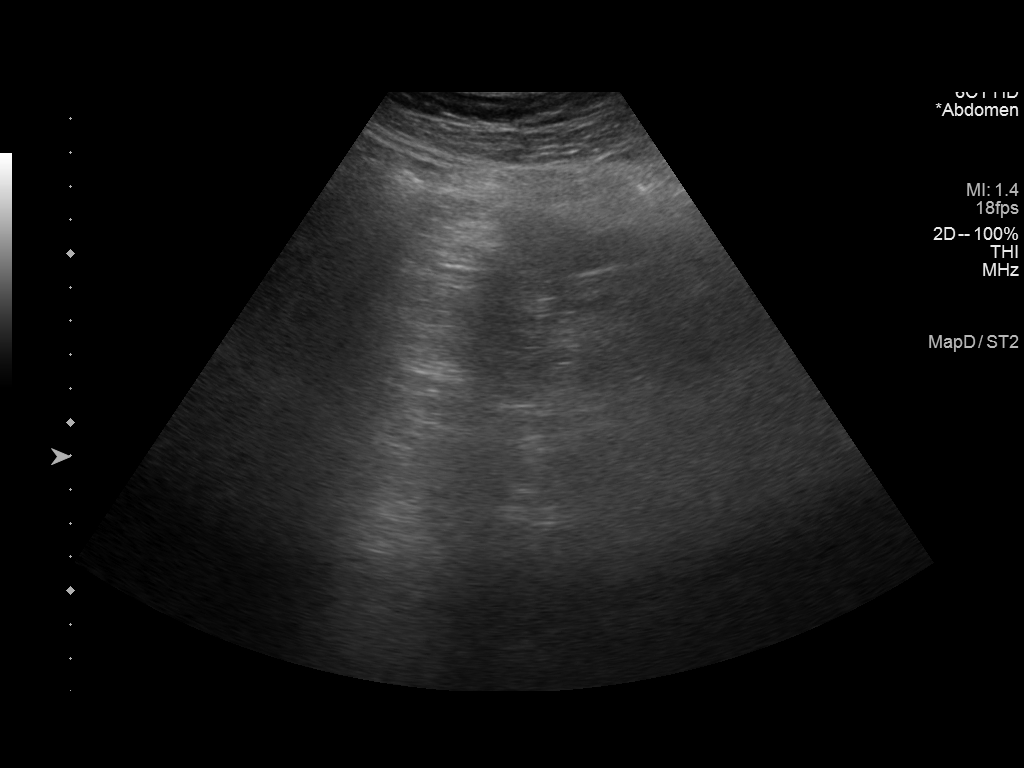
[im 3/6]
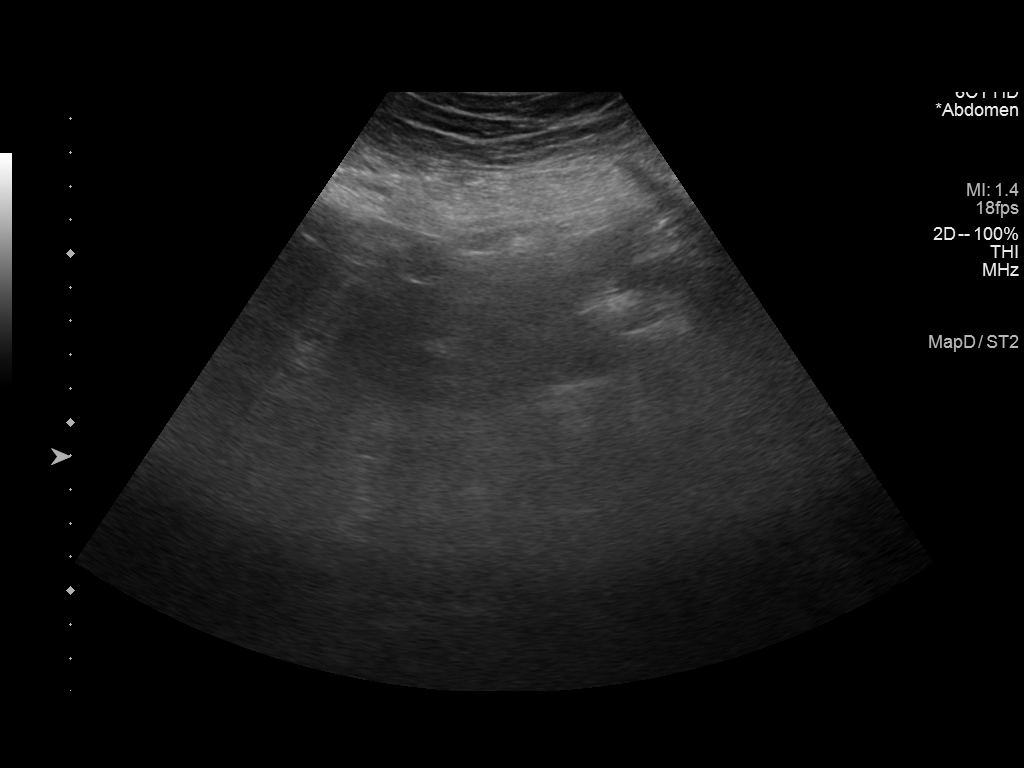
[im 4/6]
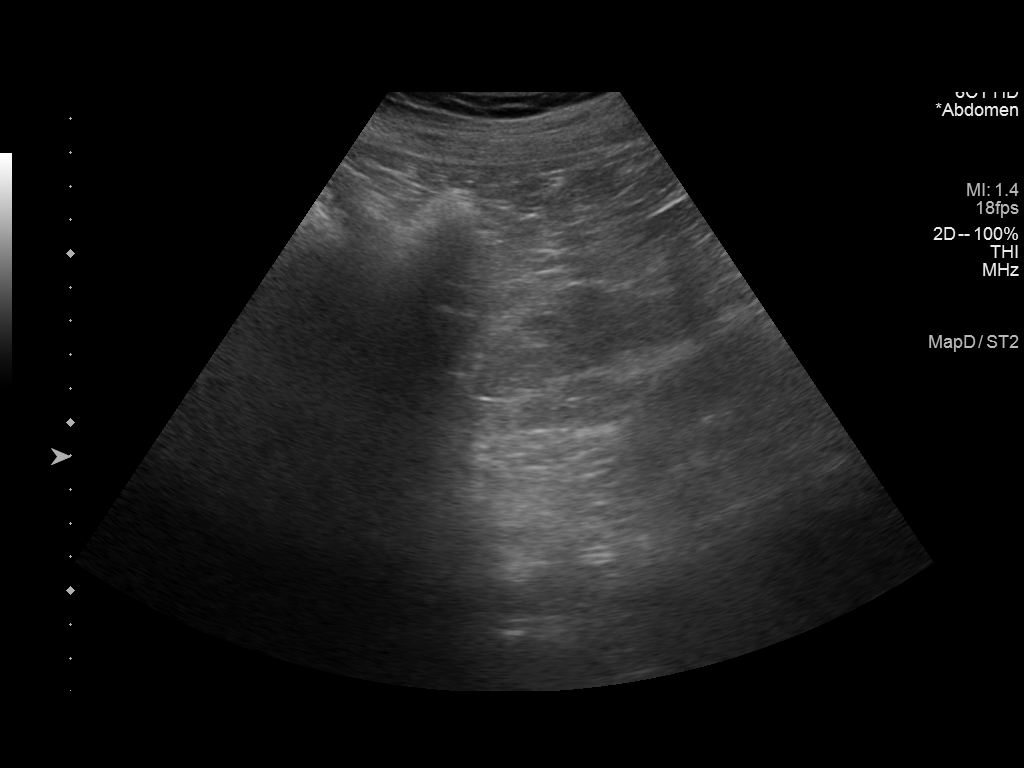
[im 5/6]
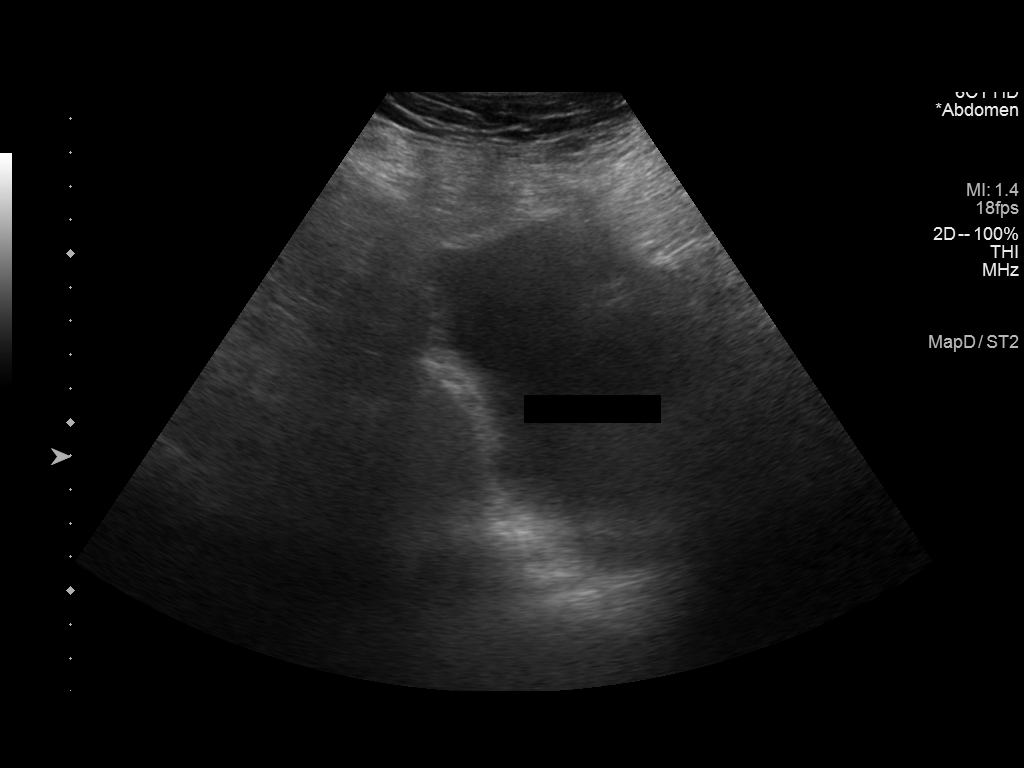
[im 6/6]
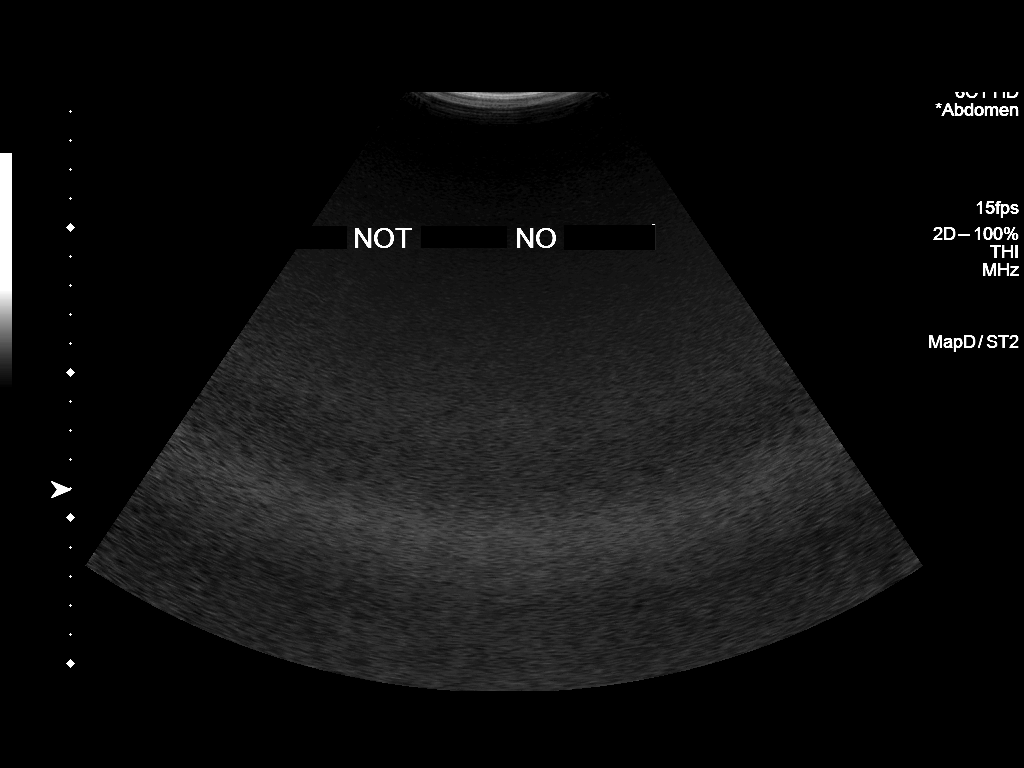

[6 of 6 positions shown; findings below may reference images not displayed]

FINDINGS: No ascites.  Fluid in the urinary bladder.
IMPRESSION: Negative for ascites.
